# Patient Record
Sex: Female | Born: 1960 | Race: White | Hispanic: No | Marital: Married | Smoking: Never smoker
Health system: Southern US, Community
[De-identification: ages and names within clinical notes are randomized; demographics above are authoritative.]

## PROBLEM LIST (undated history)

## (undated) DIAGNOSIS — J45909 Unspecified asthma, uncomplicated: Secondary | ICD-10-CM

## (undated) DIAGNOSIS — R002 Palpitations: Secondary | ICD-10-CM

## (undated) DIAGNOSIS — R0989 Other specified symptoms and signs involving the circulatory and respiratory systems: Secondary | ICD-10-CM

## (undated) DIAGNOSIS — R0609 Other forms of dyspnea: Secondary | ICD-10-CM

## (undated) DIAGNOSIS — R000 Tachycardia, unspecified: Secondary | ICD-10-CM

## (undated) DIAGNOSIS — Z9889 Other specified postprocedural states: Secondary | ICD-10-CM

## (undated) DIAGNOSIS — E785 Hyperlipidemia, unspecified: Secondary | ICD-10-CM

## (undated) DIAGNOSIS — C801 Malignant (primary) neoplasm, unspecified: Secondary | ICD-10-CM

## (undated) DIAGNOSIS — M940 Chondrocostal junction syndrome [Tietze]: Secondary | ICD-10-CM

## (undated) DIAGNOSIS — R112 Nausea with vomiting, unspecified: Secondary | ICD-10-CM

## (undated) DIAGNOSIS — C4491 Basal cell carcinoma of skin, unspecified: Secondary | ICD-10-CM

## (undated) HISTORY — DX: Tachycardia, unspecified: R00.0

## (undated) HISTORY — DX: Hyperlipidemia, unspecified: E78.5

## (undated) HISTORY — DX: Palpitations: R00.2

## (undated) HISTORY — DX: Basal cell carcinoma of skin, unspecified: C44.91

## (undated) HISTORY — DX: Other specified postprocedural states: Z98.890

## (undated) HISTORY — DX: Malignant (primary) neoplasm, unspecified: C80.1

## (undated) HISTORY — DX: Unspecified asthma, uncomplicated: J45.909

## (undated) HISTORY — PX: COLONOSCOPY: SHX174

## (undated) HISTORY — DX: Other specified symptoms and signs involving the circulatory and respiratory systems: R09.89

## (undated) HISTORY — PX: CERVIX LESION DESTRUCTION: SHX591

## (undated) HISTORY — DX: Other specified symptoms and signs involving the circulatory and respiratory systems: R06.09

## (undated) HISTORY — DX: Other specified postprocedural states: R11.2

## (undated) HISTORY — DX: Chondrocostal junction syndrome (tietze): M94.0

---

## 1985-08-13 HISTORY — PX: TONSILLECTOMY: SUR1361

## 1998-10-27 ENCOUNTER — Encounter: Payer: Self-pay | Admitting: Gynecology

## 1998-10-27 ENCOUNTER — Ambulatory Visit (HOSPITAL_COMMUNITY): Admission: RE | Admit: 1998-10-27 | Discharge: 1998-10-27 | Payer: Self-pay | Admitting: Gynecology

## 1999-06-26 ENCOUNTER — Other Ambulatory Visit: Admission: RE | Admit: 1999-06-26 | Discharge: 1999-06-26 | Payer: Self-pay | Admitting: Gynecology

## 2000-08-12 ENCOUNTER — Other Ambulatory Visit: Admission: RE | Admit: 2000-08-12 | Discharge: 2000-08-12 | Payer: Self-pay | Admitting: Gynecology

## 2000-11-20 ENCOUNTER — Other Ambulatory Visit: Admission: RE | Admit: 2000-11-20 | Discharge: 2000-11-20 | Payer: Self-pay | Admitting: Gynecology

## 2001-05-05 ENCOUNTER — Other Ambulatory Visit: Admission: RE | Admit: 2001-05-05 | Discharge: 2001-05-05 | Payer: Self-pay | Admitting: Gynecology

## 2001-05-09 ENCOUNTER — Other Ambulatory Visit: Admission: RE | Admit: 2001-05-09 | Discharge: 2001-05-09 | Payer: Self-pay | Admitting: Urology

## 2001-05-19 ENCOUNTER — Ambulatory Visit (HOSPITAL_BASED_OUTPATIENT_CLINIC_OR_DEPARTMENT_OTHER): Admission: RE | Admit: 2001-05-19 | Discharge: 2001-05-19 | Payer: Self-pay | Admitting: Plastic Surgery

## 2001-08-13 HISTORY — PX: REFRACTIVE SURGERY: SHX103

## 2001-11-12 ENCOUNTER — Other Ambulatory Visit: Admission: RE | Admit: 2001-11-12 | Discharge: 2001-11-12 | Payer: Self-pay | Admitting: Gynecology

## 2002-08-13 HISTORY — PX: BASAL CELL CARCINOMA EXCISION: SHX1214

## 2003-01-26 ENCOUNTER — Other Ambulatory Visit: Admission: RE | Admit: 2003-01-26 | Discharge: 2003-01-26 | Payer: Self-pay | Admitting: Gynecology

## 2003-08-03 ENCOUNTER — Other Ambulatory Visit: Admission: RE | Admit: 2003-08-03 | Discharge: 2003-08-03 | Payer: Self-pay | Admitting: Gynecology

## 2004-04-25 ENCOUNTER — Other Ambulatory Visit: Admission: RE | Admit: 2004-04-25 | Discharge: 2004-04-25 | Payer: Self-pay | Admitting: Gynecology

## 2004-07-07 ENCOUNTER — Ambulatory Visit (HOSPITAL_COMMUNITY): Admission: RE | Admit: 2004-07-07 | Discharge: 2004-07-07 | Payer: Self-pay | Admitting: Gynecology

## 2005-05-15 ENCOUNTER — Other Ambulatory Visit: Admission: RE | Admit: 2005-05-15 | Discharge: 2005-05-15 | Payer: Self-pay | Admitting: Gynecology

## 2006-06-04 ENCOUNTER — Encounter: Admission: RE | Admit: 2006-06-04 | Discharge: 2006-06-04 | Payer: Self-pay | Admitting: Gynecology

## 2006-06-04 ENCOUNTER — Other Ambulatory Visit: Admission: RE | Admit: 2006-06-04 | Discharge: 2006-06-04 | Payer: Self-pay | Admitting: Gynecology

## 2007-07-30 ENCOUNTER — Encounter: Admission: RE | Admit: 2007-07-30 | Discharge: 2007-07-30 | Payer: Self-pay | Admitting: Gynecology

## 2007-07-30 ENCOUNTER — Other Ambulatory Visit: Admission: RE | Admit: 2007-07-30 | Discharge: 2007-07-30 | Payer: Self-pay | Admitting: Gynecology

## 2008-08-11 ENCOUNTER — Other Ambulatory Visit: Admission: RE | Admit: 2008-08-11 | Discharge: 2008-08-11 | Payer: Self-pay | Admitting: Gynecology

## 2008-09-23 ENCOUNTER — Ambulatory Visit: Payer: Self-pay | Admitting: Cardiology

## 2008-09-29 ENCOUNTER — Ambulatory Visit: Payer: Self-pay | Admitting: Cardiology

## 2008-09-30 ENCOUNTER — Ambulatory Visit (HOSPITAL_COMMUNITY): Admission: RE | Admit: 2008-09-30 | Discharge: 2008-09-30 | Payer: Self-pay | Admitting: Cardiology

## 2008-09-30 ENCOUNTER — Encounter: Payer: Self-pay | Admitting: Cardiology

## 2008-09-30 ENCOUNTER — Ambulatory Visit: Payer: Self-pay | Admitting: Cardiovascular Disease

## 2008-10-05 ENCOUNTER — Ambulatory Visit: Payer: Self-pay | Admitting: Cardiology

## 2008-11-19 DIAGNOSIS — R0609 Other forms of dyspnea: Secondary | ICD-10-CM

## 2008-11-19 DIAGNOSIS — R0989 Other specified symptoms and signs involving the circulatory and respiratory systems: Secondary | ICD-10-CM | POA: Insufficient documentation

## 2008-11-19 DIAGNOSIS — R000 Tachycardia, unspecified: Secondary | ICD-10-CM | POA: Insufficient documentation

## 2008-11-19 DIAGNOSIS — R002 Palpitations: Secondary | ICD-10-CM | POA: Insufficient documentation

## 2008-11-22 ENCOUNTER — Encounter: Payer: Self-pay | Admitting: Cardiology

## 2008-11-22 ENCOUNTER — Ambulatory Visit: Payer: Self-pay | Admitting: Cardiology

## 2009-03-31 ENCOUNTER — Encounter: Admission: RE | Admit: 2009-03-31 | Discharge: 2009-03-31 | Payer: Self-pay | Admitting: Gynecology

## 2009-07-01 ENCOUNTER — Ambulatory Visit (HOSPITAL_BASED_OUTPATIENT_CLINIC_OR_DEPARTMENT_OTHER): Admission: RE | Admit: 2009-07-01 | Discharge: 2009-07-01 | Payer: Self-pay | Admitting: Orthopedic Surgery

## 2010-01-31 ENCOUNTER — Encounter: Admission: RE | Admit: 2010-01-31 | Discharge: 2010-01-31 | Payer: Self-pay | Admitting: Gynecology

## 2010-06-08 ENCOUNTER — Encounter: Payer: Self-pay | Admitting: Cardiology

## 2010-06-15 ENCOUNTER — Encounter: Admission: RE | Admit: 2010-06-15 | Discharge: 2010-06-15 | Payer: Self-pay | Admitting: Gynecology

## 2010-09-23 ENCOUNTER — Emergency Department (HOSPITAL_COMMUNITY)
Admission: EM | Admit: 2010-09-23 | Discharge: 2010-09-23 | Disposition: A | Discharge status: Home / Self Care | Payer: Commercial Managed Care - PPO | Attending: Emergency Medicine | Admitting: Emergency Medicine

## 2010-09-23 ENCOUNTER — Emergency Department (HOSPITAL_COMMUNITY): Payer: Commercial Managed Care - PPO

## 2010-09-23 DIAGNOSIS — Z79899 Other long term (current) drug therapy: Secondary | ICD-10-CM | POA: Insufficient documentation

## 2010-09-23 DIAGNOSIS — R000 Tachycardia, unspecified: Secondary | ICD-10-CM | POA: Insufficient documentation

## 2010-09-23 DIAGNOSIS — R0602 Shortness of breath: Secondary | ICD-10-CM | POA: Insufficient documentation

## 2010-09-23 DIAGNOSIS — R079 Chest pain, unspecified: Secondary | ICD-10-CM | POA: Insufficient documentation

## 2010-09-23 LAB — POCT CARDIAC MARKERS
Myoglobin, poc: 95.8 ng/mL (ref 12–200)
Myoglobin, poc: 61.8 ng/mL (ref 12–200)

## 2010-09-23 LAB — DIFFERENTIAL
Basophils Absolute: 0 10*3/uL (ref 0.0–0.1)
Lymphs Abs: 1.8 10*3/uL (ref 0.7–4.0)
Neutro Abs: 5.3 10*3/uL (ref 1.7–7.7)
Neutrophils Relative %: 69 % (ref 43–77)

## 2010-09-23 LAB — BASIC METABOLIC PANEL
CO2: 21 mEq/L (ref 19–32)
Creatinine, Ser: 0.92 mg/dL (ref 0.4–1.2)
GFR calc Af Amer: >60 mL/min (ref >60)
Potassium: 4 mEq/L (ref 3.5–5.1)
Sodium: 138 mEq/L (ref 135–145)

## 2010-09-23 LAB — CBC
HCT: 37.4 % (ref 36.0–46.0)
MCH: 30.6 pg (ref 26.0–34.0)
MCV: 88.6 fL (ref 78.0–100.0)
Platelets: 290 10*3/uL (ref 150–400)
RBC: 4.22 MIL/uL (ref 3.87–5.11)
RDW: 12.6 % (ref 11.5–15.5)
WBC: 7.7 10*3/uL (ref 4.0–10.5)

## 2010-09-23 LAB — URINALYSIS, ROUTINE W REFLEX MICROSCOPIC
Leukocytes, UA: NEGATIVE
Nitrite: NEGATIVE
Urine Glucose, Fasting: NEGATIVE mg/dL
Urobilinogen, UA: 0.2 mg/dL (ref 0.0–1.0)
pH: 6 (ref 5.0–8.0)

## 2010-09-23 LAB — URINE MICROSCOPIC-ADD ON

## 2010-09-23 IMAGING — CR DG CHEST 2V
2 series · 2 of 2 positions shown · non-contrast
Comparison: None

CLINICAL DATA: Cough and chest pain.

CHEST - 2 VIEW

[w chest pa]
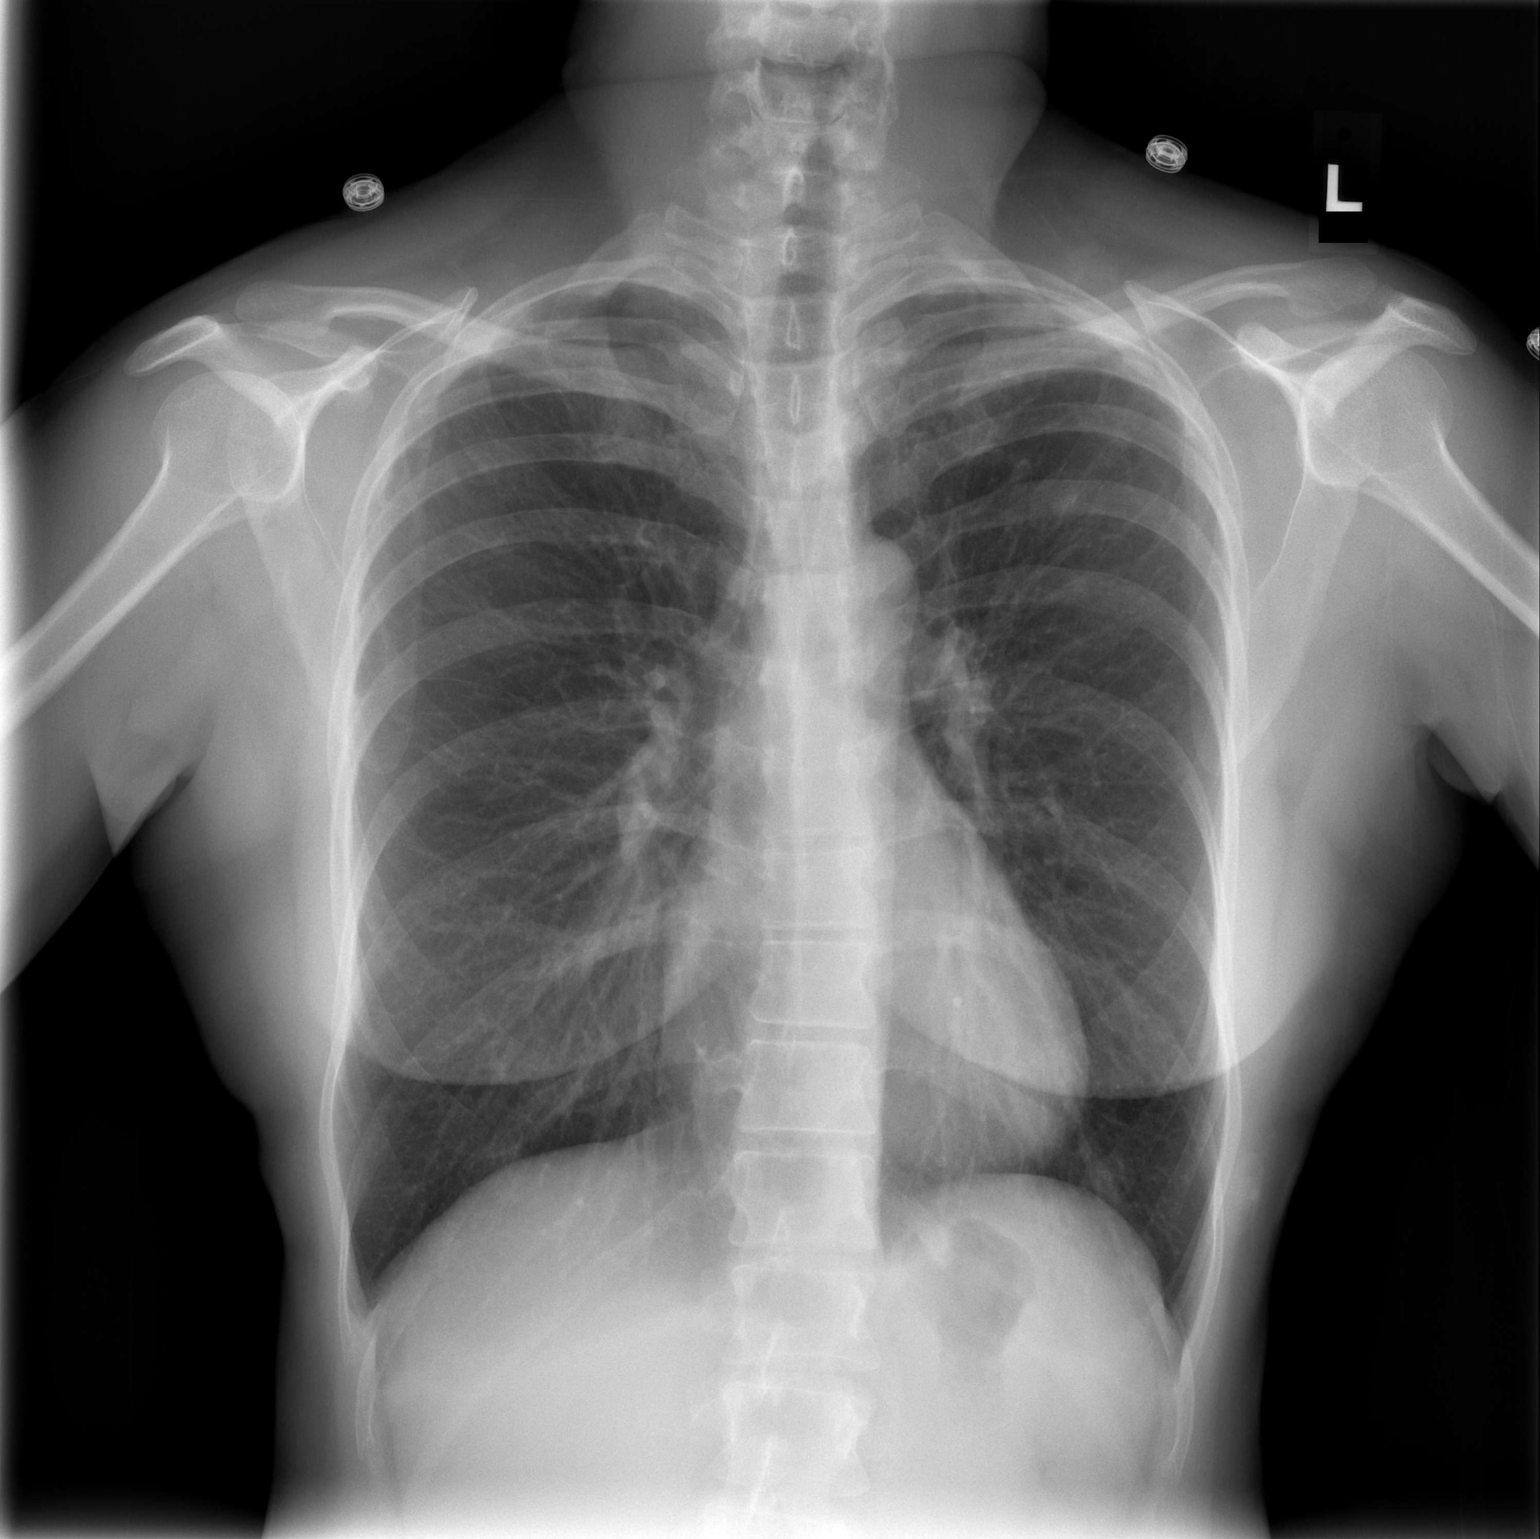

[w chest lat]
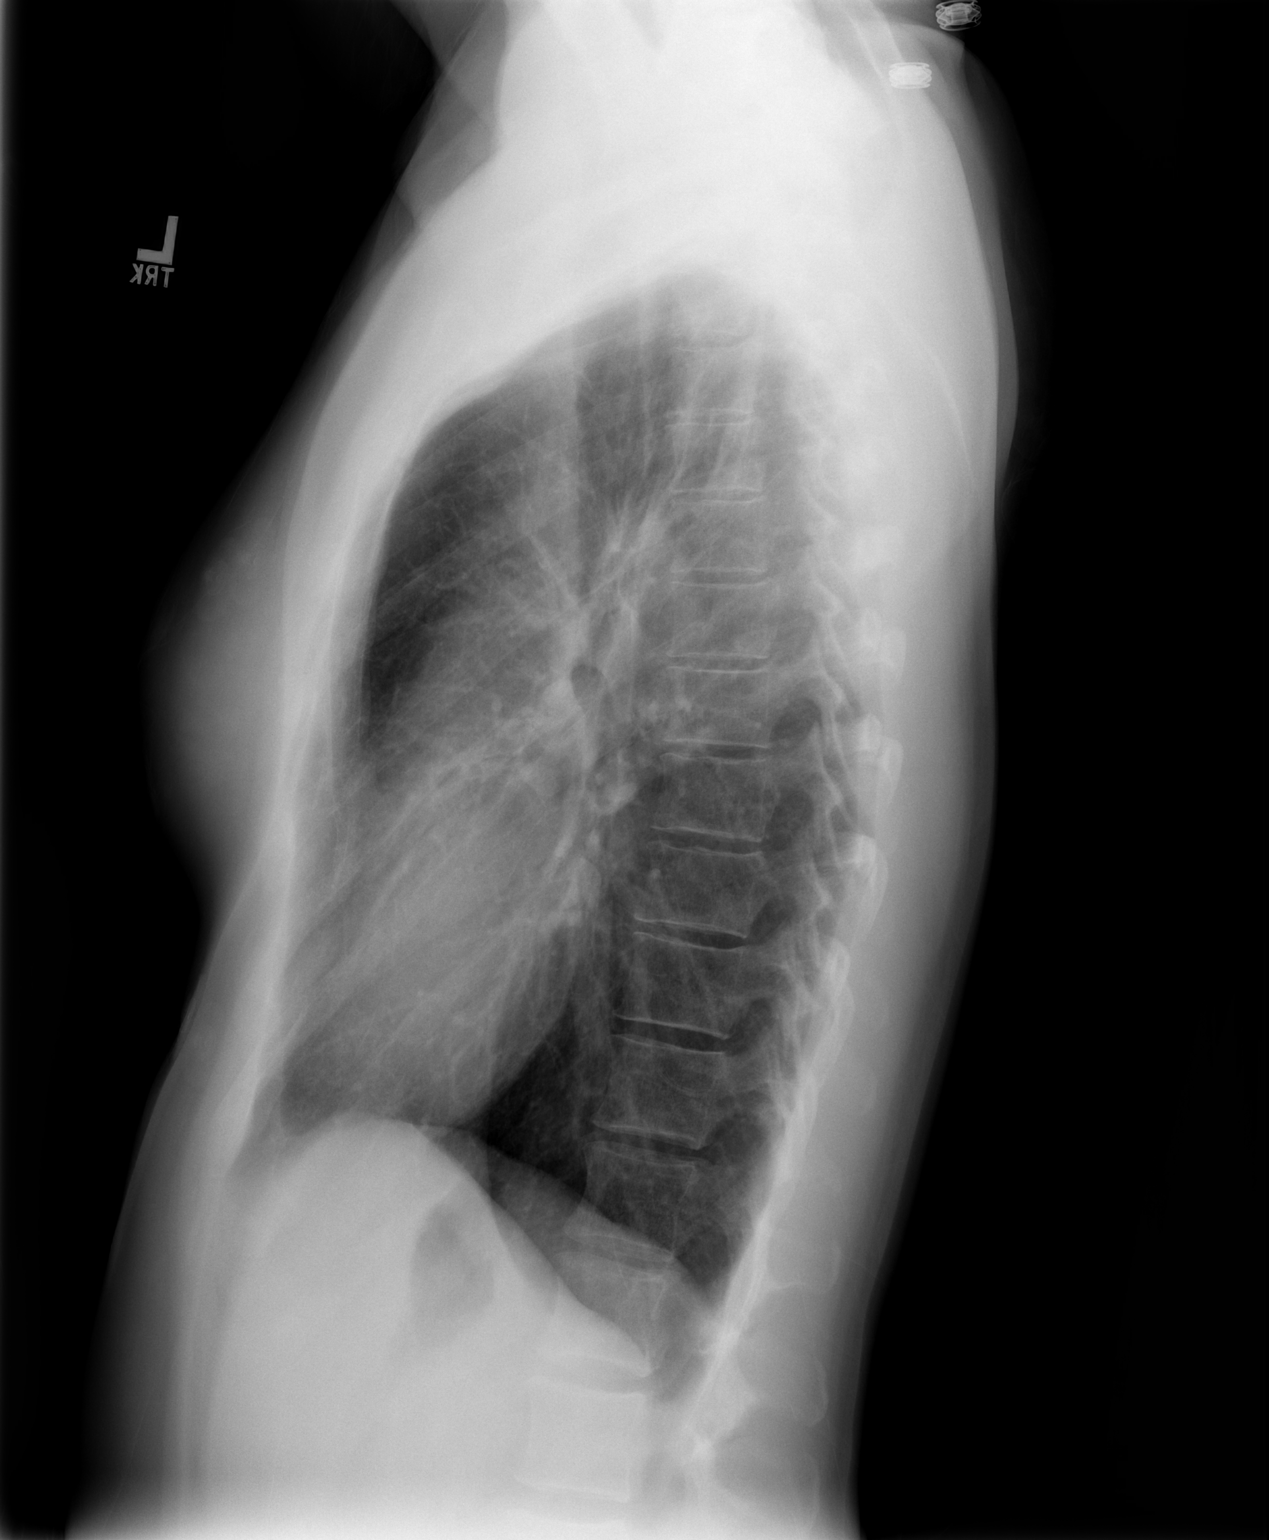

[2 of 2 positions shown; findings below may reference images not displayed]

FINDINGS: The cardiac silhouette, mediastinal and hilar contours
are within normal limits.  A mild pectus deformity is noted.  The
lungs demonstrate mild hyperinflation.  No acute pulmonary
findings.  Artifact from overlying EKG leads are noted.  The bony
thorax is intact.
IMPRESSION: No acute cardiopulmonary findings.

## 2010-09-25 ENCOUNTER — Encounter: Payer: Self-pay | Admitting: Cardiology

## 2010-09-25 ENCOUNTER — Ambulatory Visit (INDEPENDENT_AMBULATORY_CARE_PROVIDER_SITE_OTHER): Payer: Commercial Managed Care - PPO | Admitting: Cardiology

## 2010-09-25 DIAGNOSIS — R002 Palpitations: Secondary | ICD-10-CM

## 2010-09-25 DIAGNOSIS — E785 Hyperlipidemia, unspecified: Secondary | ICD-10-CM | POA: Insufficient documentation

## 2010-10-04 NOTE — Assessment & Plan Note (Signed)
Summary: tachycardia/mt   Primary Provider:  [name], MD  CC:  tachycardia.  Pt went to ED on Saturday.  Pt EKG showed tachycardia.  History of Present Illness: 50 yo with inappropriate sinus tachycardia returns for office followup. She did well for about a year on atenolol, then tapered off it.  After stopping atenolol in 12/11, she her heart rate rose again to the 100s. She went to the ER in 2/12 with acute bronchitis and tachypalpitations.  HR was sinus tachycardia at 127 and decreased to the 90s with hydration.  HR today in the 90s.  No significant exertional dyspnea.  She exercises on an elliptical.  No chest pain.   ECG: NSR, biatrial enlargement  Labs (10/11): LDL 137, HDL 54 Labs (2/12): K 4, creatinine 0.92  Current Medications (verified): 1)  Red Yeast Rice 2)  Multivitamin .... Once Daily 3)  Sudafed 30 Mg Tabs (Pseudoephedrine Hcl) .... As Needed 4)  Vitamin C 1000 Mg Tabs (Ascorbic Acid) .... Once Daily 5)  Vitamin D3 2000 Unit Caps (Cholecalciferol) .... Take One Capsule Once Daily 6)  Calcium 600 600 Mg Tabs (Calcium Carbonate) .... Take One Tablet Once Daily 7)  Vitamin B-6 250 Mg Tabs (Pyridoxine Hcl) .... Take One Tablet Once Daily 8)  Nuvaring 0.12-0.015 Mg/24hr Ring (Etonogestrel-Ethinyl Estradiol) .... Uad  Allergies (verified): No Known Drug Allergies  Past History:  Past Medical History: 1. Inappropriate sinus tachycardia: Holter monitor showed an average heart rate of 107.  Her heart rate was in the 90s-100s while she was asleep and in the 110s while she was awake.  PACs with no significant arrhythmia.  2. Echo (2/10): EF 60%, normal study.  3. Hyperlipidemia  Family History: Reviewed history from 11/19/2008 and no changes required. No premature coronary artery disease or congenital heart disease.   Social History: Reviewed history from 11/22/2008 and no changes required. 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  Alcohol Use - no Drug Use - no  Review of Systems       All systems reviewed and negative except as per HPI.   Vital Signs:  Patient profile:   50 year old female Height:      64 inches Weight:      126 pounds BMI:     21.71 Pulse rate:   102 / minute Pulse rhythm:   regular BP sitting:   122 / 76  (left arm) Cuff size:   regular  Vitals Entered By: Judithe Modest CMA (September 25, 2010 5:03 PM)  Physical Exam  General:  Well developed, well nourished, in no acute distress. Neck:  Neck supple, no JVD. No masses, thyromegaly or abnormal cervical nodes. Lungs:  Clear bilaterally to auscultation and percussion. Heart:  Non-displaced PMI, chest non-tender; regular rate and rhythm, S1, S2 without murmurs, rubs or gallops. Carotid upstroke normal, no bruit. Pedals normal pulses. No edema, no varicosities. Abdomen:  Bowel sounds positive; abdomen soft and non-tender without masses, organomegaly, or hernias noted. No hepatosplenomegaly. Extremities:  No clubbing or cyanosis. Neurologic:  Alert and oriented x 3. Psych:  Normal affect.   Impression & Recommendations:  Problem # 1:  TACHYCARDIA (ICD-785.0) Inappropriate sinus tachycardia.  I suggested that she restart atenolol 12.5 mg daily as she has done well with this in the past.    Problem # 2:  HYPERLIPIDEMIA-MIXED (ICD-272.4) Elevated LDL.  Patient is on red yeast rice extract.   Patient Instructions: 1)  Your  physician has recommended you make the following change in your medication:  2)  Take Atenolol 12.5mg  daily--this will be one-half of a 25mg  tablet. 3)  Take Fish Oil 1000mg  twice a day. 4)  Take your pulse and I will call you in 2 weeks to get the readings. Luana Shu  5)  Your physician wants you to follow-up in: 1 year with Dr Shirlee Latch.Priscille Loveless 2013)  You will receive a reminder letter in the mail two months in advance. If you don't receive a letter, please call our office to schedule the follow-up  appointment. Prescriptions: ATENOLOL 25 MG TABS (ATENOLOL) one half tablet daily  #45 x 3   Entered by:   Katina Dung, RN, BSN   Authorized by:   Marca Ancona, MD   Signed by:   Katina Dung, RN, BSN on 09/25/2010   Method used:   Electronically to        [facility]* (retail)       [address]       [address] Shipping/mailing       [place], [place]  [address]       Ph: [phone]       Fax: [phone]   RxID:   [id number]

## 2010-10-10 ENCOUNTER — Telehealth: Payer: Self-pay | Admitting: Cardiology

## 2010-10-19 NOTE — Progress Notes (Addendum)
Summary: pulse readings  Phone Note Outgoing Call   Call placed by: Katina Dung, RN, BSN,  October 10, 2010 9:24 AM Call placed to: Patient  Follow-up for Phone Call        Atenolol 12.5mg  daily started 09/25/10--call and get pulse readings --pt is at a conference until Friday--Anne Lankford, RN, BSN  October 10, 2010 1:55 PM --pt left a message that her heart rate has been averaging 76-86-I will forward to Dr Shirlee Latch for review     Appended Document: pulse readings that is ok  Appended Document: pulse readings I talked with patient

## 2010-11-15 LAB — POCT I-STAT, CHEM 8
Creatinine, Ser: 0.9 mg/dL (ref 0.4–1.2)
Glucose, Bld: 87 mg/dL (ref 70–99)
Hemoglobin: 13.6 g/dL (ref 12.0–15.0)
Potassium: 4.1 mEq/L (ref 3.5–5.1)

## 2010-12-26 NOTE — Assessment & Plan Note (Signed)
[facility]                            CARDIOLOGY OFFICE NOTE   MASHAWN, BRAZIL                       MRN:          [id number]  DATE:09/23/2008                            DOB:          [birth year]    PRIMARY CARE PHYSICIAN:  [name], MD.   HISTORY OF PRESENT ILLNESS:  This is a 50 year old with no significant  past medical history who presents with persistent sinus tachycardia.  The patient does state that she developed a respiratory illness  involving sinus congestion in January 2010 and that her symptoms  lingered.  She went to see her primary care physician and received  azithromycin and Sudafed which she used and eventually she also came  back for a prednisone Dosepak.  Around September 01, 2008, after she had  been taking these medications, she noted that her heart was racing and  that she was having occasional skipped beats.  She went to see back to  her primary care physician's office and was noted to be in sinus  tachycardia with occasional PACs and PVCs.  This was thought to be  probably due to the cold medicines that she was taking including the  Sudafed.  She cut out her caffeine.  However, heart rate has remained  high since that time.  She went back to see her primary care physician  last week.  She was still in sinus tachycardia with a rate in in the  120s-130s, and the tachycardia has not resolved despite being off  caffeine for about 2 weeks now.  She is no longer taking any cold  medications.  In fact, the only medicine she is taking are  multivitamins.  She does not use illicit drugs.  She otherwise feels  well.  She is recovered from her sinusitis.  She says she has some mild  shortness of breath after climbing a flight of steps, but she does not  think this is necessarily and may have been the same prior to her cold  and the development of tachycardia.  She had no orthopnea or PND.  No  chest pain.  She is not dehydrated.   She states she has been eating and  drinking well.  Her hematocrit is normal and her TSH and free T4 are  also normal.   PAST MEDICAL HISTORY:  There is no significant past medical history.   SOCIAL HISTORY:  The patient does not smoke, does not use illicit drugs.  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   FAMILY HISTORY:  There is no premature coronary artery disease or  congenital heart disease.   MEDICATIONS:  Multivitamin.   REVIEW OF SYSTEMS:  Negative except as noted in the history present  illness.   EKG was reviewed today shows sinus tachycardia, rate of 125.  There does  appear to be biatrial enlargement.  There is an RSR prime pattern  without formal criteria for an incomplete right bundle-branch block or  right bundle-branch block.  There is a rightward axis.  Labs; hematocrit  38.5, TSH normal, free T4 normal,  and creatinine 0.86.   PHYSICAL EXAMINATION:  VITAL SIGNS:  Blood pressure is 110/85, heart  rate is 125 and regular.  GENERAL:  This is a well-developed female in no apparent distress.  NEUROLOGIC:  Alert and oriented x3.  Normal affect.  LUNGS:  Clear to auscultation bilaterally.  Normal respiratory effort.  CARDIOVASCULAR:  Heart is tachy and regular.  No S3.  No S4.  No murmur.  ABDOMEN:  Soft and nontender.  No hepatosplenomegaly.  EXTREMITIES:  There is no clubbing or cyanosis.  There is no peripheral  edema.  There are 2+ posterior tibial pulses bilaterally.  There is no  carotid bruit.  HEENT:  Normal exam.  SKIN:  Normal exam.  NECK:  No JVD.  No thyromegaly or thyroid nodule.  MUSCULOSKELETAL:  Normal exam.   ASSESSMENT AND PLAN:  This is a 50 year old with no significant past  medical history who presents to Cardiology Clinic for evaluation of  persistent tachycardia.  The patient's tachycardia did initially develop  in the setting of a upper respiratory illness where she was taking  Sudafed, prednisone, and azithromycin.   However, even after going off  her medications and stopping caffeine, her heart rate continues to  remain high.  She says that sometimes when she is at rest, it will dip  down between 95 and 100, but most of time it has been running in the  120s.  She has been off caffeine for 2 weeks.  Her blood count is  normal.  Her thyroid function is normal.  She has not dehydrated.  There  is no immediately obvious reason that I can find for her tachycardia.  One thing, we will check a D-dimer to make sure there is no evidence for  pulmonary embolus given the rightward axis on the EKG and the RSR'  pattern though she has noted nothing in the history that would  predispose her towards a pulmonary embolus.  I think this is going to be  unlikely.  I do think that it would be reasonable to do an  echocardiogram.  I would like to ensure that she has a structurally  normal heart. One worry would be that she developed myocarditis with  depressed LV function associated with the upper respiratory illness.  We  will check an echocardiogram to make sure her left ventricular function  is normal.  We will also do a Holter monitor just to see where her  average heart rate is running.  If all these tests come back normal, she  will be left with the diagnosis of inappropriate sinus tachycardia due  to autonomic dysfunction.  One other concern would be sinus node reentry  tachycardia.  I did do a carotid massage today, which did not change her  heart rate at all.     Marca Ancona, MD  Electronically Signed    DM/MedQ  DD: 09/23/2008  DT: 09/23/2008  Job #: [id number]   cc:   [name], M.D.

## 2010-12-26 NOTE — Assessment & Plan Note (Signed)
[facility]                            CARDIOLOGY OFFICE NOTE   Barbara Carlson, Barbara Carlson                       MRN:          [id number]  DATE:10/05/2008                            DOB:          02-03-1961    PRIMARY CARE PHYSICIAN:  Barbara Peat, MD   HISTORY OF PRESENT ILLNESS:  This is a 50 year old with no significant  past medical history who presents with persistent sinus tachycardia.  We  first saw her back in early February and are seeing her back after an  echo and her Holter monitor.  The patient did develop a respiratory  illness involving sinus congestion in January 2010 with lingering  symptoms.  She received azithromycin, Sudafed, and eventually prednisone  dose pack.  After being on these medications, she noted that her heart  was racing and she was having occasional skipped beats.  She was seen by  her primary care physician's office and noted to be in sinus tachycardia  with occasional PACs and PVCs.  It was thought at that time that this  was probably due to her cold medicines she was taking including her  Sudafed, so she quit taking them and she cut back on caffeine.  However,  her heart rate has remained high ever since.  When I last saw her in the  office, her heart rate was in the 120s.  Today, her heart rate is in the  100s-110s and tachycardia is not resolving.  Hematocrit was normal.  Thyroid function was normal.  D-dimer was normal.  She had an  echocardiogram done that was completely normal.  We did do a carotid  sinus massage in the effort to detect any evidence for sinus node  reentry tachycardia; however, there was no effect on her heart rate with  carotid massage.  We did do a Holter monitor showing an average heart  rate of 107.  Her heart rate was in the 90s-100s while she was asleep  and in the 110s while she was awake.   PAST MEDICAL HISTORY:  Inappropriate sinus tachycardia.   SOCIAL HISTORY:  The patient does not  smoke.  She does not use any  illicit drugs.  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   FAMILY HISTORY:  No premature coronary artery disease or congenital  heart disease.   MEDICATIONS:  1. Multivitamin.  2. Red yeast rice extract.   Echocardiogram, this was done in February 2010, EF was 60%, there is  normal wall motion, and this was a normal study.   Holter monitor in February 2010 average heart rate was 107, maximum was  169, sinus tachycardia, average heart rate while awake was in the 110s-  120s, average while asleep was in the 90s-100s.  There are rare PACs and  PVCs.  There are no more significant  arrhythmias.   LABORATORY DATA:  Hematocrit 38.5, TSH normal, free T4 normal,  creatinine 0.86.  D-dimer normal.   PHYSICAL EXAMINATION:  VITAL SIGNS:  Heart rate 112 and regular, weight  is 130 pounds, blood pressure is 112/54.  GENERAL:  This is a well-developed female, in no apparent distress.  NEUROLOGIC:  Alert and oriented x3.  Normal affect.  LUNGS:  Clear to auscultation bilaterally with normal respiratory  effort.  CARDIOVASCULAR:  Heart, regular S1 and S2.  No S3, no S4.  No murmur.  ABDOMEN:  Soft, nontender.  No hepatosplenomegaly.  NECK:  No JVD, thyromegaly, or thyroid nodule.   ASSESSMENT AND PLAN:  This is a 50 year old with no significant past  medical history who presents to the Cardiology Clinic for evaluation of  persistent tachycardia.  The patient does have sinus tachycardia.  So  far, no definite cause has been found.  She was on cold medicines  including Sudafed in January; however, she has been off the cold  medicines and off all caffeine products since then and she continues to  be tachycardic.  Her hemoglobin level is normal.  Her TSH and free T4  are normal.  Her D-dimer is normal.  She did have an echocardiogram done  that was essentially completely normal and she did a Holter monitor  showing sinus tachycardia with no  other significant arrhythmias.  We did  do a cardiac massage last time she was in the office and there was no  resolution of the tachycardia; therefore, no evidence for a sinoatrial  nodal reentry tachycardia.  The patient denies any symptoms of  lightheadedness or dizziness when standing; therefore, a postural  orthostatic tachycardia syndrome is unlikely.  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  I did tell her that it may help  to increase her exercise level.  She is going to try to do that over the  next couple of weeks and then I also told her that I did not want her  heart rate chronically running in the 110s, that does put her at some  risk of tachycardia-mediated cardiomyopathy.  I will give her a  prescription for atenolol 12.5 mg once a day.  If her heart rate does  not come down after she has increased her activity level at home then I  think she should start taking the atenolol 12.5 mg a day and we will  titrate that as needed.  I will see her back in 2 months and we will see  if she has made any  progress.     Barbara Ancona, MD  Electronically Signed    DM/MedQ  DD: 10/05/2008  DT: 10/06/2008  Job #: [id number]   cc:   Barbara Carlson, M.D.

## 2010-12-29 NOTE — Op Note (Signed)
[place]  Patient:    ALAZNE, QUANT Visit Number: [id number] MRN: [id number]          Service Type: DSU Location: [facility] Attending Physician:  Chapman Moss Dictated by:   Teena Irani. Odis Luster, M.D. Proc. Date: 05/19/01 Admit Date:  05/19/2001                             Operative Report  PREOPERATIVE DIAGNOSIS:  A very large basal cell carcinoma of the right flank greater than 4.0 cm.  POSTOPERATIVE DIAGNOSIS: 1. A very large basal cell carcinoma of the right flank greater than 4.0 cm. 2. Complex open wound greater than 8.0 cm resulting from a cancer excision.  OPERATION PERFORMED: 1. A complex wound closure right flank resulting from a cancer excision that    is greater than 8.0 cm in length. 2. Excision of a very large basal cell carcinoma greater than 4.0 cm as a    distinct procedure.  SURGEON:  Teena Irani. Odis Luster, M.D.  ANESTHESIA:  MAC with 1% Xylocaine with epinephrine plus bicarb as well as some 0.25% Marcaine plain.  INDICATIONS FOR PROCEDURE:  The patient is a 50 year old woman with a very large basal cell carcinoma of the right flank that has been biopsy proven by her dermatologist.  She presents for a wide excision of this lesion.  The nature of the procedure and the risks were well understood by her including the possibility of further surgery depending on final pathology report.  She understood the risks of wound healing problems and skin loss and wished to proceed.  DESCRIPTION OF PROCEDURE:  The patient was taken to the operating room and placed in the left lateral decubitus position.  After sedation she was prepped with Betadine and draped with sterile drapes.  The elliptical excision was marked in the direction of the relaxed skin tension lines and satisfactory local anesthesia was achieved using 1% Xylocaine with epinephrine plus bicarb. The elliptical excision was performed.  The wound edges were undermined  in order to achieve closure of this very large wound.  Thorough irrigation with saline.  Meticulous hemostasis having been confirmed, the layered closure was then performed using 3-0 Vicryl interrupted inverted deep sutures and 3-0 Monocryl running subcuticular suture.  Just prior the closure 0.25% Marcaine plain was infiltrated on the wound edges to provide relief of discomfort for a short period of time this afternoon.  DISPOSITION:  Keflex 500 mg p.o. q.i.d., Vicodin one every four hours p.r.n. for pain with a dressing placed for three days and then removed and may shower.  No lifting or vigorous activities.  Recheck in the office next week. Dictated by:   Teena Irani. Odis Luster, M.D. Attending Physician:  Chapman Moss DD:  05/19/01 TD:  05/19/01 Job: [id number]

## 2011-04-08 LAB — HM MAMMOGRAPHY: HM Mammogram: NEGATIVE

## 2011-06-29 ENCOUNTER — Other Ambulatory Visit: Payer: Self-pay | Admitting: Gynecology

## 2011-06-29 DIAGNOSIS — Z1231 Encounter for screening mammogram for malignant neoplasm of breast: Secondary | ICD-10-CM

## 2011-07-26 ENCOUNTER — Ambulatory Visit
Admission: RE | Admit: 2011-07-26 | Discharge: 2011-07-26 | Disposition: A | Discharge status: Home / Self Care | Payer: Commercial Managed Care - PPO | Source: Ambulatory Visit | Attending: Gynecology | Admitting: Gynecology

## 2011-07-26 DIAGNOSIS — Z1231 Encounter for screening mammogram for malignant neoplasm of breast: Secondary | ICD-10-CM

## 2011-09-18 ENCOUNTER — Other Ambulatory Visit: Payer: Self-pay | Admitting: *Deleted

## 2011-09-18 ENCOUNTER — Telehealth: Payer: Self-pay | Admitting: Cardiology

## 2011-09-18 MED ORDER — ATENOLOL 25 MG PO TABS: 12.5000 mg | ORAL_TABLET | Freq: Every day | ORAL | Status: DC

## 2011-09-18 NOTE — Telephone Encounter (Signed)
New msg: pt needs refill of atenolol 25 mg; 1/2 tablet one time per day. Pt would like 30 day supply called into [facility] to last pt until she sees Dr. Shirlee Latch in March.

## 2011-10-16 ENCOUNTER — Encounter: Payer: Self-pay | Admitting: *Deleted

## 2011-10-17 ENCOUNTER — Encounter: Payer: Self-pay | Admitting: Cardiology

## 2011-10-17 ENCOUNTER — Ambulatory Visit (INDEPENDENT_AMBULATORY_CARE_PROVIDER_SITE_OTHER): Payer: Commercial Managed Care - PPO | Admitting: Cardiology

## 2011-10-17 VITALS — BP 122/79 | HR 103 | Ht 64.0 in | Wt 129.8 lb

## 2011-10-17 DIAGNOSIS — R000 Tachycardia, unspecified: Secondary | ICD-10-CM

## 2011-10-17 DIAGNOSIS — E785 Hyperlipidemia, unspecified: Secondary | ICD-10-CM

## 2011-10-17 DIAGNOSIS — R002 Palpitations: Secondary | ICD-10-CM

## 2011-10-17 DIAGNOSIS — E782 Mixed hyperlipidemia: Secondary | ICD-10-CM

## 2011-10-17 MED ORDER — ATENOLOL 25 MG PO TABS: 12.5000 mg | ORAL_TABLET | Freq: Every day | ORAL | Status: DC

## 2011-10-17 NOTE — Assessment & Plan Note (Signed)
Probable mild inappropriate sinus tachycardia.  She has been doing well recently with minimal tachypalpitations.  EF normal on prior echo.  Continue current atenolol dose.

## 2011-10-17 NOTE — Assessment & Plan Note (Signed)
Check lipids today 

## 2011-10-17 NOTE — Patient Instructions (Signed)
Your physician recommends that you return for lab work in: fasting on Wed, 10/24/11 at 8:30am (bmet, liver, lipid)  Your physician recommends that you schedule a follow-up appointment in: as needed

## 2011-10-17 NOTE — Progress Notes (Signed)
PCP: Dr. Caryl Never  51 yo with inappropriate sinus tachycardia returns for office followup. Since last appointment, she has been doing well.  No significant tachypalpitations, lightheadedness, or syncope.  She has been on her current atenolol dose for a long time now with no problems.  No significant exertional dyspnea. She exercises on an elliptical. No chest pain.  ECG: NSR, rSR' pattern  Labs (10/11): LDL 137, HDL 54  Labs (2/12): K 4, creatinine 0.92    Allergies (verified):  No Known Drug Allergies   Past Medical History:  1. Inappropriate sinus tachycardia: Holter monitor showed an average heart rate of 107. Her heart rate was in the 90s-100s while she was asleep and in the 110s while she was awake. PACs with no significant arrhythmia.  2. Echo (2/10): EF 60%, normal study.  3. Hyperlipidemia   Family History:  No premature coronary artery disease or congenital heart disease.   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  Alcohol Use - no  Drug Use - no   Current Outpatient Prescriptions  Medication Sig Dispense Refill  . Ascorbic Acid (VITAMIN C) 1000 MG tablet Take 500 mg by mouth daily.       [name] atenolol (TENORMIN) 25 MG tablet Take 0.5 tablets (12.5 mg total) by mouth daily.  45 tablet  3  . calcium carbonate (OS-CAL) 600 MG TABS Take 600 mg by mouth daily.      . cetirizine (ZYRTEC) 10 MG tablet Take 10 mg by mouth daily.      . Cholecalciferol (VITAMIN D3) 2000 UNITS TABS Take by mouth.      . Cyanocobalamin (VITAMIN B-12 PO) Take 2,500 mcg by mouth.      . etonogestrel-ethinyl estradiol (NUVARING) 0.12-0.015 MG/24HR vaginal ring Place 1 each vaginally every 28 (twenty-eight) days. Insert vaginally and leave in place for 3 consecutive weeks, then remove for 1 week.      [name] KRILL OIL PO Take by mouth daily.      . Multiple Vitamin (MULTI-VITAMIN PO) Take by mouth daily.      . pseudoephedrine (SUDAFED) 30 MG tablet Take 30  mg by mouth as needed.      . Red Yeast Rice Extract (RED YEAST RICE PO) Take by mouth.      [name] VITAMIN E PO Take by mouth daily. 400 IU        BP 122/79  Pulse 103  Ht 5\' 4"  (1.626 m)  Wt 129 lb 12.8 oz (58.877 kg)  BMI 22.28 kg/m2 General: NAD Neck: No JVD, no thyromegaly or thyroid nodule.  Lungs: Clear to auscultation bilaterally with normal respiratory effort. CV: Nondisplaced PMI.  Heart regular S1/S2, no S3/S4, no murmur.  No peripheral edema.  No carotid bruit.  Normal pedal pulses.  Abdomen: Soft, nontender, no hepatosplenomegaly, no distention.  Neurologic: Alert and oriented x 3.  Psych: Normal affect. Extremities: No clubbing or cyanosis.

## 2011-10-24 ENCOUNTER — Other Ambulatory Visit (INDEPENDENT_AMBULATORY_CARE_PROVIDER_SITE_OTHER): Payer: Commercial Managed Care - PPO

## 2011-10-24 DIAGNOSIS — E782 Mixed hyperlipidemia: Secondary | ICD-10-CM

## 2011-10-24 LAB — HEPATIC FUNCTION PANEL
Alkaline Phosphatase: 42 U/L (ref 39–117)
Bilirubin, Direct: 0 mg/dL (ref 0.0–0.3)
Total Bilirubin: 0.3 mg/dL (ref 0.3–1.2)
Total Protein: 7.3 g/dL (ref 6.0–8.3)

## 2011-10-24 LAB — LIPID PANEL: VLDL: 62 mg/dL — ABNORMAL HIGH (ref 0.0–40.0)

## 2011-10-25 ENCOUNTER — Telehealth: Payer: Self-pay | Admitting: *Deleted

## 2011-10-25 DIAGNOSIS — E785 Hyperlipidemia, unspecified: Secondary | ICD-10-CM

## 2011-10-25 MED ORDER — ATORVASTATIN CALCIUM 10 MG PO TABS: 10.0000 mg | ORAL_TABLET | Freq: Every day | ORAL | Status: DC

## 2011-10-25 NOTE — Telephone Encounter (Signed)
Notes Recorded by Jacqlyn Krauss, RN on 10/25/2011 at 6:34 PM Talked with pt. Pt agreed to start atorvastatin 10mg  daily and fish oil 2000mg  daily. She will return for fasting lipid/liver/BMET(not done 10/24/11) 12/24/11. ------  Notes Recorded by Laurey Morale, MD on 10/25/2011 at 7:54 AM LDL and triglycerides both rather high. Would suggest atorvastatin 10 mg daily + fish oil 2000 mg daily. Lipids/LFTs in 2 months.

## 2011-12-24 ENCOUNTER — Other Ambulatory Visit: Payer: Commercial Managed Care - PPO

## 2012-01-01 ENCOUNTER — Other Ambulatory Visit (INDEPENDENT_AMBULATORY_CARE_PROVIDER_SITE_OTHER): Payer: Commercial Managed Care - PPO

## 2012-01-01 DIAGNOSIS — E785 Hyperlipidemia, unspecified: Secondary | ICD-10-CM

## 2012-01-01 LAB — BASIC METABOLIC PANEL
CO2: 25 mEq/L (ref 19–32)
Chloride: 107 mEq/L (ref 96–112)
Sodium: 139 mEq/L (ref 135–145)

## 2012-01-01 LAB — LIPID PANEL
Cholesterol: 209 mg/dL — ABNORMAL HIGH (ref 0–200)
HDL: 48.8 mg/dL (ref >39.00)
Total CHOL/HDL Ratio: 4
Triglycerides: 196 mg/dL — ABNORMAL HIGH (ref 0.0–149.0)
VLDL: 39.2 mg/dL (ref 0.0–40.0)

## 2012-01-01 LAB — LDL CHOLESTEROL, DIRECT: Direct LDL: 129.3 mg/dL

## 2012-01-01 LAB — HEPATIC FUNCTION PANEL
AST: 27 U/L (ref 0–37)
Albumin: 3.8 g/dL (ref 3.5–5.2)
Total Bilirubin: 0.4 mg/dL (ref 0.3–1.2)

## 2012-02-06 LAB — HM PAP SMEAR: HM Pap smear: NEGATIVE

## 2012-03-05 ENCOUNTER — Encounter: Payer: Self-pay | Attending: "Endocrinology | Admitting: *Deleted

## 2012-03-05 DIAGNOSIS — Z713 Dietary counseling and surveillance: Secondary | ICD-10-CM

## 2012-03-11 NOTE — Progress Notes (Signed)
Patient attended the [facility]: Hypertension/High Cholesterol nutrition class on 03/05/12.  Topics covered include:   1. Complications of Hyperlipidemia and/or Hypertension. 2. Ways to reduce risk of heart disease.  3. Identifying fat and sodium content on food labels. 4. Ways to decrease sodium intake. 5. Optimal amount of daily saturated fat intake. 6. Optimal amount of daily sodium intake.  7. Foods to limit/avoid on a heart healthy diet.  Patient to follow-up with [facility] prn.  

## 2012-05-08 ENCOUNTER — Encounter: Payer: Self-pay | Admitting: Family Medicine

## 2012-05-08 ENCOUNTER — Ambulatory Visit (INDEPENDENT_AMBULATORY_CARE_PROVIDER_SITE_OTHER): Payer: Commercial Managed Care - PPO | Admitting: Family Medicine

## 2012-05-08 VITALS — BP 110/70 | HR 80 | Temp 98.2°F | Resp 12 | Ht 64.0 in | Wt 129.0 lb

## 2012-05-08 DIAGNOSIS — R002 Palpitations: Secondary | ICD-10-CM

## 2012-05-08 DIAGNOSIS — Z87448 Personal history of other diseases of urinary system: Secondary | ICD-10-CM

## 2012-05-08 DIAGNOSIS — E785 Hyperlipidemia, unspecified: Secondary | ICD-10-CM

## 2012-05-08 NOTE — Progress Notes (Signed)
  Subjective:    Patient ID: Barbara Carlson, female    DOB: 19-Mar-1961, 51 y.o.   MRN: [id number]  HPI  Patient seen to establish care. She has history of palpitations and tachycardia unspecified with extensive workup previously. Remains on low-dose atenolol 25 mg daily.  Has seen cardiologist regularly for the past several years. She relates hematuria workup during the past year which was negative. No recent gross hematuria. History of childhood asthma. Also history of hyperlipidemia within the past year. She made some lifestyle changes and saw significant reduction lipids. Was prescribed Lipitor but never went on this. She takes red yeast rice extract. No cardiac history. No recent chest pains.  She has history of basal cell carcinoma right trunk. Followed by dermatologist regularly. She sees gynecologist yearly. Family history reviewed as below  Social history she is married. Works in [employer]. Nonsmoker. No alcohol use.  Past Medical History  Diagnosis Date  . Other dyspnea and respiratory abnormality   . Other and unspecified hyperlipidemia   . Palpitations   . Tachycardia, unspecified    History reviewed. No pertinent past surgical history.  reports that she has never smoked. She has never used smokeless tobacco. She reports that she does not drink alcohol or use illicit drugs. family history includes Cancer (age of onset:70) in her father; Heart disease in her mother; Hyperlipidemia in her father; and Hypertension in her father. No Known Allergies    Review of Systems  Constitutional: Negative for appetite change, fatigue and unexpected weight change.  Eyes: Negative for visual disturbance.  Respiratory: Negative for cough, chest tightness, shortness of breath and wheezing.   Cardiovascular: Negative for chest pain, palpitations and leg swelling.  Neurological: Negative for dizziness, seizures, syncope, weakness, light-headedness and headaches.       Objective:     Physical Exam  Constitutional: She appears well-developed and well-nourished.  Neck: Neck supple. No thyromegaly present.  Cardiovascular: Normal rate and regular rhythm.   Pulmonary/Chest: Effort normal and breath sounds normal. No respiratory distress. She has no wheezes. She has no rales.  Musculoskeletal: She exhibits no edema.  Lymphadenopathy:    She has no cervical adenopathy.          Assessment & Plan:  #1 history of palpitations and tachycardia currently stable on low-dose atenolol #2 history of hematuria with recent urologic evaluation negative  #3 dyslipidemia. Recheck lipid panel and she'll return next week fasting for that

## 2012-05-12 ENCOUNTER — Encounter: Payer: Self-pay | Admitting: Family Medicine

## 2012-05-14 ENCOUNTER — Other Ambulatory Visit (INDEPENDENT_AMBULATORY_CARE_PROVIDER_SITE_OTHER): Payer: Commercial Managed Care - PPO

## 2012-05-14 ENCOUNTER — Encounter: Payer: Self-pay | Admitting: Family Medicine

## 2012-05-14 DIAGNOSIS — E785 Hyperlipidemia, unspecified: Secondary | ICD-10-CM

## 2012-05-14 LAB — LIPID PANEL: VLDL: 40 mg/dL (ref 0.0–40.0)

## 2012-07-22 ENCOUNTER — Other Ambulatory Visit: Payer: Self-pay | Admitting: Gynecology

## 2012-07-22 DIAGNOSIS — Z1231 Encounter for screening mammogram for malignant neoplasm of breast: Secondary | ICD-10-CM

## 2012-08-29 ENCOUNTER — Ambulatory Visit
Admission: RE | Admit: 2012-08-29 | Discharge: 2012-08-29 | Disposition: A | Discharge status: Home / Self Care | Payer: 59 | Source: Ambulatory Visit | Attending: Gynecology | Admitting: Gynecology

## 2012-08-29 DIAGNOSIS — Z1231 Encounter for screening mammogram for malignant neoplasm of breast: Secondary | ICD-10-CM

## 2012-09-27 ENCOUNTER — Other Ambulatory Visit: Payer: Self-pay

## 2012-11-17 ENCOUNTER — Encounter: Payer: Self-pay | Admitting: Family Medicine

## 2012-11-18 MED ORDER — ATENOLOL 25 MG PO TABS: ORAL_TABLET | ORAL | Status: DC

## 2013-06-18 ENCOUNTER — Other Ambulatory Visit: Payer: Self-pay

## 2013-07-06 ENCOUNTER — Ambulatory Visit (INDEPENDENT_AMBULATORY_CARE_PROVIDER_SITE_OTHER): Payer: 59 | Admitting: Family Medicine

## 2013-07-06 ENCOUNTER — Encounter: Payer: Self-pay | Admitting: Family Medicine

## 2013-07-06 VITALS — BP 124/76 | HR 108 | Temp 97.9°F | Wt 132.0 lb

## 2013-07-06 DIAGNOSIS — M940 Chondrocostal junction syndrome [Tietze]: Secondary | ICD-10-CM

## 2013-07-06 NOTE — Patient Instructions (Signed)
Costochondritis Costochondritis (Tietze syndrome), or costochondral separation, is a swelling and irritation (inflammation) of the tissue (cartilage) that connects your ribs with your breastbone (sternum). It may occur on its own (spontaneously), through damage caused by an accident (trauma), or simply from coughing or minor exercise. It may take up to 6 weeks to get better and longer if you are unable to be conservative in your activities. HOME CARE INSTRUCTIONS   Avoid exhausting physical activity. Try not to strain your ribs during normal activity. This would include any activities using chest, belly (abdominal), and side muscles, especially if heavy weights are used.  Use ice for 15-20 minutes per hour while awake for the first 2 days. Place the ice in a plastic bag, and place a towel between the bag of ice and your skin.  Only take over-the-counter or prescription medicines for pain, discomfort, or fever as directed by your caregiver. SEEK IMMEDIATE MEDICAL CARE IF:   Your pain increases or you are very uncomfortable.  You have a fever.  You develop difficulty with your breathing.  You cough up blood.  You develop worse chest pains, shortness of breath, sweating, or vomiting.  You develop new, unexplained problems (symptoms). MAKE SURE YOU:   Understand these instructions.  Will watch your condition.  Will get help right away if you are not doing well or get worse. Document Released: 05/09/2005 Document Revised: 10/22/2011 Document Reviewed: 03/03/2013 Exit[employer] Patient Information 2014 [employer].  

## 2013-07-06 NOTE — Progress Notes (Signed)
  Subjective:    Patient ID: Barbara Carlson, female    DOB: 01-05-1961, 52 y.o.   MRN: [id number]  HPI Acute visit for left chest wall pain. Onset was last Friday night when she was rolling over in bed. She felt a sudden sharp pain left costochondral junction She's had some pain with movement and to touch since then has improved with ibuprofen, heat, or Aleve. No recent cough. No fevers or chills. No exertional chest pains. Pain is only reproduced with movement or touch. Denies any dyspnea with activity or any exercise intolerance.  Past Medical History  Diagnosis Date  . Other dyspnea and respiratory abnormality   . Other and unspecified hyperlipidemia   . Palpitations   . Tachycardia, unspecified    No past surgical history on file.  reports that she has never smoked. She has never used smokeless tobacco. She reports that she does not drink alcohol or use illicit drugs. family history includes Cancer (age of onset: 51) in her father; Heart disease in her mother; Hyperlipidemia in her father; Hypertension in her father. No Known Allergies    Review of Systems  Constitutional: Negative for fever and chills.  Respiratory: Negative for cough and shortness of breath.   Cardiovascular: Negative for palpitations and leg swelling.       Objective:   Physical Exam  Constitutional: She appears well-developed and well-nourished.  Cardiovascular: Normal rate, regular rhythm and normal heart sounds.  Exam reveals no gallop and no friction rub.   No murmur heard. Pulmonary/Chest: Effort normal and breath sounds normal. No respiratory distress. She has no wheezes. She has no rales. She exhibits tenderness.  Patient has some left chest wall tenderness at the costochondral junction lower sternal region.          Assessment & Plan:  Costochondritis. Continue anti-inflammatories as needed. She will continue with Aleve or ibuprofen. Followup promptly for any fever or new symptoms

## 2013-07-06 NOTE — Progress Notes (Signed)
Pre visit review using our clinic review tool, if applicable. No additional management support is needed unless otherwise documented below in the visit note. 

## 2013-08-13 HISTORY — PX: GANGLION CYST EXCISION: SHX1691

## 2013-10-15 ENCOUNTER — Other Ambulatory Visit: Payer: Self-pay

## 2013-10-15 DIAGNOSIS — Z1231 Encounter for screening mammogram for malignant neoplasm of breast: Secondary | ICD-10-CM

## 2013-10-30 ENCOUNTER — Ambulatory Visit
Admission: RE | Admit: 2013-10-30 | Discharge: 2013-10-30 | Disposition: A | Discharge status: Home / Self Care | Payer: 59 | Source: Ambulatory Visit

## 2013-10-30 DIAGNOSIS — Z1231 Encounter for screening mammogram for malignant neoplasm of breast: Secondary | ICD-10-CM

## 2013-11-26 ENCOUNTER — Other Ambulatory Visit: Payer: Self-pay | Admitting: Family Medicine

## 2013-11-27 ENCOUNTER — Other Ambulatory Visit: Payer: Self-pay | Admitting: Family Medicine

## 2013-11-27 MED ORDER — ATENOLOL 25 MG PO TABS: ORAL_TABLET | ORAL | Status: DC

## 2014-12-01 ENCOUNTER — Other Ambulatory Visit: Payer: Self-pay | Admitting: Family Medicine

## 2014-12-01 ENCOUNTER — Encounter: Payer: Self-pay | Admitting: Family Medicine

## 2014-12-01 ENCOUNTER — Other Ambulatory Visit: Payer: Self-pay

## 2014-12-01 DIAGNOSIS — Z1231 Encounter for screening mammogram for malignant neoplasm of breast: Secondary | ICD-10-CM

## 2014-12-02 ENCOUNTER — Ambulatory Visit
Admission: RE | Admit: 2014-12-02 | Discharge: 2014-12-02 | Disposition: A | Discharge status: Home / Self Care | Payer: 59 | Source: Ambulatory Visit

## 2014-12-02 ENCOUNTER — Other Ambulatory Visit (INDEPENDENT_AMBULATORY_CARE_PROVIDER_SITE_OTHER): Payer: 59

## 2014-12-02 DIAGNOSIS — Z1231 Encounter for screening mammogram for malignant neoplasm of breast: Secondary | ICD-10-CM

## 2014-12-02 DIAGNOSIS — Z0000 Encounter for general adult medical examination without abnormal findings: Secondary | ICD-10-CM | POA: Diagnosis not present

## 2014-12-02 DIAGNOSIS — R7989 Other specified abnormal findings of blood chemistry: Secondary | ICD-10-CM

## 2014-12-02 LAB — BASIC METABOLIC PANEL
BUN: 17 mg/dL (ref 6–23)
CALCIUM: 9.8 mg/dL (ref 8.4–10.5)
CHLORIDE: 104 meq/L (ref 96–112)
CO2: 28 mEq/L (ref 19–32)
Creatinine, Ser: 0.83 mg/dL (ref 0.40–1.20)
GFR: 76.41 mL/min (ref >60.00)
Glucose, Bld: 85 mg/dL (ref 70–99)
Potassium: 4.6 mEq/L (ref 3.5–5.1)
SODIUM: 137 meq/L (ref 135–145)

## 2014-12-02 LAB — LIPID PANEL
CHOL/HDL RATIO: 6
Cholesterol: 249 mg/dL — ABNORMAL HIGH (ref 0–200)
HDL: 43.6 mg/dL (ref >39.00)
NONHDL: 205.4
Triglycerides: 282 mg/dL — ABNORMAL HIGH (ref 0.0–149.0)
VLDL: 56.4 mg/dL — ABNORMAL HIGH (ref 0.0–40.0)

## 2014-12-02 LAB — CBC WITH DIFFERENTIAL/PLATELET
BASOS ABS: 0 10*3/uL (ref 0.0–0.1)
Basophils Relative: 0.7 % (ref 0.0–3.0)
EOS ABS: 0.5 10*3/uL (ref 0.0–0.7)
Eosinophils Relative: 7.4 % — ABNORMAL HIGH (ref 0.0–5.0)
HCT: 38.4 % (ref 36.0–46.0)
Hemoglobin: 12.9 g/dL (ref 12.0–15.0)
LYMPHS ABS: 2.6 10*3/uL (ref 0.7–4.0)
Lymphocytes Relative: 37.7 % (ref 12.0–46.0)
MCHC: 33.7 g/dL (ref 30.0–36.0)
MCV: 90.1 fl (ref 78.0–100.0)
MONOS PCT: 8.4 % (ref 3.0–12.0)
Monocytes Absolute: 0.6 10*3/uL (ref 0.1–1.0)
Neutro Abs: 3.2 10*3/uL (ref 1.4–7.7)
Neutrophils Relative %: 45.8 % (ref 43.0–77.0)
PLATELETS: 324 10*3/uL (ref 150.0–400.0)
RBC: 4.26 Mil/uL (ref 3.87–5.11)
RDW: 13.7 % (ref 11.5–15.5)
WBC: 6.9 10*3/uL (ref 4.0–10.5)

## 2014-12-02 LAB — HEPATIC FUNCTION PANEL
ALK PHOS: 71 U/L (ref 39–117)
ALT: 16 U/L (ref 0–35)
AST: 23 U/L (ref 0–37)
Albumin: 4.2 g/dL (ref 3.5–5.2)
BILIRUBIN DIRECT: 0.1 mg/dL (ref 0.0–0.3)
Total Bilirubin: 0.7 mg/dL (ref 0.2–1.2)
Total Protein: 7.6 g/dL (ref 6.0–8.3)

## 2014-12-02 LAB — LDL CHOLESTEROL, DIRECT: Direct LDL: 155 mg/dL

## 2014-12-02 LAB — TSH: TSH: 2.54 u[IU]/mL (ref 0.35–4.50)

## 2014-12-06 ENCOUNTER — Ambulatory Visit (INDEPENDENT_AMBULATORY_CARE_PROVIDER_SITE_OTHER): Payer: 59 | Admitting: Family Medicine

## 2014-12-06 ENCOUNTER — Encounter: Payer: Self-pay | Admitting: Family Medicine

## 2014-12-06 VITALS — BP 120/80 | HR 97 | Temp 97.8°F | Ht 64.0 in | Wt 137.0 lb

## 2014-12-06 DIAGNOSIS — Z0000 Encounter for general adult medical examination without abnormal findings: Secondary | ICD-10-CM | POA: Diagnosis not present

## 2014-12-06 MED ORDER — FLUTICASONE PROPIONATE 50 MCG/ACT NA SUSP: 2.0000 | Freq: Every day | NASAL | Status: DC

## 2014-12-06 MED ORDER — ATENOLOL 25 MG PO TABS: 12.5000 mg | ORAL_TABLET | Freq: Every day | ORAL | Status: DC

## 2014-12-06 NOTE — Progress Notes (Signed)
Pre visit review using our clinic review tool, if applicable. No additional management support is needed unless otherwise documented below in the visit note. 

## 2014-12-06 NOTE — Progress Notes (Signed)
   Subjective:    Patient ID: Barbara Carlson, female    DOB: 11/07/1960, 54 y.o.   MRN: [id number]  HPI Patient seen for complete physical. She sees gynecologist regularly. She has history of palpitations which are controlled on low-dose atenolol. She has never had screening colonoscopy. Mammogram up-to-date. She has history of dyslipidemia and has been reluctant to use statins. Poor compliance with diet and very little exercise recently.  Past Medical History  Diagnosis Date  . Other dyspnea and respiratory abnormality   . Other and unspecified hyperlipidemia   . Palpitations   . Tachycardia, unspecified    No past surgical history on file.  reports that she has never smoked. She has never used smokeless tobacco. She reports that she does not drink alcohol or use illicit drugs. family history includes Cancer (age of onset: 36) in her father; Heart disease in her mother; Hyperlipidemia in her father; Hypertension in her father. No Known Allergies    Review of Systems  Constitutional: Negative for fever, activity change, appetite change, fatigue and unexpected weight change.  HENT: Negative for ear pain, hearing loss, sore throat and trouble swallowing.   Eyes: Negative for visual disturbance.  Respiratory: Negative for cough and shortness of breath.   Cardiovascular: Negative for chest pain and palpitations.  Gastrointestinal: Negative for abdominal pain, diarrhea, constipation and blood in stool.  Genitourinary: Negative for dysuria and hematuria.  Musculoskeletal: Negative for myalgias, back pain and arthralgias.  Skin: Negative for rash.  Neurological: Negative for dizziness, syncope and headaches.  Hematological: Negative for adenopathy.  Psychiatric/Behavioral: Negative for confusion and dysphoric mood.       Objective:   Physical Exam  Constitutional: She is oriented to person, place, and time. She appears well-developed and well-nourished.  HENT:  Head: Normocephalic  and atraumatic.  Eyes: EOM are normal. Pupils are equal, round, and reactive to light.  Neck: Normal range of motion. Neck supple. No thyromegaly present.  Cardiovascular: Normal rate, regular rhythm and normal heart sounds.   No murmur heard. Pulmonary/Chest: Breath sounds normal. No respiratory distress. She has no wheezes. She has no rales.  Abdominal: Soft. Bowel sounds are normal. She exhibits no distension and no mass. There is no tenderness. There is no rebound and no guarding.  Genitourinary:  Per GYN  Musculoskeletal: Normal range of motion. She exhibits no edema.  Lymphadenopathy:    She has no cervical adenopathy.  Neurological: She is alert and oriented to person, place, and time. She displays normal reflexes. No cranial nerve deficit.  Skin: No rash noted.  Psychiatric: She has a normal mood and affect. Her behavior is normal. Judgment and thought content normal.          Assessment & Plan:  Complete physical. Set up screening colonoscopy. Labs reviewed. 10 year risk of CAD event 2%. She will make some dietary changes with reducing saturated and trans-fats and consider follow-up fasting lipid panel in 6 months

## 2014-12-06 NOTE — Patient Instructions (Signed)
We will set up colonoscopy  

## 2015-02-09 ENCOUNTER — Telehealth: Payer: Self-pay | Admitting: Physician Assistant

## 2015-02-09 ENCOUNTER — Ambulatory Visit (INDEPENDENT_AMBULATORY_CARE_PROVIDER_SITE_OTHER): Payer: 59 | Admitting: Internal Medicine

## 2015-02-09 ENCOUNTER — Encounter: Payer: Self-pay | Admitting: Internal Medicine

## 2015-02-09 VITALS — BP 116/74 | Temp 98.1°F | Wt 134.3 lb

## 2015-02-09 DIAGNOSIS — R002 Palpitations: Secondary | ICD-10-CM | POA: Diagnosis not present

## 2015-02-09 DIAGNOSIS — I4949 Other premature depolarization: Secondary | ICD-10-CM | POA: Diagnosis not present

## 2015-02-09 DIAGNOSIS — Z77120 Contact with and (suspected) exposure to mold (toxic): Secondary | ICD-10-CM | POA: Diagnosis not present

## 2015-02-09 MED ORDER — ATENOLOL 25 MG PO TABS: 25.0000 mg | ORAL_TABLET | Freq: Every day | ORAL | Status: DC

## 2015-02-09 NOTE — Progress Notes (Signed)
Pre visit review using our clinic review tool, if applicable. No additional management support is needed unless otherwise documented below in the visit note.  Chief Complaint  Patient presents with  . Palpitations    HPI: Patient Barbara Carlson  comes in today for SDA for problem evaluation. PCP NA  This week.  Saturday ( 3 days ago )  Exposed to ;lots of mold in moms old house .   Worried a bit  about this   No mask exposed with 30 minutes .[name]  Felt like a cold the next day .  Minor cough no sob  Then  felt skipped beats  At work  On an d off   ocassional  cough . Like a chest cold . Yesterday  No pvcs  Then restarted last night.  More tha 2 per minute.  Off and on .   No fever sob sweating  Called  Cardiology  because more than 3 years need referral talked with    Rosaria Ferries  PA in hosp team  Advised  To inc  Atenolol to 25 .  Per day sent in and to order holter or event monitor and fu card appt.    Past hx of tachycardia and palpitations   See below  ekg 2013 nl but ivcd right icomplete rbbb? Past Medical History:  2013 eval bt cardiology  Put on atenolol  1. Inappropriate sinus tachycardia: Holter monitor showed an average heart rate of 107. Her heart rate was in the 90s-100s while she was asleep and in the 110s while she was awake. PACs with no significant arrhythmia.  2. Echo (2/10): EF 60%, normal study.  3. Hyperlipidemia  ROS: See pertinent positives and negatives per HPI. No fever syncope racing OSA   Past Medical History  Diagnosis Date  . Other dyspnea and respiratory abnormality   . Other and unspecified hyperlipidemia   . Palpitations   . Tachycardia, unspecified     Family History  Problem Relation Age of Onset  . Heart disease Mother     Atrial fibrillation  . Cancer Father 53    lung  . Hyperlipidemia Father   . Hypertension Father     History   Social History  . Marital Status: Married    Spouse Name: N/A  . Number of Children: N/A  . Years of  Education: N/A   Occupational History  . Nurse [employer]   Social History Main Topics  . Smoking status: Never Smoker   . Smokeless tobacco: Never Used  . Alcohol Use: No  . Drug Use: No  . Sexual Activity: Not on file   Other Topics Concern  . None   Social History Narrative    Outpatient Prescriptions Prior to Visit  Medication Sig Dispense Refill  . atenolol (TENORMIN) 25 MG tablet Take 1 tablet (25 mg total) by mouth daily. 90 tablet 3  . cetirizine (ZYRTEC) 10 MG tablet Take 10 mg by mouth daily.    . fluticasone (FLONASE) 50 MCG/ACT nasal spray Place 2 sprays into both nostrils daily. 16 g 11  . pseudoephedrine (SUDAFED) 30 MG tablet Take 30 mg by mouth daily.    [name] atenolol (TENORMIN) 25 MG tablet Take 0.5 tablets (12.5 mg total) by mouth daily. 45 tablet 3   No facility-administered medications prior to visit.     EXAM:  BP 116/74 mmHg  Temp(Src) 98.1 F (36.7 C) (Oral)  Wt 134 lb 4.8 oz (60.918 kg)  Body mass index is 23.04 kg/(m^2).  GENERAL: vitals reviewed and listed above, alert, oriented, appears well hydrated and in no acute distress HEENT: atraumatic, conjunctiva  clear, no obvious abnormalities on inspection of external nose and ears OP : no lesion edema or exudate  NECK: no obvious masses on inspection palpation  LUNGS: clear to auscultation bilaterally, no wheezes, rales or rhonchi, good air movement CV: HRRR, no clubbing cyanosis or  peripheral edema nl cap refill  Hr 90. Abdomen:  Sof,t normal bowel sounds without hepatosplenomegaly, no guarding rebound or masses no CVA tenderness MS: moves all extremities without noticeable focal  abnormality PSYCH: pleasant and cooperative,  Lab Results  Component Value Date   WBC 6.9 12/02/2014   HGB 12.9 12/02/2014   HCT 38.4 12/02/2014   PLT 324.0 12/02/2014   GLUCOSE 85 12/02/2014   CHOL 249* 12/02/2014   TRIG 282.0* 12/02/2014   HDL 43.60 12/02/2014   LDLDIRECT 155.0 12/02/2014   ALT 16  12/02/2014   AST 23 12/02/2014   NA 137 12/02/2014   K 4.6 12/02/2014   CL 104 12/02/2014   CREATININE 0.83 12/02/2014   BUN 17 12/02/2014   CO2 28 12/02/2014   TSH 2.54 12/02/2014   EKG rate 91 sinus R R'   Prominent p waves  mo sig change  ASSESSMENT AND PLAN:  Discussed the following assessment and plan:  Palpitations - sound premature no alarm findings agree inc the b blocker  fu ref cardiology  - Plan: EKG 12-Lead, Ambulatory referral to Cardiology  Premature beats - by clinical hx  - Plan: Ambulatory referral to Cardiology  Exposure to mold - no alarm findings  nl chest exam  FU if  progressive sx or as needed.  Caution with sudafed , caffiene etoh etc .  -Patient advised to return or notify health care team  if symptoms worsen ,persist or new concerns arise.  Patient Instructions  Your exam is reassuring.   Lungs clear . Increase the atenolol as advised to 25 mg .  Dose  . Agree fu with  cardiology as referral  These sounds like premature beats .    No concerning .      Standley Brooking. Tymere Depuy M.D.

## 2015-02-09 NOTE — Telephone Encounter (Signed)
Agree with monitor, increase atenolol to 25 mg daily, I can see in office after monitor is completed.

## 2015-02-09 NOTE — Telephone Encounter (Signed)
Patient of Dr. Aundra Dubin, but has stable and therefore has not been seen since 2013.   Ms. Provencal called because she has been having palpitations. She has a history of tachycardia and has been stable on a low dose of the beta blocker for the last 3 years. Recently, she has been under some increased emotional stress and has noticed increased palpitations. She is not having chest pain, shortness of breath or presyncope. She is concerned and wants to know what to do.  She is currently taking atenolol 25 mg, one half tab daily.  Will increase atenolol to 25 mg daily. We'll order a 30 day event monitor and have her follow-up with Dr. Aundra Dubin or another provider in 5 weeks or so.  She is to confirm that she is in sinus rhythm with her primary care physician or by other means. Contact us if she is not in sinus rhythm or she has any further problems or concerns.

## 2015-02-09 NOTE — Patient Instructions (Signed)
Your exam is reassuring.   Lungs clear . Increase the atenolol as advised to 25 mg .  Dose  . Agree fu with  cardiology as referral  These sounds like premature beats .    No concerning .

## 2015-02-10 NOTE — Telephone Encounter (Signed)
Per Dr Vassie Moment will review monitor once it has been completed (scheduled to be done 02/17/15) , Ok to schedule an appt for pt with PA/NP on a day he is in the office after monitor complete. I have sent message to [facility] to call pt to arrange follow up appt.

## 2015-02-13 NOTE — Telephone Encounter (Signed)
Make sure this patient has follow up with Dr Aundra Dubin arranged.

## 2015-02-15 NOTE — Telephone Encounter (Signed)
Pt is informed. Pt states that she is going to appointment 02/16/15

## 2015-02-16 ENCOUNTER — Ambulatory Visit (INDEPENDENT_AMBULATORY_CARE_PROVIDER_SITE_OTHER): Payer: 59

## 2015-02-16 DIAGNOSIS — R002 Palpitations: Secondary | ICD-10-CM | POA: Diagnosis not present

## 2015-03-08 ENCOUNTER — Encounter: Payer: Self-pay | Admitting: Physician Assistant

## 2015-04-12 NOTE — Progress Notes (Signed)
Cardiology Office Note   Date:  04/13/2015   ID:  Barbara Carlson, DOB Sep 23, 1960, MRN [id number]  PCP:  Eulas Post, MD  Cardiologist:  Dr. Loralie Champagne   Electrophysiologist:  n/a  Chief Complaint  Patient presents with  . Palpitations     History of Present Illness: Barbara Carlson is a 54 y.o. female with a hx of inappropriate sinus tachycardia, hyperlipidemia. She has been maintained on beta blocker therapy. Last seen by Dr. Aundra Dubin 10/2011.  Patient called in recently with palpitations. Beta blocker was adjusted and an event monitor was arranged. This was reviewed by Dr. Aundra Dubin. Demonstrated rare PVCs but no worrisome arrhythmias.  She is here alone.  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  She recently went to her mother's house to obtain some items.  It had been closed up for along time.  It had a lot of dust and mold. She had some respiratory issues while there and started to have frequent symptomatic PVCs. She had symptoms for a couple of days. As note, her monitor was unremarkable. She has not had any further palpitations. She denies chest pain, dyspnea, syncope, orthopnea, PND, edema.     Studies/Reports Reviewed Today:  Event Monitor 7/16 Rare PVCs, no worrisome arrhythmias.  Echo 09/2008 EF 60%   Past Medical History  Diagnosis Date  . Other dyspnea and respiratory abnormality   . Other and unspecified hyperlipidemia   . Palpitations   . Tachycardia, unspecified   1. Inappropriate sinus tachycardia: Holter monitor showed an average heart rate of 107. Her heart rate was in the 90s-100s while she was asleep and in the 110s while she was awake. PACs with no significant arrhythmia.  2. Echo (2/10): EF 60%, normal study.  3. Hyperlipidemia    History reviewed. No pertinent past surgical history.   Current Outpatient Prescriptions  Medication Sig Dispense Refill  . atenolol (TENORMIN) 25 MG tablet Take  0.5 tablets (12.5 mg total) by mouth daily. 45 tablet 3  . cetirizine (ZYRTEC) 10 MG tablet Take 10 mg by mouth once as needed for allergies.     . fluticasone (FLONASE) 50 MCG/ACT nasal spray Place 2 sprays into both nostrils daily as needed for allergies or rhinitis.    . pseudoephedrine (SUDAFED) 30 MG tablet Take 30 mg by mouth once as needed for congestion.      No current facility-administered medications for this visit.    Allergies:   Review of patient's allergies indicates no known allergies.    Social History:  The patient  reports that she has never smoked. She has never used smokeless tobacco. She reports that she does not drink alcohol or use illicit drugs.   Family History:  The patient's family history includes Cancer (age of onset: 63) in her father; Heart disease in her mother; Hyperlipidemia in her father; Hypertension in her father.    ROS:   Please see the history of present illness.   Review of Systems  All other systems reviewed and are negative.     PHYSICAL EXAM: VS:  BP 110/62 mmHg  Pulse 96  Ht 5\' 4"  (1.626 m)  Wt 135 lb (61.236 kg)  BMI 23.16 kg/m2    Wt Readings from Last 3 Encounters:  04/13/15 135 lb (61.236 kg)  02/09/15 134 lb 4.8 oz (60.918 kg)  12/06/14 137 lb (62.143 kg)     GEN: Well nourished, well developed, in no  acute distress HEENT: normal Neck: no JVD, no carotid bruits, no masses Cardiac:  Normal S1/S2, RRR; no murmur ,  no rubs or gallops, no edema   Respiratory:  clear to auscultation bilaterally, no wheezing, rhonchi or rales. GI: soft, nontender, nondistended, + BS MS: no deformity or atrophy Skin: warm and dry  Neuro:  CNs II-XII intact, Strength and sensation are intact Psych: Normal affect   EKG:  EKG is ordered today.  It demonstrates:   NSR, HR 96 rightward axis, biatrial enlargement, no change compared to prior tracings   Recent Labs: 12/02/2014: ALT 16; BUN 17; Creatinine, Ser 0.83; Hemoglobin 12.9; Platelets  324.0; Potassium 4.6; Sodium 137; TSH 2.54    Lipid Panel    Component Value Date/Time   CHOL 249* 12/02/2014 0940   TRIG 282.0* 12/02/2014 0940   HDL 43.60 12/02/2014 0940   CHOLHDL 6 12/02/2014 0940   VLDL 56.4* 12/02/2014 0940   LDLDIRECT 155.0 12/02/2014 0940      ASSESSMENT AND PLAN:  Intermittent palpitations:  No significant recurrence. Other monitor demonstrated normal sinus rhythm with rare PVCs. She notes symptom onset in the setting of bronchospasm. She was originally told to increase the dose of her beta blocker. She cut this back to a half a dose on her own. She has been stable since. Continue current therapy. Follow-up as needed. Avoid stimulants as much as possible.  Hyperlipidemia:  Managed by primary care.    Medication Changes: Current medicines are reviewed at length with the patient today.  Concerns regarding medicines are as outlined above.  The following changes have been made:   Discontinued Medications   ATENOLOL (TENORMIN) 25 MG TABLET    Take 1 tablet (25 mg total) by mouth daily.   FLUTICASONE (FLONASE) 50 MCG/ACT NASAL SPRAY    Place 2 sprays into both nostrils daily.   Modified Medications   Modified Medication Previous Medication   ATENOLOL (TENORMIN) 25 MG TABLET atenolol (TENORMIN) 25 MG tablet      Take 0.5 tablets (12.5 mg total) by mouth daily.    Take 12.5 mg by mouth daily.   New Prescriptions   No medications on file    Labs/ tests ordered today include:   Orders Placed This Encounter  Procedures  . EKG 12-Lead      Disposition:    FU with Dr. Aundra Dubin when necessary   Signed, Versie Starks, MHS 04/13/2015 2:34 PM    [address], [place], [place]  [address] Phone: [phone]; Fax: [phone]

## 2015-04-13 ENCOUNTER — Ambulatory Visit (INDEPENDENT_AMBULATORY_CARE_PROVIDER_SITE_OTHER): Payer: 59 | Admitting: Physician Assistant

## 2015-04-13 ENCOUNTER — Encounter: Payer: Self-pay | Admitting: Physician Assistant

## 2015-04-13 VITALS — BP 110/62 | HR 96 | Ht 64.0 in | Wt 135.0 lb

## 2015-04-13 DIAGNOSIS — E785 Hyperlipidemia, unspecified: Secondary | ICD-10-CM

## 2015-04-13 DIAGNOSIS — R002 Palpitations: Secondary | ICD-10-CM

## 2015-04-13 MED ORDER — ATENOLOL 25 MG PO TABS: 12.5000 mg | ORAL_TABLET | Freq: Every day | ORAL | Status: DC

## 2015-04-13 NOTE — Patient Instructions (Signed)
Medication Instructions:  Your physician recommends that you continue on your current medications as directed. Please refer to the Current Medication list given to you today.  Labwork: NONE  Testing/Procedures: NONE  Follow-Up: Your physician recommends that you schedule a follow-up appointment with Dr. Aundra Dubin as needed.   Any Other Special Instructions Will Be Listed Below (If Applicable).

## 2015-08-24 MED FILL — ATENOLOL 25 MG TABLET: 25 | 90 days supply | Qty: 45 | Fill #2

## 2015-11-24 MED FILL — ATENOLOL 25 MG TABLET: 25 | 90 days supply | Qty: 45 | Fill #3

## 2016-02-25 ENCOUNTER — Emergency Department (HOSPITAL_BASED_OUTPATIENT_CLINIC_OR_DEPARTMENT_OTHER)
Admission: EM | Admit: 2016-02-25 | Discharge: 2016-02-25 | Disposition: A | Discharge status: Home / Self Care | Payer: 59 | Attending: Emergency Medicine | Admitting: Emergency Medicine

## 2016-02-25 ENCOUNTER — Emergency Department (HOSPITAL_BASED_OUTPATIENT_CLINIC_OR_DEPARTMENT_OTHER): Payer: 59

## 2016-02-25 ENCOUNTER — Encounter (HOSPITAL_BASED_OUTPATIENT_CLINIC_OR_DEPARTMENT_OTHER): Payer: Self-pay | Admitting: Emergency Medicine

## 2016-02-25 DIAGNOSIS — R531 Weakness: Secondary | ICD-10-CM | POA: Diagnosis not present

## 2016-02-25 DIAGNOSIS — E785 Hyperlipidemia, unspecified: Secondary | ICD-10-CM | POA: Insufficient documentation

## 2016-02-25 DIAGNOSIS — R0602 Shortness of breath: Secondary | ICD-10-CM | POA: Diagnosis not present

## 2016-02-25 DIAGNOSIS — R079 Chest pain, unspecified: Secondary | ICD-10-CM

## 2016-02-25 DIAGNOSIS — R072 Precordial pain: Secondary | ICD-10-CM | POA: Diagnosis not present

## 2016-02-25 LAB — BASIC METABOLIC PANEL
ANION GAP: 6 (ref 5–15)
BUN: 21 mg/dL — ABNORMAL HIGH (ref 6–20)
CALCIUM: 9.4 mg/dL (ref 8.9–10.3)
CHLORIDE: 104 mmol/L (ref 101–111)
CO2: 27 mmol/L (ref 22–32)
Creatinine, Ser: 0.88 mg/dL (ref 0.44–1.00)
GFR calc non Af Amer: >60 mL/min (ref >60)
GLUCOSE: 97 mg/dL (ref 65–99)
POTASSIUM: 3.9 mmol/L (ref 3.5–5.1)
Sodium: 137 mmol/L (ref 135–145)

## 2016-02-25 LAB — URINALYSIS, ROUTINE W REFLEX MICROSCOPIC
BILIRUBIN URINE: NEGATIVE
Glucose, UA: NEGATIVE mg/dL
Ketones, ur: NEGATIVE mg/dL
LEUKOCYTES UA: NEGATIVE
NITRITE: NEGATIVE
PH: 6 (ref 5.0–8.0)
Protein, ur: NEGATIVE mg/dL
SPECIFIC GRAVITY, URINE: 1.011 (ref 1.005–1.030)

## 2016-02-25 LAB — URINE MICROSCOPIC-ADD ON

## 2016-02-25 LAB — CBC WITH DIFFERENTIAL/PLATELET
BASOS ABS: 0 10*3/uL (ref 0.0–0.1)
BASOS PCT: 0 %
Eosinophils Absolute: 0.3 10*3/uL (ref 0.0–0.7)
Eosinophils Relative: 5 %
HEMATOCRIT: 37.1 % (ref 36.0–46.0)
HEMOGLOBIN: 12.8 g/dL (ref 12.0–15.0)
LYMPHS PCT: 30 %
Lymphs Abs: 1.6 10*3/uL (ref 0.7–4.0)
MCH: 30.8 pg (ref 26.0–34.0)
MCHC: 34.5 g/dL (ref 30.0–36.0)
MCV: 89.2 fL (ref 78.0–100.0)
MONO ABS: 0.4 10*3/uL (ref 0.1–1.0)
Monocytes Relative: 8 %
NEUTROS ABS: 3.1 10*3/uL (ref 1.7–7.7)
NEUTROS PCT: 57 %
Platelets: 285 10*3/uL (ref 150–400)
RBC: 4.16 MIL/uL (ref 3.87–5.11)
RDW: 12.1 % (ref 11.5–15.5)
WBC: 5.4 10*3/uL (ref 4.0–10.5)

## 2016-02-25 LAB — D-DIMER, QUANTITATIVE: D-Dimer, Quant: <0.27 ug/mL-FEU (ref 0.00–0.50)

## 2016-02-25 LAB — TROPONIN I

## 2016-02-25 IMAGING — CR DG CHEST 2V
2 series · 2 of 2 positions shown · non-contrast
Comparison: [DATE]

CLINICAL DATA: Chest pain and shortness of breath

EXAM:
CHEST  2 VIEW

[w chest pa]
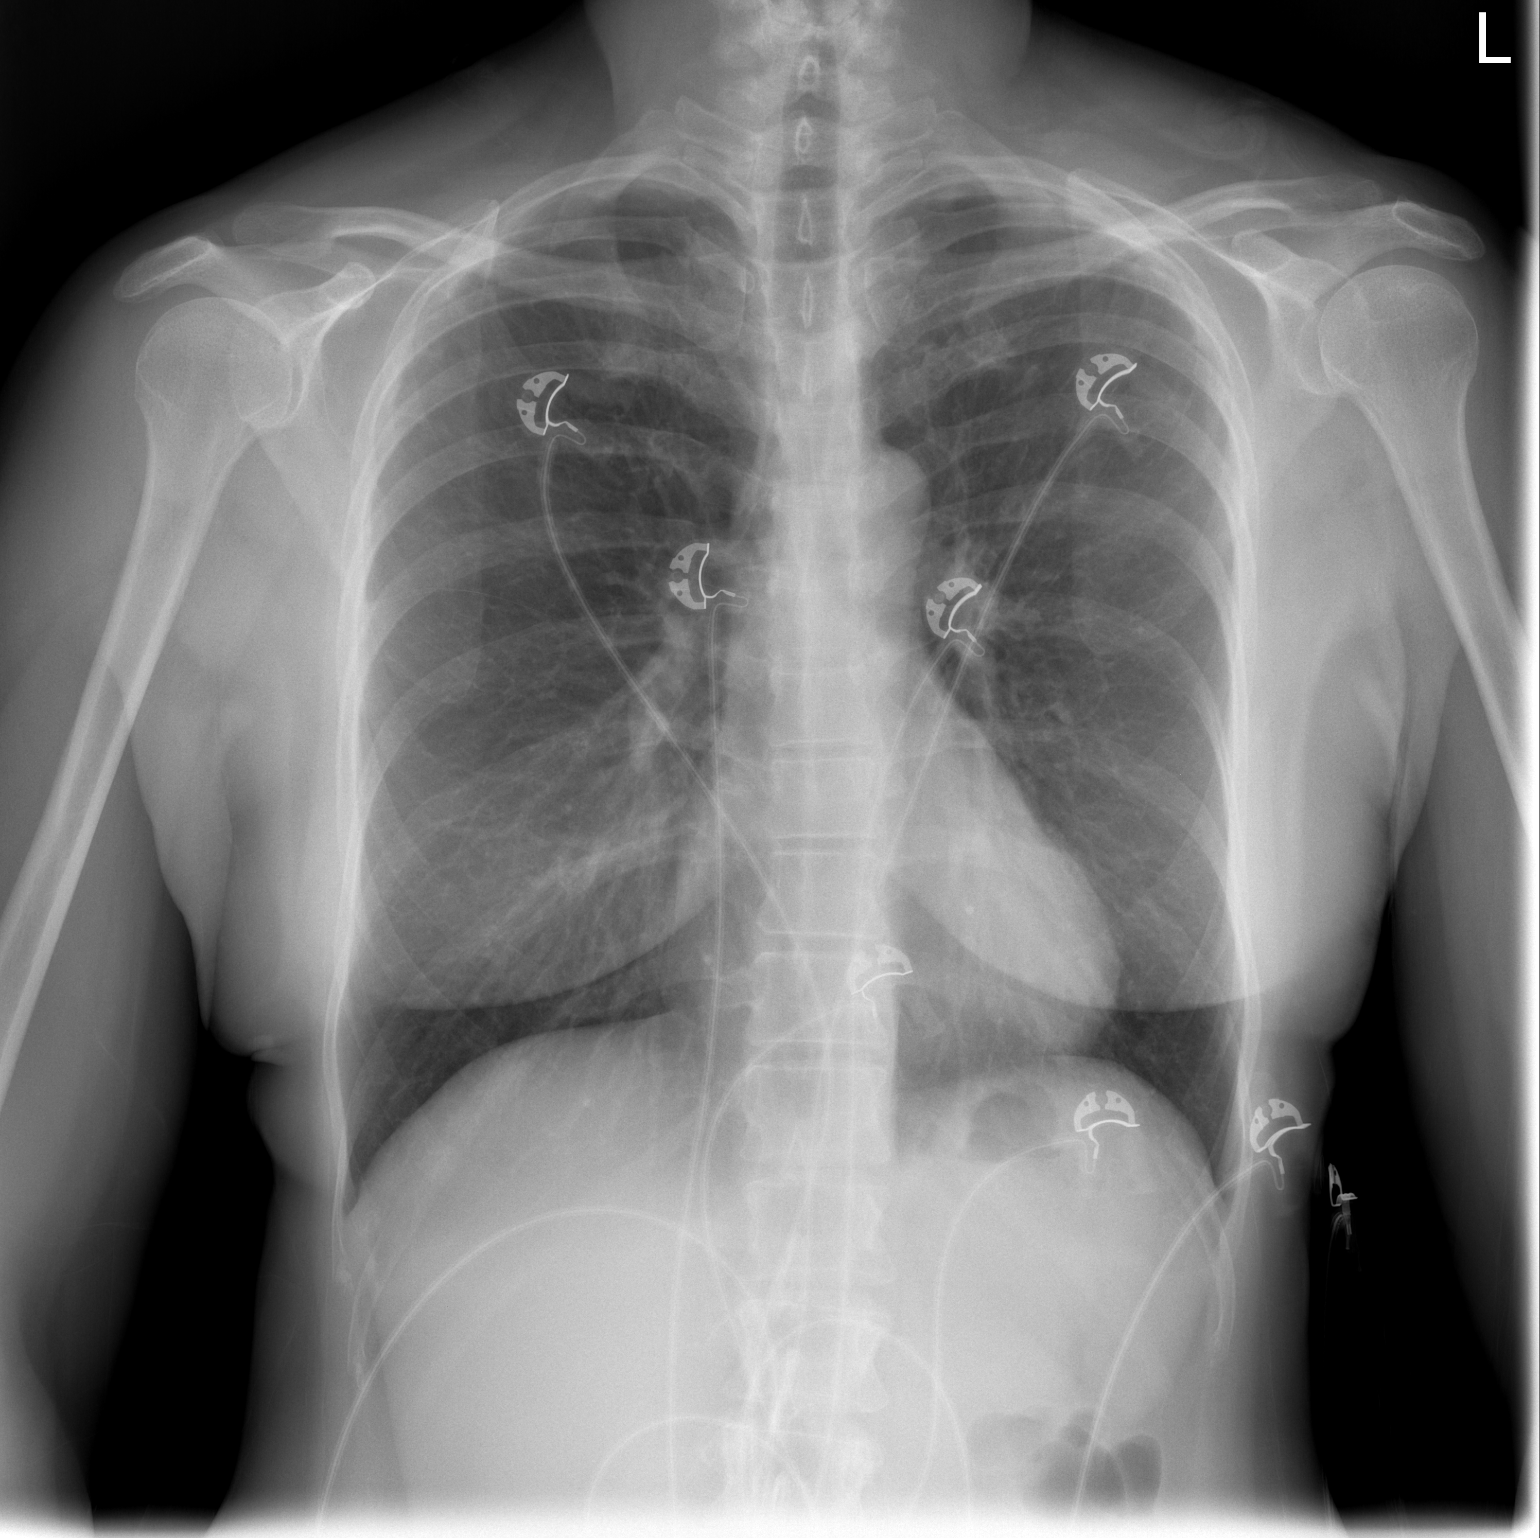

[w chest lat]
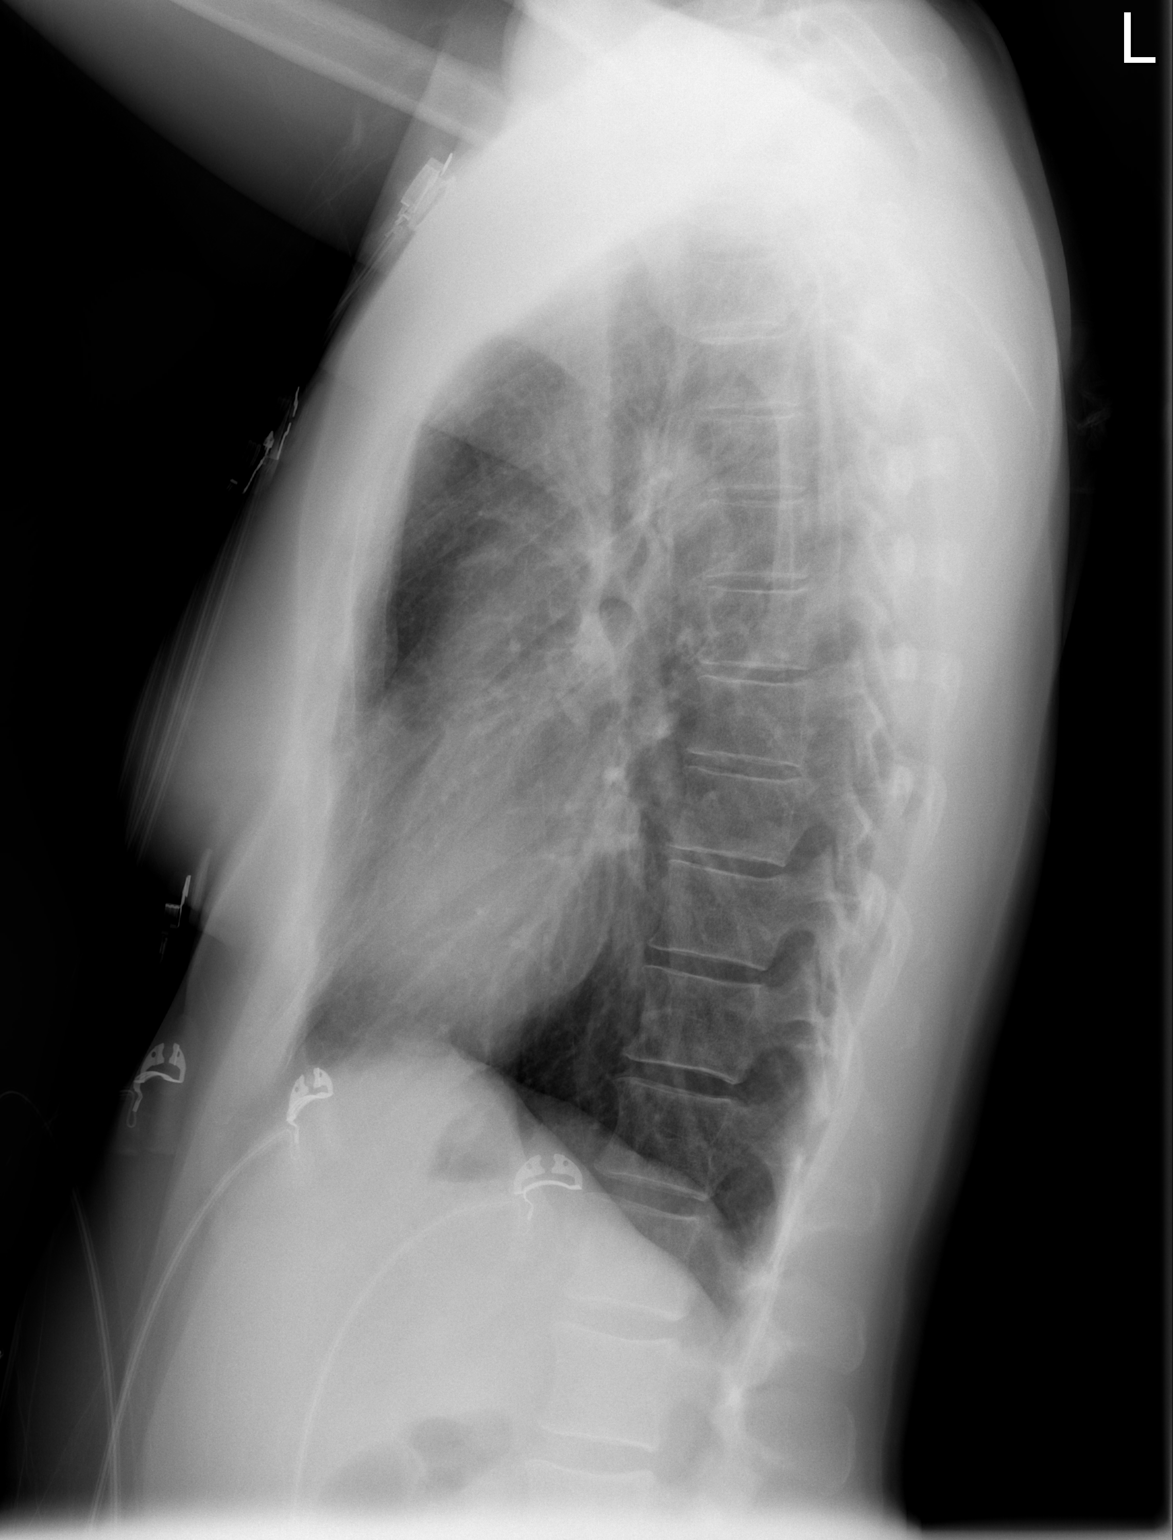

[2 of 2 positions shown; findings below may reference images not displayed]

FINDINGS: The heart size and mediastinal contours are within normal limits.
Both lungs are clear. The visualized skeletal structures are
unremarkable.
IMPRESSION: No active cardiopulmonary disease.

## 2016-02-25 MED ORDER — SODIUM CHLORIDE 0.9 % IV BOLUS (SEPSIS)
1000.0000 mL | Freq: Once | INTRAVENOUS | Status: AC
  Administered 2016-02-25: 1000 mL via INTRAVENOUS

## 2016-02-25 MED ORDER — ASPIRIN 81 MG PO CHEW
324.0000 mg | CHEWABLE_TABLET | Freq: Once | ORAL | Status: AC
  Administered 2016-02-25: 324 mg via ORAL
  Filled 2016-02-25: qty 4

## 2016-02-25 NOTE — Discharge Instructions (Signed)
Nonspecific Chest Pain  °Chest pain can be caused by many different conditions. There is always a chance that your pain could be related to something serious, such as a heart attack or a blood clot in your lungs. Chest pain can also be caused by conditions that are not life-threatening. If you have chest pain, it is very important to follow up with your health care provider. °CAUSES  °Chest pain can be caused by: °· Heartburn. °· Pneumonia or bronchitis. °· Anxiety or stress. °· Inflammation around your heart (pericarditis) or lung (pleuritis or pleurisy). °· A blood clot in your lung. °· A collapsed lung (pneumothorax). It can develop suddenly on its own (spontaneous pneumothorax) or from trauma to the chest. °· Shingles infection (varicella-zoster virus). °· Heart attack. °· Damage to the bones, muscles, and cartilage that make up your chest wall. This can include: °¨ Bruised bones due to injury. °¨ Strained muscles or cartilage due to frequent or repeated coughing or overwork. °¨ Fracture to one or more ribs. °¨ Sore cartilage due to inflammation (costochondritis). °RISK FACTORS  °Risk factors for chest pain may include: °· Activities that increase your risk for trauma or injury to your chest. °· Respiratory infections or conditions that cause frequent coughing. °· Medical conditions or overeating that can cause heartburn. °· Heart disease or family history of heart disease. °· Conditions or health behaviors that increase your risk of developing a blood clot. °· Having had chicken pox (varicella zoster). °SIGNS AND SYMPTOMS °Chest pain can feel like: °· Burning or tingling on the surface of your chest or deep in your chest. °· Crushing, pressure, aching, or squeezing pain. °· Dull or sharp pain that is worse when you move, cough, or take a deep breath. °· Pain that is also felt in your back, neck, shoulder, or arm, or pain that spreads to any of these areas. °Your chest pain may come and go, or it may stay  constant. °DIAGNOSIS °Lab tests or other studies may be needed to find the cause of your pain. Your health care provider may have you take a test called an ambulatory ECG (electrocardiogram). An ECG records your heartbeat patterns at the time the test is performed. You may also have other tests, such as: °· Transthoracic echocardiogram (TTE). During echocardiography, sound waves are used to create a picture of all of the heart structures and to look at how blood flows through your heart. °· Transesophageal echocardiogram (TEE). This is a more advanced imaging test that obtains images from inside your body. It allows your health care provider to see your heart in finer detail. °· Cardiac monitoring. This allows your health care provider to monitor your heart rate and rhythm in real time. °· Holter monitor. This is a portable device that records your heartbeat and can help to diagnose abnormal heartbeats. It allows your health care provider to track your heart activity for several days, if needed. °· Stress tests. These can be done through exercise or by taking medicine that makes your heart beat more quickly. °· Blood tests. °· Imaging tests. °TREATMENT  °Your treatment depends on what is causing your chest pain. Treatment may include: °· Medicines. These may include: °¨ Acid blockers for heartburn. °¨ Anti-inflammatory medicine. °¨ Pain medicine for inflammatory conditions. °¨ Antibiotic medicine, if an infection is present. °¨ Medicines to dissolve blood clots. °¨ Medicines to treat coronary artery disease. °· Supportive care for conditions that do not require medicines. This may include: °¨ Resting. °¨ Applying heat   or cold packs to injured areas. °¨ Limiting activities until pain decreases. °HOME CARE INSTRUCTIONS °· If you were prescribed an antibiotic medicine, finish it all even if you start to feel better. °· Avoid any activities that bring on chest pain. °· Do not use any tobacco products, including  cigarettes, chewing tobacco, or electronic cigarettes. If you need help quitting, ask your health care provider. °· Do not drink alcohol. °· Take medicines only as directed by your health care provider. °· Keep all follow-up visits as directed by your health care provider. This is important. This includes any further testing if your chest pain does not go away. °· If heartburn is the cause for your chest pain, you may be told to keep your head raised (elevated) while sleeping. This reduces the chance that acid will go from your stomach into your esophagus. °· Make lifestyle changes as directed by your health care provider. These may include: °¨ Getting regular exercise. Ask your health care provider to suggest some activities that are safe for you. °¨ Eating a heart-healthy diet. A registered dietitian can help you to learn healthy eating options. °¨ Maintaining a healthy weight. °¨ Managing diabetes, if necessary. °¨ Reducing stress. °SEEK MEDICAL CARE IF: °· Your chest pain does not go away after treatment. °· You have a rash with blisters on your chest. °· You have a fever. °SEEK IMMEDIATE MEDICAL CARE IF:  °· Your chest pain is worse. °· You have an increasing cough, or you cough up blood. °· You have severe abdominal pain. °· You have severe weakness. °· You faint. °· You have chills. °· You have sudden, unexplained chest discomfort. °· You have sudden, unexplained discomfort in your arms, back, neck, or jaw. °· You have shortness of breath at any time. °· You suddenly start to sweat, or your skin gets clammy. °· You feel nauseous or you vomit. °· You suddenly feel light-headed or dizzy. °· Your heart begins to beat quickly, or it feels like it is skipping beats. °These symptoms may represent a serious problem that is an emergency. Do not wait to see if the symptoms will go away. Get medical help right away. Call your local emergency services (911 in the [place] Do not drive yourself to the hospital. °  °This  information is not intended to replace advice given to you by your health care provider. Make sure you discuss any questions you have with your health care provider. °  °Document Released: 05/09/2005 Document Revised: 08/20/2014 Document Reviewed: 03/05/2014 °Elsevier Interactive Patient Education ©2016 [employer] ° °

## 2016-02-25 NOTE — ED Notes (Addendum)
Pt reports L sided chest pain which started gradually around 10 this morning. Pt states today she has "not felt well" and has felt weak. Pt states she has also had some palpitations today. Pt states pain radiates into her back. Pt reports difficulty taking a deep breath but denies SOB. Denies N/V.

## 2016-02-25 NOTE — ED Notes (Signed)
Pt ambulated unassisted to bathroom.

## 2016-02-25 NOTE — ED Provider Notes (Signed)
CSN: [id number]     Arrival date & time 02/25/16  1714 History  By signing my name below, I, Emmanuella Mensah, attest that this documentation has been prepared under the direction and in the presence of Gloriann Loan, PA-C. Electronically Signed: Judithann Sauger, ED Scribe. 02/25/2016. 5:48 PM.    Chief Complaint  Patient presents with  . Chest Pain   The history is provided by the patient. No language interpreter was used.   HPI Comments: Barbara Carlson is a 55 y.o. female with a hx of costochondritis, palpitations, and tachycardia who presents to the Emergency Department complaining of gradual onset of ongoing non-radiating substernal chest pain onset 10 am today. She reports associated generalized fatigue and weakness. She adds that she has mild discomfort when taking a deep breath but no SOB. She states that she was helping her niece move out of her dorm this week. She reports taking Advil with no relief.  She denies a hx of PE/DVT, hormone use, or any recent surgeries. Pt states that she is currently on the Atenolol for inappropriate tachycardia. She denies a personal or family hx cardiac disease. She denies any SOB, dizziness, lightheadedness, n/v, dysuria, or hematuria. Pt has upcoming appointment with her PCP on 03/14/16.   Past Medical History  Diagnosis Date  . Other dyspnea and respiratory abnormality   . Other and unspecified hyperlipidemia   . Palpitations   . Tachycardia, unspecified    History reviewed. No pertinent past surgical history. Family History  Problem Relation Age of Onset  . Heart disease Mother     Atrial fibrillation  . Cancer Father 77    lung  . Hyperlipidemia Father   . Hypertension Father    Social History  Substance Use Topics  . Smoking status: Never Smoker   . Smokeless tobacco: Never Used  . Alcohol Use: No   OB History    No data available     Review of Systems  Constitutional: Positive for fatigue.  Respiratory: Negative for shortness  of breath.   Cardiovascular: Positive for chest pain.  Gastrointestinal: Negative for nausea and vomiting.  Genitourinary: Negative for dysuria and hematuria.  Neurological: Positive for weakness. Negative for dizziness and light-headedness.  All other systems reviewed and are negative.     Allergies  Review of patient's allergies indicates no known allergies.  Home Medications   Prior to Admission medications   Medication Sig Start Date End Date Taking? Authorizing Provider  atenolol (TENORMIN) 25 MG tablet Take 0.5 tablets (12.5 mg total) by mouth daily. 04/13/15   Liliane Shi, PA-C  cetirizine (ZYRTEC) 10 MG tablet Take 10 mg by mouth once as needed for allergies.     Historical Provider, MD  fluticasone (FLONASE) 50 MCG/ACT nasal spray Place 2 sprays into both nostrils daily as needed for allergies or rhinitis.    Historical Provider, MD  pseudoephedrine (SUDAFED) 30 MG tablet Take 30 mg by mouth once as needed for congestion.     Historical Provider, MD   BP 122/71 mmHg  Pulse 97  Temp(Src) 98.5 F (36.9 C) (Oral)  Resp 16  Ht 5\' 4"  (1.626 m)  Wt 58.968 kg  BMI 22.30 kg/m2  SpO2 100%  LMP 09/05/2010 Physical Exam  Constitutional: She is oriented to person, place, and time. She appears well-developed and well-nourished.  Non-toxic appearance. She does not have a sickly appearance. She does not appear ill.  HENT:  Head: Normocephalic and atraumatic.  Mouth/Throat: Oropharynx is clear and moist.  Eyes: Conjunctivae are normal.  Neck: Normal range of motion. Neck supple.  Cardiovascular: Normal rate and regular rhythm.   Pulmonary/Chest: Effort normal and breath sounds normal. No accessory muscle usage or stridor. No respiratory distress. She has no wheezes. She has no rhonchi. She has no rales. She exhibits no tenderness.  Abdominal: Soft. Bowel sounds are normal. She exhibits no distension. There is no tenderness. There is no rebound and no guarding.  Musculoskeletal:  Normal range of motion.  Lymphadenopathy:    She has no cervical adenopathy.  Neurological: She is alert and oriented to person, place, and time.  Speech clear without dysarthria.  Skin: Skin is warm and dry.  Psychiatric: She has a normal mood and affect. Her behavior is normal.    ED Course  Procedures (including critical care time) DIAGNOSTIC STUDIES: Oxygen Saturation is 100% on RA, normal by my interpretation.    COORDINATION OF CARE: 5:48 PM- Pt advised of plan for treatment and pt agrees. Pt informed of her EKG results. She will receive lab work and chest x-ray for further evaluation.    Labs Review Labs Reviewed  BASIC METABOLIC PANEL - Abnormal; Notable for the following:    BUN 21 (*)    All other components within normal limits  URINALYSIS, ROUTINE W REFLEX MICROSCOPIC (NOT AT [facility]) - Abnormal; Notable for the following:    Hgb urine dipstick MODERATE (*)    All other components within normal limits  URINE MICROSCOPIC-ADD ON - Abnormal; Notable for the following:    Squamous Epithelial / LPF 0-5 (*)    Bacteria, UA MANY (*)    All other components within normal limits  URINE CULTURE  TROPONIN I  CBC WITH DIFFERENTIAL/PLATELET  D-DIMER, QUANTITATIVE (NOT AT [facility])  TROPONIN I    Imaging Review Dg Chest 2 View  02/25/2016  CLINICAL DATA:  Chest pain and shortness of breath EXAM: CHEST  2 VIEW COMPARISON:  09/23/2010 FINDINGS: The heart size and mediastinal contours are within normal limits. Both lungs are clear. The visualized skeletal structures are unremarkable. IMPRESSION: No active cardiopulmonary disease. Electronically Signed   By: Kerby Moors M.D.   On: 02/25/2016 18:05     Gloriann Loan, PA-C has personally reviewed and evaluated these images and lab results as part of her medical decision-making.   EKG Interpretation   Date/Time:  Saturday February 25 2016 17:27:26 EDT Ventricular Rate:  94 PR Interval:    QRS Duration: 96 QT Interval:  347 QTC  Calculation: 434 R Axis:   92 Text Interpretation:  Sinus rhythm Biatrial enlargement Consider right  ventricular hypertrophy Confirmed by ZACKOWSKI  MD, SCOTT ([id number]) on  02/25/2016 5:53:25 PM      MDM   Final diagnoses:  Chest pain, unspecified chest pain type   Patient presents with non-radiating substernal chest pain at 10 AM today with associated generalized fatigue.  Denies SOB, but states she feels uncomfortable when taking a deep breath.  No PE risk factors.  No personal history of cardiac disease.  No dizziness, lightheadedness, or other symptoms.  On exam, patient appears well, non-toxic or ill. Heart RRR, lungs CTAB, abdomen soft and benign.  No chest wall tenderness, chest pain is not reproducible on exam.  HEART score 1.  Low risk PE using Well's criteria.  EKG shows SR, no acute changes.  Will obtain CXR and labs.  Give ASA and IVF.  Delta troponin negative. EKG shows SR, doubt ACS. D-dimer negative.  Labs without acute abnormalities. UA  appears contaminated with squamous and many bacteria.  She is asymptomatic.  Urine culture sent.  CXR normal.  Recommend treatment with ibuprofen for possible musculoskeletal etiology.  Follow up PCP.  Discussed return precautions.  Patient agrees and acknowledges the above plan for discharge.   Case has been discussed with Dr. Rogene Houston who agrees with the above plan for discharge.   I personally performed the services described in this documentation, which was scribed in my presence. The recorded information has been reviewed and is accurate.   Gloriann Loan, PA-C 02/25/16 2202  Fredia Sorrow, MD 02/27/16 [phone]

## 2016-02-25 NOTE — ED Notes (Signed)
Patient states that she has had some fatigue today and mid chest to left chest pressure and pain

## 2016-02-27 ENCOUNTER — Encounter: Payer: Self-pay | Admitting: Family Medicine

## 2016-02-27 LAB — URINE CULTURE: Culture: 7000 — AB

## 2016-02-28 ENCOUNTER — Other Ambulatory Visit: Payer: Self-pay | Admitting: General Practice

## 2016-02-28 DIAGNOSIS — R002 Palpitations: Secondary | ICD-10-CM

## 2016-02-28 MED ORDER — ATENOLOL 25 MG PO TABS: 12.5000 mg | ORAL_TABLET | Freq: Every day | ORAL | Status: DC

## 2016-02-28 MED FILL — ATENOLOL 25 MG TABLET: 25 | 90 days supply | Qty: 45 | Fill #0

## 2016-03-13 ENCOUNTER — Other Ambulatory Visit (INDEPENDENT_AMBULATORY_CARE_PROVIDER_SITE_OTHER): Payer: 59

## 2016-03-13 DIAGNOSIS — R7989 Other specified abnormal findings of blood chemistry: Secondary | ICD-10-CM

## 2016-03-13 DIAGNOSIS — Z0000 Encounter for general adult medical examination without abnormal findings: Secondary | ICD-10-CM

## 2016-03-13 LAB — CBC WITH DIFFERENTIAL/PLATELET
BASOS ABS: 0 10*3/uL (ref 0.0–0.1)
Basophils Relative: 0.8 % (ref 0.0–3.0)
EOS ABS: 0.4 10*3/uL (ref 0.0–0.7)
Eosinophils Relative: 7.5 % — ABNORMAL HIGH (ref 0.0–5.0)
HEMATOCRIT: 35.7 % — AB (ref 36.0–46.0)
HEMOGLOBIN: 12.1 g/dL (ref 12.0–15.0)
LYMPHS PCT: 39.9 % (ref 12.0–46.0)
Lymphs Abs: 2.1 10*3/uL (ref 0.7–4.0)
MCHC: 33.8 g/dL (ref 30.0–36.0)
MCV: 88.7 fl (ref 78.0–100.0)
Monocytes Absolute: 0.5 10*3/uL (ref 0.1–1.0)
Monocytes Relative: 8.8 % (ref 3.0–12.0)
Neutro Abs: 2.3 10*3/uL (ref 1.4–7.7)
Neutrophils Relative %: 43 % (ref 43.0–77.0)
PLATELETS: 283 10*3/uL (ref 150.0–400.0)
RBC: 4.03 Mil/uL (ref 3.87–5.11)
RDW: 12.8 % (ref 11.5–15.5)
WBC: 5.4 10*3/uL (ref 4.0–10.5)

## 2016-03-13 LAB — TSH: TSH: 3.2 u[IU]/mL (ref 0.35–4.50)

## 2016-03-13 LAB — LIPID PANEL
Cholesterol: 258 mg/dL — ABNORMAL HIGH (ref 0–200)
HDL: 49.3 mg/dL (ref >39.00)
NonHDL: 208.59
Total CHOL/HDL Ratio: 5
Triglycerides: 232 mg/dL — ABNORMAL HIGH (ref 0.0–149.0)
VLDL: 46.4 mg/dL — ABNORMAL HIGH (ref 0.0–40.0)

## 2016-03-13 LAB — BASIC METABOLIC PANEL
BUN: 18 mg/dL (ref 6–23)
CALCIUM: 9.7 mg/dL (ref 8.4–10.5)
CO2: 28 mEq/L (ref 19–32)
CREATININE: 0.89 mg/dL (ref 0.40–1.20)
Chloride: 104 mEq/L (ref 96–112)
GFR: 70.16 mL/min (ref >60.00)
Glucose, Bld: 87 mg/dL (ref 70–99)
Potassium: 4.2 mEq/L (ref 3.5–5.1)
Sodium: 139 mEq/L (ref 135–145)

## 2016-03-13 LAB — HEPATIC FUNCTION PANEL
ALK PHOS: 66 U/L (ref 39–117)
ALT: 15 U/L (ref 0–35)
AST: 19 U/L (ref 0–37)
Albumin: 4.1 g/dL (ref 3.5–5.2)
Bilirubin, Direct: 0.1 mg/dL (ref 0.0–0.3)
TOTAL PROTEIN: 7.1 g/dL (ref 6.0–8.3)
Total Bilirubin: 0.4 mg/dL (ref 0.2–1.2)

## 2016-03-13 LAB — LDL CHOLESTEROL, DIRECT: Direct LDL: 166 mg/dL

## 2016-03-14 ENCOUNTER — Other Ambulatory Visit: Payer: Self-pay | Admitting: Obstetrics and Gynecology

## 2016-03-14 ENCOUNTER — Ambulatory Visit (INDEPENDENT_AMBULATORY_CARE_PROVIDER_SITE_OTHER): Payer: 59 | Admitting: Family Medicine

## 2016-03-14 ENCOUNTER — Encounter: Payer: Self-pay | Admitting: Family Medicine

## 2016-03-14 VITALS — BP 90/60 | HR 105 | Temp 98.1°F | Ht 64.0 in | Wt 137.0 lb

## 2016-03-14 DIAGNOSIS — Z0000 Encounter for general adult medical examination without abnormal findings: Secondary | ICD-10-CM

## 2016-03-14 DIAGNOSIS — E785 Hyperlipidemia, unspecified: Secondary | ICD-10-CM | POA: Diagnosis not present

## 2016-03-14 DIAGNOSIS — Z1231 Encounter for screening mammogram for malignant neoplasm of breast: Secondary | ICD-10-CM

## 2016-03-14 NOTE — Progress Notes (Signed)
Pre visit review using our clinic review tool, if applicable. No additional management support is needed unless otherwise documented below in the visit note. 

## 2016-03-14 NOTE — Patient Instructions (Signed)
I will set up GI referral. Let's plan repeat lipids in about 6 months.

## 2016-03-14 NOTE — Progress Notes (Signed)
Subjective:     Patient ID: Barbara Carlson, female   DOB: 1961/01/06, 55 y.o.   MRN: [id number]  HPI Patient here for physical exam.   She sees gynecologist yearly and still getting yearly mammograms. She gets regular Pap smears through them. She never went for colonoscopy last year. She realizes that she needs to go this year.  Long history of intermittent palpitations. Recently went to ED on 7/15 with palpitations. Workup there unrevealing. EKG with no acute abnormalities. She remains on low-dose atenolol. No palpitations since then. Never had any chest pains. Previous cardiac evaluation negative.  Past Medical History:  Diagnosis Date  . Other and unspecified hyperlipidemia   . Other dyspnea and respiratory abnormality   . Palpitations   . Tachycardia, unspecified    Past Surgical History:  Procedure Laterality Date  . TONSILLECTOMY Bilateral     reports that she has never smoked. She has never used smokeless tobacco. She reports that she does not drink alcohol or use drugs. family history includes Cancer (age of onset: 21) in her father; Heart disease in her mother; Hyperlipidemia in her father; Hypertension in her daughter and father. No Known Allergies    Review of Systems  Constitutional: Negative for activity change, appetite change, fatigue, fever and unexpected weight change.  HENT: Negative for ear pain, hearing loss, sore throat and trouble swallowing.   Eyes: Negative for visual disturbance.  Respiratory: Negative for cough and shortness of breath.   Cardiovascular: Positive for palpitations. Negative for chest pain.  Gastrointestinal: Negative for abdominal pain, blood in stool, constipation and diarrhea.  Genitourinary: Negative for dysuria and hematuria.  Musculoskeletal: Negative for arthralgias, back pain and myalgias.  Skin: Negative for rash.  Neurological: Negative for dizziness, syncope and headaches.  Hematological: Negative for adenopathy.   Psychiatric/Behavioral: Negative for confusion and dysphoric mood.       Objective:   Physical Exam  Constitutional: She is oriented to person, place, and time. She appears well-developed and well-nourished.  HENT:  Head: Normocephalic and atraumatic.  Eyes: EOM are normal. Pupils are equal, round, and reactive to light.  Neck: Normal range of motion. Neck supple. No thyromegaly present.  Cardiovascular: Normal rate, regular rhythm and normal heart sounds.   No murmur heard. Pulmonary/Chest: Breath sounds normal. No respiratory distress. She has no wheezes. She has no rales.  Abdominal: Soft. Bowel sounds are normal. She exhibits no distension and no mass. There is no tenderness. There is no rebound and no guarding.  Genitourinary:  Genitourinary Comments: Per GYN  Musculoskeletal: Normal range of motion. She exhibits no edema.  Lymphadenopathy:    She has no cervical adenopathy.  Neurological: She is alert and oriented to person, place, and time. She displays normal reflexes. No cranial nerve deficit.  Skin: No rash noted.  Psychiatric: She has a normal mood and affect. Her behavior is normal. Judgment and thought content normal.       Assessment:     Physical exam. Patient continues to see GYN.   needs screening colonoscopy.  Hyperlipidemia with overall low risk for CAD.    Plan:     -Labs reviewed. She has long history of hyperlipidemia. We discussed dietary management. Consider repeat fasting lipids in 6 months (per her request)  -Schedule screening colonoscopy  -She will continue GYN follow-up   Eulas Post MD [facility]

## 2016-03-21 ENCOUNTER — Ambulatory Visit
Admission: RE | Admit: 2016-03-21 | Discharge: 2016-03-21 | Disposition: A | Discharge status: Home / Self Care | Payer: 59 | Source: Ambulatory Visit | Attending: Obstetrics and Gynecology | Admitting: Obstetrics and Gynecology

## 2016-03-21 ENCOUNTER — Ambulatory Visit: Payer: 59

## 2016-03-21 DIAGNOSIS — Z1231 Encounter for screening mammogram for malignant neoplasm of breast: Secondary | ICD-10-CM | POA: Diagnosis not present

## 2016-03-22 DIAGNOSIS — Z6823 Body mass index (BMI) 23.0-23.9, adult: Secondary | ICD-10-CM | POA: Diagnosis not present

## 2016-03-22 DIAGNOSIS — Z01419 Encounter for gynecological examination (general) (routine) without abnormal findings: Secondary | ICD-10-CM | POA: Diagnosis not present

## 2016-03-27 DIAGNOSIS — Z13820 Encounter for screening for osteoporosis: Secondary | ICD-10-CM | POA: Diagnosis not present

## 2016-03-28 DIAGNOSIS — D2272 Melanocytic nevi of left lower limb, including hip: Secondary | ICD-10-CM | POA: Diagnosis not present

## 2016-03-28 DIAGNOSIS — Z85828 Personal history of other malignant neoplasm of skin: Secondary | ICD-10-CM | POA: Diagnosis not present

## 2016-03-28 DIAGNOSIS — D225 Melanocytic nevi of trunk: Secondary | ICD-10-CM | POA: Diagnosis not present

## 2016-03-28 DIAGNOSIS — L738 Other specified follicular disorders: Secondary | ICD-10-CM | POA: Diagnosis not present

## 2016-03-28 DIAGNOSIS — Z86018 Personal history of other benign neoplasm: Secondary | ICD-10-CM | POA: Diagnosis not present

## 2016-03-28 DIAGNOSIS — L821 Other seborrheic keratosis: Secondary | ICD-10-CM | POA: Diagnosis not present

## 2016-03-28 DIAGNOSIS — D485 Neoplasm of uncertain behavior of skin: Secondary | ICD-10-CM | POA: Diagnosis not present

## 2016-04-09 DIAGNOSIS — D485 Neoplasm of uncertain behavior of skin: Secondary | ICD-10-CM | POA: Diagnosis not present

## 2016-04-09 DIAGNOSIS — L98499 Non-pressure chronic ulcer of skin of other sites with unspecified severity: Secondary | ICD-10-CM | POA: Diagnosis not present

## 2016-04-13 ENCOUNTER — Encounter: Payer: Self-pay | Admitting: Family Medicine

## 2016-05-29 ENCOUNTER — Other Ambulatory Visit: Payer: Self-pay

## 2016-05-29 ENCOUNTER — Encounter: Payer: Self-pay | Admitting: Family Medicine

## 2016-05-29 ENCOUNTER — Other Ambulatory Visit: Payer: Self-pay | Admitting: Family Medicine

## 2016-05-29 DIAGNOSIS — R002 Palpitations: Secondary | ICD-10-CM

## 2016-05-29 MED ORDER — ATENOLOL 25 MG PO TABS: 12.5000 mg | ORAL_TABLET | Freq: Every day | ORAL | 3 refills | Status: DC

## 2016-05-29 MED ORDER — ATENOLOL 25 MG PO TABS: 12.5000 mg | ORAL_TABLET | Freq: Every day | ORAL | 11 refills | Status: DC

## 2016-05-29 MED FILL — ATENOLOL 25 MG TABLET: 25 | 90 days supply | Qty: 45 | Fill #0

## 2016-08-21 MED FILL — ATENOLOL 25 MG TABLET: 25 | 30 days supply | Qty: 15 | Fill #1

## 2016-09-20 MED FILL — ATENOLOL 25 MG TABLET: 25 | 30 days supply | Qty: 15 | Fill #2

## 2016-10-22 MED FILL — ATENOLOL 25 MG TABLET: 25 | 90 days supply | Qty: 45 | Fill #3

## 2016-12-26 ENCOUNTER — Encounter: Payer: Self-pay | Admitting: Family Medicine

## 2016-12-26 ENCOUNTER — Other Ambulatory Visit: Payer: Self-pay | Admitting: Family Medicine

## 2016-12-26 ENCOUNTER — Other Ambulatory Visit: Payer: Self-pay

## 2016-12-26 ENCOUNTER — Ambulatory Visit (INDEPENDENT_AMBULATORY_CARE_PROVIDER_SITE_OTHER): Payer: 59 | Admitting: Family Medicine

## 2016-12-26 VITALS — BP 110/64 | HR 102 | Temp 98.6°F | Wt 139.4 lb

## 2016-12-26 DIAGNOSIS — M79621 Pain in right upper arm: Secondary | ICD-10-CM

## 2016-12-26 NOTE — Progress Notes (Signed)
Subjective:     Patient ID: Barbara Carlson, female   DOB: 1961/02/19, 56 y.o.   MRN: [id number]  HPI Patient seen with about 6-7 week history of some vague pains in her right axillary region. Denies any injury. No neck pain. She has occasional "tingling" sensation and occasional pain that radiates from her right axillary region down toward her forearm and hand. Sometimes worse with movement. She's not noted any lymph node enlargement. She had recent mammogram in August 2017 which was normal. She's not noted any edema of her hand or forearm. No weakness. No numbness. She thought she palpated some increased "fullness "right axillary region.  She is concerned because of duration of symptoms. She denies any neck pain whatsoever.  Past Medical History:  Diagnosis Date  . Other and unspecified hyperlipidemia   . Other dyspnea and respiratory abnormality   . Palpitations   . Tachycardia, unspecified    Past Surgical History:  Procedure Laterality Date  . TONSILLECTOMY Bilateral     reports that she has never smoked. She has never used smokeless tobacco. She reports that she does not drink alcohol or use drugs. family history includes Cancer (age of onset: 15) in her father; Heart disease in her mother; Hyperlipidemia in her father; Hypertension in her daughter and father. No Known Allergies   Review of Systems  Constitutional: Negative for appetite change, chills, fever and unexpected weight change.  Respiratory: Negative for cough and shortness of breath.   Cardiovascular: Negative for chest pain.  Skin: Negative for rash.  Hematological: Negative for adenopathy. Does not bruise/bleed easily.       Objective:   Physical Exam  Constitutional: She appears well-developed and well-nourished.  Cardiovascular: Normal rate and regular rhythm.   Pulmonary/Chest: Effort normal and breath sounds normal. No respiratory distress. She has no wheezes. She has no rales.  Right axillary region is  examined. No overlying erythema. No warmth. No lymphadenopathy. No enlarged glands noted.  Musculoskeletal: She exhibits no edema.       Assessment:     Several week history of progressive right axillary pain. No appreciable lymphadenopathy on exam. Patient concerned that this area feels more "full "than left side.  No hidradenitis.      Plan:     We'll check right axillary ultrasound   Eulas Post MD [facility]

## 2017-01-21 MED FILL — ATENOLOL 25 MG TABLET: 25 | 90 days supply | Qty: 45 | Fill #4

## 2017-04-02 DIAGNOSIS — D225 Melanocytic nevi of trunk: Secondary | ICD-10-CM | POA: Diagnosis not present

## 2017-04-02 DIAGNOSIS — Z86018 Personal history of other benign neoplasm: Secondary | ICD-10-CM | POA: Diagnosis not present

## 2017-04-02 DIAGNOSIS — Z85828 Personal history of other malignant neoplasm of skin: Secondary | ICD-10-CM | POA: Diagnosis not present

## 2017-04-02 DIAGNOSIS — L814 Other melanin hyperpigmentation: Secondary | ICD-10-CM | POA: Diagnosis not present

## 2017-04-02 DIAGNOSIS — L821 Other seborrheic keratosis: Secondary | ICD-10-CM | POA: Diagnosis not present

## 2017-04-02 DIAGNOSIS — D1801 Hemangioma of skin and subcutaneous tissue: Secondary | ICD-10-CM | POA: Diagnosis not present

## 2017-04-19 MED FILL — ATENOLOL 25 MG TABLET: 25 | 90 days supply | Qty: 45 | Fill #5

## 2017-05-02 ENCOUNTER — Encounter: Payer: Self-pay | Admitting: Family Medicine

## 2017-07-19 ENCOUNTER — Encounter: Payer: Self-pay | Admitting: Family Medicine

## 2017-07-19 ENCOUNTER — Other Ambulatory Visit: Payer: Self-pay | Admitting: Nurse Practitioner

## 2017-07-19 DIAGNOSIS — R002 Palpitations: Secondary | ICD-10-CM

## 2017-07-19 DIAGNOSIS — Z1231 Encounter for screening mammogram for malignant neoplasm of breast: Secondary | ICD-10-CM

## 2017-07-22 MED ORDER — ATENOLOL 25 MG PO TABS: 12.5000 mg | ORAL_TABLET | Freq: Every day | ORAL | 3 refills | Status: DC

## 2017-07-22 MED FILL — ATENOLOL 25 MG TABLET: 25 | 90 days supply | Qty: 45 | Fill #0

## 2017-07-30 DIAGNOSIS — M940 Chondrocostal junction syndrome [Tietze]: Secondary | ICD-10-CM | POA: Diagnosis not present

## 2017-08-14 ENCOUNTER — Ambulatory Visit (AMBULATORY_SURGERY_CENTER): Payer: Self-pay

## 2017-08-14 VITALS — Ht 64.0 in | Wt 139.0 lb

## 2017-08-14 DIAGNOSIS — Z800 Family history of malignant neoplasm of digestive organs: Secondary | ICD-10-CM

## 2017-08-14 DIAGNOSIS — Z8371 Family history of colonic polyps: Secondary | ICD-10-CM

## 2017-08-14 MED ORDER — NA SULFATE-K SULFATE-MG SULF 17.5-3.13-1.6 GM/177ML PO SOLN: 1.0000 | Freq: Once | ORAL | 0 refills | Status: AC

## 2017-08-14 MED FILL — SUPREP BOWEL PREP KIT: 17.5-3.13-1 | 1 days supply | Qty: 354 | Fill #0

## 2017-08-14 NOTE — Progress Notes (Signed)
Per pt, no allergies to soy or egg products.Pt not taking any weight loss meds or using  O2 at home.   Pt refused Emmi video. 

## 2017-08-16 ENCOUNTER — Ambulatory Visit (INDEPENDENT_AMBULATORY_CARE_PROVIDER_SITE_OTHER): Payer: 59 | Admitting: Family Medicine

## 2017-08-16 ENCOUNTER — Encounter: Payer: Self-pay | Admitting: Family Medicine

## 2017-08-16 VITALS — BP 98/66 | HR 80 | Temp 98.6°F | Ht 64.0 in | Wt 138.0 lb

## 2017-08-16 DIAGNOSIS — Z0000 Encounter for general adult medical examination without abnormal findings: Secondary | ICD-10-CM

## 2017-08-16 NOTE — Progress Notes (Addendum)
Subjective:     Patient ID: Barbara Carlson, female   DOB: 01-02-1961, 57 y.o.   MRN: [id number]  HPI Patient seen for physical exam. She sees gynecologist yearly and is in process of transitioning to new gynecologist. She has colonoscopy scheduled for next week. She's never had previous screening colonoscopy. She is getting regular Pap smears and mammograms. No history of hepatitis C screening. She's not had new shingles vaccine.  Never smoked. Tetanus up-to-date. Flu vaccine already given. History of recurrent palpitations but none recently. Episode of possible costochondritis back in the fall but currently stable  The 10-year ASCVD risk score Mikey Bussing DC Brooke Bonito., et al., 2013) is: 2.7%   Values used to calculate the score:     Age: 26 years     Sex: Female     Is Non-Hispanic African American: No     Diabetic: No     Tobacco smoker: No     Systolic Blood Pressure: 409 mmHg     Is BP treated: No     HDL Cholesterol: 43 mg/dL     Total Cholesterol: 274 mg/dL   Past Medical History:  Diagnosis Date  . Asthma    as a child  . Cancer ([place])    basal cell  . Costochondritis, acute    couple years ago  . Hyperlipidemia    diet controlled  . Other dyspnea and respiratory abnormality   . Palpitations   . Post-operative nausea and vomiting   . Tachycardia, unspecified    Past Surgical History:  Procedure Laterality Date  . BASAL CELL CARCINOMA EXCISION  2004   right  abd  . GANGLION CYST EXCISION  2015   right hand  . REFRACTIVE SURGERY  2003   Bil  . TONSILLECTOMY Bilateral 1987    reports that  has never smoked. she has never used smokeless tobacco. She reports that she does not drink alcohol or use drugs. family history includes Cancer (age of onset: 4) in her father; Colon cancer in her maternal aunt and paternal aunt; Colon polyps in her mother; Heart disease in her mother; Hyperlipidemia in her father; Hypertension in her daughter and father. No Known Allergies   Review of  Systems  Constitutional: Negative for activity change, appetite change, fatigue, fever and unexpected weight change.  HENT: Negative for ear pain, hearing loss, sore throat and trouble swallowing.   Eyes: Negative for visual disturbance.  Respiratory: Negative for cough and shortness of breath.   Cardiovascular: Negative for chest pain and palpitations.  Gastrointestinal: Negative for abdominal pain, blood in stool, constipation and diarrhea.  Genitourinary: Negative for dysuria and hematuria.  Musculoskeletal: Negative for arthralgias, back pain and myalgias.  Skin: Negative for rash.  Neurological: Negative for dizziness, syncope and headaches.  Hematological: Negative for adenopathy.  Psychiatric/Behavioral: Negative for confusion and dysphoric mood.       Objective:   Physical Exam  Constitutional: She is oriented to person, place, and time. She appears well-developed and well-nourished.  HENT:  Head: Normocephalic and atraumatic.  Eyes: EOM are normal. Pupils are equal, round, and reactive to light.  Neck: Normal range of motion. Neck supple. No thyromegaly present.  Cardiovascular: Normal rate, regular rhythm and normal heart sounds.  No murmur heard. Pulmonary/Chest: Breath sounds normal. No respiratory distress. She has no wheezes. She has no rales.  Abdominal: Soft. Bowel sounds are normal. She exhibits no distension and no mass. There is no tenderness. There is no rebound and no guarding.  Genitourinary:  Genitourinary Comments: Per GYN  Musculoskeletal: Normal range of motion. She exhibits no edema.  Lymphadenopathy:    She has no cervical adenopathy.  Neurological: She is alert and oriented to person, place, and time. She displays normal reflexes. No cranial nerve deficit.  Skin: No rash noted.  Psychiatric: She has a normal mood and affect. Her behavior is normal. Judgment and thought content normal.       Assessment:     Physical exam. The following issues were  addressed    Plan:     -Colonoscopy has already been scheduled -She will continue with regular GYN follow-up -Patient requesting new shingles vaccine and she will be placed on waiting list -Obtain screening lab work including hepatitis C antibody  Eulas Post MD [facility] at [facility]

## 2017-08-20 ENCOUNTER — Ambulatory Visit
Admission: RE | Admit: 2017-08-20 | Discharge: 2017-08-20 | Disposition: A | Discharge status: Home / Self Care | Payer: 59 | Source: Ambulatory Visit | Attending: Nurse Practitioner | Admitting: Nurse Practitioner

## 2017-08-20 DIAGNOSIS — Z1231 Encounter for screening mammogram for malignant neoplasm of breast: Secondary | ICD-10-CM | POA: Diagnosis not present

## 2017-08-23 ENCOUNTER — Ambulatory Visit (AMBULATORY_SURGERY_CENTER): Payer: 59 | Admitting: Gastroenterology

## 2017-08-23 ENCOUNTER — Other Ambulatory Visit: Payer: Self-pay

## 2017-08-23 ENCOUNTER — Encounter: Payer: Self-pay | Admitting: Gastroenterology

## 2017-08-23 VITALS — BP 112/60 | HR 87 | Temp 98.4°F | Resp 17 | Ht 64.0 in | Wt 139.0 lb

## 2017-08-23 DIAGNOSIS — Z1212 Encounter for screening for malignant neoplasm of rectum: Secondary | ICD-10-CM | POA: Diagnosis not present

## 2017-08-23 DIAGNOSIS — D120 Benign neoplasm of cecum: Secondary | ICD-10-CM

## 2017-08-23 DIAGNOSIS — D127 Benign neoplasm of rectosigmoid junction: Secondary | ICD-10-CM

## 2017-08-23 DIAGNOSIS — Z800 Family history of malignant neoplasm of digestive organs: Secondary | ICD-10-CM

## 2017-08-23 DIAGNOSIS — D123 Benign neoplasm of transverse colon: Secondary | ICD-10-CM

## 2017-08-23 DIAGNOSIS — Z8371 Family history of colonic polyps: Secondary | ICD-10-CM

## 2017-08-23 DIAGNOSIS — Z1211 Encounter for screening for malignant neoplasm of colon: Secondary | ICD-10-CM

## 2017-08-23 DIAGNOSIS — Z83719 Family history of colon polyps, unspecified: Secondary | ICD-10-CM

## 2017-08-23 MED ORDER — SODIUM CHLORIDE 0.9 % IV SOLN: 500.0000 mL | Freq: Once | INTRAVENOUS | Status: DC

## 2017-08-23 NOTE — Op Note (Signed)
[facility] Patient Name: Barbara Carlson Procedure Date: 08/23/2017 1:39 PM MRN: [id number] Endoscopist: Remo Lipps P. Suzanne Garbers MD, MD Age: 57 Referring MD:  Date of Birth: 07-Jan-1961 Gender: Female Account #: [id number] Procedure:                Colonoscopy Indications:              Colon cancer screening in patient at increased                            risk: Family history of colorectal cancer in                            multiple 2nd degree relatives, This is the                            patient's first colonoscopy Medicines:                Monitored Anesthesia Care Procedure:                Pre-Anesthesia Assessment:                           - Prior to the procedure, a History and Physical                            was performed, and patient medications and                            allergies were reviewed. The patient's tolerance of                            previous anesthesia was also reviewed. The risks                            and benefits of the procedure and the sedation                            options and risks were discussed with the patient.                            All questions were answered, and informed consent                            was obtained. Prior Anticoagulants: The patient has                            taken no previous anticoagulant or antiplatelet                            agents. ASA Grade Assessment: II - A patient with                            mild systemic disease. After reviewing the risks  and benefits, the patient was deemed in                            satisfactory condition to undergo the procedure.                           After obtaining informed consent, the colonoscope                            was passed under direct vision. Throughout the                            procedure, the patient's blood pressure, pulse, and                            oxygen saturations were monitored  continuously. The                            Colonoscope was introduced through the anus and                            advanced to the the cecum, identified by                            appendiceal orifice and ileocecal valve. The                            colonoscopy was performed without difficulty. The                            patient tolerated the procedure well. The quality                            of the bowel preparation was good. The ileocecal                            valve, appendiceal orifice, and rectum were                            photographed. Scope In: 1:51:26 PM Scope Out: 2:23:08 PM Scope Withdrawal Time: 0 hours 26 minutes 1 second  Total Procedure Duration: 0 hours 31 minutes 42 seconds  Findings:                 The perianal and digital rectal examinations were                            normal.                           Four sessile polyps were found in the cecum. The                            polyps were 3 to 5 mm in size. These polyps were  removed with a cold snare. Resection and retrieval                            were complete.                           A 5 mm polyp was found in the splenic flexure. The                            polyp was sessile. The polyp was removed with a                            cold snare. Resection and retrieval were complete.                           Two sessile polyps were found in the recto-sigmoid                            colon. The polyps were 3 to 4 mm in size. These                            polyps were removed with a cold snare. Resection                            and retrieval were complete.                           The colon was tortuous which prolonged the                            procedure.                           Anal papilla(e) were hypertrophied.                           The exam was otherwise without abnormality. Complications:            No immediate complications.  Estimated blood loss:                            Minimal. Estimated Blood Loss:     Estimated blood loss was minimal. Impression:               - Four 3 to 5 mm polyps in the cecum, removed with                            a cold snare. Resected and retrieved.                           - One 5 mm polyp at the splenic flexure, removed                            with a cold snare. Resected and retrieved.                           -  Two 3 to 4 mm polyps at the recto-sigmoid colon,                            removed with a cold snare. Resected and retrieved.                           - Tortuous colon.                           - Anal papilla(e) were hypertrophied.                           - The examination was otherwise normal. Recommendation:           - Patient has a contact number available for                            emergencies. The signs and symptoms of potential                            delayed complications were discussed with the                            patient. Return to normal activities tomorrow.                            Written discharge instructions were provided to the                            patient.                           - Resume previous diet.                           - Continue present medications.                           - Await pathology results.                           - Repeat colonoscopy is recommended for                            surveillance. The colonoscopy date will be                            determined after pathology results from today's                            exam become available for review.                           - No ibuprofen, naproxen, or other non-steroidal                            anti-inflammatory drugs  for 2 weeks after polyp                            removal. Remo Lipps P. Denny Mccree MD, MD 08/23/2017 2:29:29 PM This report has been signed electronically.

## 2017-08-23 NOTE — Patient Instructions (Signed)
Impression/Recommendations:  Polyp handout given to patient.  Resume previous diet. Continue present medications.  Repeat colonoscopy recommended for surveillance.  Date to be determined after pathology results reviewed.  No Ibuprofen, Naproxen, or other NSAID drugs for 2 weeks.  Tylenol only until 09/06/2017.  YOU HAD AN ENDOSCOPIC PROCEDURE TODAY AT [facility]:   Refer to the procedure report that was given to you for any specific questions about what was found during the examination.  If the procedure report does not answer your questions, please call your gastroenterologist to clarify.  If you requested that your care partner not be given the details of your procedure findings, then the procedure report has been included in a sealed envelope for you to review at your convenience later.  YOU SHOULD EXPECT: Some feelings of bloating in the abdomen. Passage of more gas than usual.  Walking can help get rid of the air that was put into your GI tract during the procedure and reduce the bloating. If you had a lower endoscopy (such as a colonoscopy or flexible sigmoidoscopy) you may notice spotting of blood in your stool or on the toilet paper. If you underwent a bowel prep for your procedure, you may not have a normal bowel movement for a few days.  Please Note:  You might notice some irritation and congestion in your nose or some drainage.  This is from the oxygen used during your procedure.  There is no need for concern and it should clear up in a day or so.  SYMPTOMS TO REPORT IMMEDIATELY:   Following lower endoscopy (colonoscopy or flexible sigmoidoscopy):  Excessive amounts of blood in the stool  Significant tenderness or worsening of abdominal pains  Swelling of the abdomen that is new, acute  Fever of 100F or higher For urgent or emergent issues, a gastroenterologist can be reached at any hour by calling [phone].   DIET:  We do recommend a small meal at  first, but then you may proceed to your regular diet.  Drink plenty of fluids but you should avoid alcoholic beverages for 24 hours.  ACTIVITY:  You should plan to take it easy for the rest of today and you should NOT DRIVE or use heavy machinery until tomorrow (because of the sedation medicines used during the test).    FOLLOW UP: Our staff will call the number listed on your records the next business day following your procedure to check on you and address any questions or concerns that you may have regarding the information given to you following your procedure. If we do not reach you, we will leave a message.  However, if you are feeling well and you are not experiencing any problems, there is no need to return our call.  We will assume that you have returned to your regular daily activities without incident.  If any biopsies were taken you will be contacted by phone or by letter within the next 1-3 weeks.  Please call us at [phone] if you have not heard about the biopsies in 3 weeks.    SIGNATURES/CONFIDENTIALITY: You and/or your care partner have signed paperwork which will be entered into your electronic medical record.  These signatures attest to the fact that that the information above on your After Visit Summary has been reviewed and is understood.  Full responsibility of the confidentiality of this discharge information lies with you and/or your care-partner.

## 2017-08-23 NOTE — Progress Notes (Signed)
Called to room to assist during endoscopic procedure.  Patient ID and intended procedure confirmed with present staff. Received instructions for my participation in the procedure from the performing physician.  

## 2017-08-23 NOTE — Progress Notes (Signed)
Pt. Walked around unit several times, went to restroom, was able to pass air.  Rocked on hands and knees on the stretcher.  Pt. Comfortable at discharge, instructions given on managing any discomfort that may occur at home.

## 2017-08-23 NOTE — Progress Notes (Signed)
Pt's states no medical or surgical changes since previsit or office visit. 

## 2017-08-23 NOTE — Progress Notes (Signed)
To PACU VSS. Report to [email or web address] 

## 2017-08-26 ENCOUNTER — Telehealth: Payer: Self-pay

## 2017-08-26 NOTE — Telephone Encounter (Signed)
  Follow up Call-  Call back number 08/23/2017  Post procedure Call Back phone  # [phone]  Permission to leave phone message Yes  Some recent data might be hidden     Patient questions:  Do you have a fever, pain , or abdominal swelling? No. Pain Score  0 *  Have you tolerated food without any problems? Yes.    Have you been able to return to your normal activities? Yes.    Do you have any questions about your discharge instructions: Diet   No. Medications  No. Follow up visit  No.  Do you have questions or concerns about your Care? No.  Actions: * If pain score is 4 or above: No action needed, pain <4.

## 2017-08-27 ENCOUNTER — Other Ambulatory Visit (INDEPENDENT_AMBULATORY_CARE_PROVIDER_SITE_OTHER): Payer: 59

## 2017-08-27 ENCOUNTER — Encounter: Payer: Self-pay | Admitting: Family Medicine

## 2017-08-27 DIAGNOSIS — Z0000 Encounter for general adult medical examination without abnormal findings: Secondary | ICD-10-CM | POA: Diagnosis not present

## 2017-08-27 LAB — LIPID PANEL
CHOLESTEROL: 274 mg/dL — AB (ref 0–200)
HDL: 43 mg/dL (ref >39.00)
NonHDL: 230.88
TRIGLYCERIDES: 257 mg/dL — AB (ref 0.0–149.0)
Total CHOL/HDL Ratio: 6
VLDL: 51.4 mg/dL — ABNORMAL HIGH (ref 0.0–40.0)

## 2017-08-27 LAB — HEPATIC FUNCTION PANEL
ALK PHOS: 72 U/L (ref 39–117)
ALT: 13 U/L (ref 0–35)
AST: 16 U/L (ref 0–37)
Albumin: 4.2 g/dL (ref 3.5–5.2)
BILIRUBIN DIRECT: 0.1 mg/dL (ref 0.0–0.3)
BILIRUBIN TOTAL: 0.5 mg/dL (ref 0.2–1.2)
TOTAL PROTEIN: 7 g/dL (ref 6.0–8.3)

## 2017-08-27 LAB — CBC WITH DIFFERENTIAL/PLATELET
BASOS PCT: 0.9 % (ref 0.0–3.0)
Basophils Absolute: 0 10*3/uL (ref 0.0–0.1)
EOS ABS: 0.3 10*3/uL (ref 0.0–0.7)
EOS PCT: 4.9 % (ref 0.0–5.0)
HCT: 38.6 % (ref 36.0–46.0)
HEMOGLOBIN: 12.7 g/dL (ref 12.0–15.0)
LYMPHS ABS: 2.3 10*3/uL (ref 0.7–4.0)
Lymphocytes Relative: 44.1 % (ref 12.0–46.0)
MCHC: 33 g/dL (ref 30.0–36.0)
MCV: 91.2 fl (ref 78.0–100.0)
MONO ABS: 0.4 10*3/uL (ref 0.1–1.0)
Monocytes Relative: 7.9 % (ref 3.0–12.0)
NEUTROS ABS: 2.2 10*3/uL (ref 1.4–7.7)
Neutrophils Relative %: 42.2 % — ABNORMAL LOW (ref 43.0–77.0)
PLATELETS: 354 10*3/uL (ref 150.0–400.0)
RBC: 4.23 Mil/uL (ref 3.87–5.11)
RDW: 12.5 % (ref 11.5–15.5)
WBC: 5.2 10*3/uL (ref 4.0–10.5)

## 2017-08-27 LAB — BASIC METABOLIC PANEL
BUN: 18 mg/dL (ref 6–23)
CHLORIDE: 103 meq/L (ref 96–112)
CO2: 30 meq/L (ref 19–32)
CREATININE: 0.82 mg/dL (ref 0.40–1.20)
Calcium: 9.6 mg/dL (ref 8.4–10.5)
GFR: 76.7 mL/min (ref >60.00)
Glucose, Bld: 94 mg/dL (ref 70–99)
POTASSIUM: 4.6 meq/L (ref 3.5–5.1)
Sodium: 140 mEq/L (ref 135–145)

## 2017-08-27 LAB — TSH: TSH: 4.37 u[IU]/mL (ref 0.35–4.50)

## 2017-08-27 LAB — LDL CHOLESTEROL, DIRECT: Direct LDL: 175 mg/dL

## 2017-08-28 ENCOUNTER — Encounter: Payer: Self-pay | Admitting: Family Medicine

## 2017-08-28 LAB — HEPATITIS C ANTIBODY
HEP C AB: NONREACTIVE
SIGNAL TO CUT-OFF: 0.03 (ref <1.00)

## 2017-09-02 ENCOUNTER — Encounter: Payer: Self-pay | Admitting: Gastroenterology

## 2017-09-03 ENCOUNTER — Ambulatory Visit: Payer: 59

## 2017-09-04 ENCOUNTER — Ambulatory Visit (INDEPENDENT_AMBULATORY_CARE_PROVIDER_SITE_OTHER): Payer: 59 | Admitting: Family Medicine

## 2017-09-04 DIAGNOSIS — Z23 Encounter for immunization: Secondary | ICD-10-CM

## 2017-10-18 MED FILL — ATENOLOL 25 MG TABLET: 25 | 90 days supply | Qty: 45 | Fill #1

## 2018-01-21 MED FILL — ATENOLOL 25 MG TABLET: 25 | 90 days supply | Qty: 45 | Fill #2

## 2018-01-22 ENCOUNTER — Ambulatory Visit (INDEPENDENT_AMBULATORY_CARE_PROVIDER_SITE_OTHER): Payer: 59 | Admitting: *Deleted

## 2018-01-22 DIAGNOSIS — Z23 Encounter for immunization: Secondary | ICD-10-CM

## 2018-01-22 NOTE — Progress Notes (Signed)
Per orders of Dr. Burchette, injection of Shingrix given by Josalynn Johndrow J Garrin Kirwan. Patient tolerated injection well. 

## 2018-03-31 DIAGNOSIS — Z6824 Body mass index (BMI) 24.0-24.9, adult: Secondary | ICD-10-CM | POA: Diagnosis not present

## 2018-03-31 DIAGNOSIS — Z01419 Encounter for gynecological examination (general) (routine) without abnormal findings: Secondary | ICD-10-CM | POA: Diagnosis not present

## 2018-04-02 MED FILL — FLUCONAZOLE 150 MG TABS: 150 | 1 days supply | Qty: 1 | Fill #0

## 2018-04-09 ENCOUNTER — Encounter: Payer: Self-pay | Admitting: Family Medicine

## 2018-04-10 MED FILL — INTRAROSA 6.5 MG VAG INSERT: 6.5 | 28 days supply | Qty: 28 | Fill #0

## 2018-04-10 NOTE — Progress Notes (Signed)
Chief Complaint  Patient presents with  . Back Pain    Pt states that she thinks she pulled a muscle in her left lower back after working in yard 2 weeks ago. No leg tingling just pain which radiates down the leg occasionally. Pt was away at a Conference this past week and she feels all the walking caused it to flare. Using Naprosyn and Tylenol and heat.    HPI: Barbara Carlson 57 y.o. come in for sda  PCP NA   Onset after spreading pine needed  and got better but then worse last week desk .    Flared  After travelnaproxyn. Still hurts .  No radiation weakness but aggravated by sitting.  Since this is been going on for a while asked for help with intervention.  No history of previous significant trauma. Not    No reg pt x walking.   ROS: See pertinent positives and negatives per HPI.  No fever or UTI symptoms or other alarm symptoms.  Past Medical History:  Diagnosis Date  . Asthma    as a child  . Cancer ([place])    basal cell  . Costochondritis, acute    couple years ago  . Hyperlipidemia    diet controlled  . Other dyspnea and respiratory abnormality   . Palpitations   . Post-operative nausea and vomiting   . Tachycardia, unspecified     Family History  Problem Relation Age of Onset  . Heart disease Mother        Atrial fibrillation  . Colon polyps Mother   . Cancer Father 23       lung  . Hyperlipidemia Father   . Hypertension Father   . Hypertension Daughter   . Colon cancer Maternal Aunt   . Colon cancer Paternal Aunt     Social History   Socioeconomic History  . Marital status: Married    Spouse name: Not on file  . Number of children: Not on file  . Years of education: Not on file  . Highest education level: Not on file  Occupational History  . Occupation: Tax inspector: [employer]  Social Needs  . Financial resource strain: Not on file  . Food insecurity:    Worry: Not on file    Inability: Not on file  . Transportation needs:   Medical: Not on file    Non-medical: Not on file  Tobacco Use  . Smoking status: Never Smoker  . Smokeless tobacco: Never Used  Substance and Sexual Activity  . Alcohol use: No  . Drug use: No  . Sexual activity: Not on file  Lifestyle  . Physical activity:    Days per week: Not on file    Minutes per session: Not on file  . Stress: Not on file  Relationships  . Social connections:    Talks on phone: Not on file    Gets together: Not on file    Attends religious service: Not on file    Active member of club or organization: Not on file    Attends meetings of clubs or organizations: Not on file    Relationship status: Not on file  Other Topics Concern  . Not on file  Social History Narrative  . Not on file    Outpatient Medications Prior to Visit  Medication Sig Dispense Refill  . atenolol (TENORMIN) 25 MG tablet Take 0.5 tablets (12.5 mg total) by mouth daily. 45 tablet 3  .  Calcium Carb-Cholecalciferol (CALCIUM 600 + D PO) Take by mouth daily.    . cetirizine (ZYRTEC) 10 MG tablet Take 10 mg by mouth daily.     . Cholecalciferol (VITAMIN D3) 2000 units TABS Take by mouth daily.    [name] Dextromethorphan Polistirex (DELSYM PO) Take 120 mg by mouth as needed.    . Multiple Vitamins-Calcium (ONE-A-DAY WOMENS PO) Take by mouth daily.    . pseudoephedrine (SUDAFED) 30 MG tablet Take 30 mg by mouth daily.     . Pseudoephedrine-Guaifenesin (MUCINEX D PO) Take 400 mg by mouth as needed.     . vitamin C (ASCORBIC ACID) 500 MG tablet Take 500 mg by mouth daily.     Facility-Administered Medications Prior to Visit  Medication Dose Route Frequency Provider Last Rate Last Dose  . 0.9 %  sodium chloride infusion  500 mL Intravenous Once Armbruster, Carlota Raspberry, MD         EXAM:  BP 114/64 (BP Location: Right Arm, Patient Position: Sitting, Cuff Size: Normal)   Pulse (!) 106   Temp 98.2 F (36.8 C) (Oral)   Wt 142 lb 8 oz (64.6 kg)   LMP 09/05/2010   BMI 24.46 kg/m   Body mass  index is 24.46 kg/m.  GENERAL: vitals reviewed and listed above, alert, oriented, appears well hydrated and in no acute distress HEENT: atraumatic, conjunctiva  clear, no obvious abnormalities on inspection of external nose and ears NECK: no obvious masses on inspection palpation  MS: moves all extremities without noticeable focal  abnormality back without point tenderness discomfort in the lower paraspinal muscles no obvious scoliosis can stand on either leg toe heel walk is normal. PSYCH: pleasant and cooperative, no obvious depression or anxiety  BP Readings from Last 3 Encounters:  04/11/18 114/64  08/23/17 112/60  08/16/17 98/66    ASSESSMENT AND PLAN:  Discussed the following assessment and plan:  Midline low back pain without sciatica, unspecified chronicity - Plan: Ambulatory referral to Physical Therapy Acute back strain persistent symptoms can try mild muscle relaxant temporarily at night plan physical therapy referral as discussed.  Risk-benefit of medicines discussed.  No alarm symptoms for high risk situation. -Patient advised to return or notify health care team  if  new concerns arise.  Patient Instructions  Agree this is mechanical pain .  Will send in   Muscle relaxant for help.  PT  Referral as discussed.      Back Exercises The following exercises strengthen the muscles that help to support the back. They also help to keep the lower back flexible. Doing these exercises can help to prevent back pain or lessen existing pain. If you have back pain or discomfort, try doing these exercises 2-3 times each day or as told by your health care provider. When the pain goes away, do them once each day, but increase the number of times that you repeat the steps for each exercise (do more repetitions). If you do not have back pain or discomfort, do these exercises once each day or as told by your health care provider. Exercises Single Knee to Chest  Repeat these steps 3-5  times for each leg: 1. Lie on your back on a firm bed or the floor with your legs extended. 2. Bring one knee to your chest. Your other leg should stay extended and in contact with the floor. 3. Hold your knee in place by grabbing your knee or thigh. 4. Pull on your knee until you feel a  gentle stretch in your lower back. 5. Hold the stretch for 10-30 seconds. 6. Slowly release and straighten your leg.  Pelvic Tilt  Repeat these steps 5-10 times: 1. Lie on your back on a firm bed or the floor with your legs extended. 2. Bend your knees so they are pointing toward the ceiling and your feet are flat on the floor. 3. Tighten your lower abdominal muscles to press your lower back against the floor. This motion will tilt your pelvis so your tailbone points up toward the ceiling instead of pointing to your feet or the floor. 4. With gentle tension and even breathing, hold this position for 5-10 seconds.  Cat-Cow  Repeat these steps until your lower back becomes more flexible: 1. Get into a hands-and-knees position on a firm surface. Keep your hands under your shoulders, and keep your knees under your hips. You may place padding under your knees for comfort. 2. Let your head hang down, and point your tailbone toward the floor so your lower back becomes rounded like the back of a cat. 3. Hold this position for 5 seconds. 4. Slowly lift your head and point your tailbone up toward the ceiling so your back forms a sagging arch like the back of a cow. 5. Hold this position for 5 seconds.  Press-Ups  Repeat these steps 5-10 times: 1. Lie on your abdomen (face-down) on the floor. 2. Place your palms near your head, about shoulder-width apart. 3. While you keep your back as relaxed as possible and keep your hips on the floor, slowly straighten your arms to raise the top half of your body and lift your shoulders. Do not use your back muscles to raise your upper torso. You may adjust the placement of  your hands to make yourself more comfortable. 4. Hold this position for 5 seconds while you keep your back relaxed. 5. Slowly return to lying flat on the floor.  Bridges  Repeat these steps 10 times: 1. Lie on your back on a firm surface. 2. Bend your knees so they are pointing toward the ceiling and your feet are flat on the floor. 3. Tighten your buttocks muscles and lift your buttocks off of the floor until your waist is at almost the same height as your knees. You should feel the muscles working in your buttocks and the back of your thighs. If you do not feel these muscles, slide your feet 1-2 inches farther away from your buttocks. 4. Hold this position for 3-5 seconds. 5. Slowly lower your hips to the starting position, and allow your buttocks muscles to relax completely.  If this exercise is too easy, try doing it with your arms crossed over your chest. Abdominal Crunches  Repeat these steps 5-10 times: 1. Lie on your back on a firm bed or the floor with your legs extended. 2. Bend your knees so they are pointing toward the ceiling and your feet are flat on the floor. 3. Cross your arms over your chest. 4. Tip your chin slightly toward your chest without bending your neck. 5. Tighten your abdominal muscles and slowly raise your trunk (torso) high enough to lift your shoulder blades a tiny bit off of the floor. Avoid raising your torso higher than that, because it can put too much stress on your low back and it does not help to strengthen your abdominal muscles. 6. Slowly return to your starting position.  Back Lifts Repeat these steps 5-10 times: 1. Lie on your abdomen (  face-down) with your arms at your sides, and rest your forehead on the floor. 2. Tighten the muscles in your legs and your buttocks. 3. Slowly lift your chest off of the floor while you keep your hips pressed to the floor. Keep the back of your head in line with the curve in your back. Your eyes should be looking at  the floor. 4. Hold this position for 3-5 seconds. 5. Slowly return to your starting position.  Contact a health care provider if:  Your back pain or discomfort gets much worse when you do an exercise.  Your back pain or discomfort does not lessen within 2 hours after you exercise. If you have any of these problems, stop doing these exercises right away. Do not do them again unless your health care provider says that you can. Get help right away if:  You develop sudden, severe back pain. If this happens, stop doing the exercises right away. Do not do them again unless your health care provider says that you can. This information is not intended to replace advice given to you by your health care provider. Make sure you discuss any questions you have with your health care provider. Document Released: 09/06/2004 Document Revised: 12/07/2015 Document Reviewed: 09/23/2014 Elsevier Interactive Patient Education  2017 [place] K. Mechelle Pates M.D.

## 2018-04-11 ENCOUNTER — Encounter: Payer: Self-pay | Admitting: Internal Medicine

## 2018-04-11 ENCOUNTER — Ambulatory Visit (INDEPENDENT_AMBULATORY_CARE_PROVIDER_SITE_OTHER): Payer: 59 | Admitting: Internal Medicine

## 2018-04-11 VITALS — BP 114/64 | HR 106 | Temp 98.2°F | Wt 142.5 lb

## 2018-04-11 DIAGNOSIS — M545 Low back pain, unspecified: Secondary | ICD-10-CM

## 2018-04-11 DIAGNOSIS — G8929 Other chronic pain: Secondary | ICD-10-CM | POA: Insufficient documentation

## 2018-04-11 DIAGNOSIS — M5442 Lumbago with sciatica, left side: Secondary | ICD-10-CM | POA: Insufficient documentation

## 2018-04-11 MED ORDER — CYCLOBENZAPRINE HCL 5 MG PO TABS: 5.0000 mg | ORAL_TABLET | Freq: Three times a day (TID) | ORAL | 1 refills | Status: DC | PRN

## 2018-04-11 MED FILL — CYCLOBENZAPRINE 5 MG TABLET: 5 | 7 days supply | Qty: 20 | Fill #0

## 2018-04-11 NOTE — Patient Instructions (Addendum)
Agree this is mechanical pain .  Will send in   Muscle relaxant for help.  PT  Referral as discussed.      Back Exercises The following exercises strengthen the muscles that help to support the back. They also help to keep the lower back flexible. Doing these exercises can help to prevent back pain or lessen existing pain. If you have back pain or discomfort, try doing these exercises 2-3 times each day or as told by your health care provider. When the pain goes away, do them once each day, but increase the number of times that you repeat the steps for each exercise (do more repetitions). If you do not have back pain or discomfort, do these exercises once each day or as told by your health care provider. Exercises Single Knee to Chest  Repeat these steps 3-5 times for each leg: 1. Lie on your back on a firm bed or the floor with your legs extended. 2. Bring one knee to your chest. Your other leg should stay extended and in contact with the floor. 3. Hold your knee in place by grabbing your knee or thigh. 4. Pull on your knee until you feel a gentle stretch in your lower back. 5. Hold the stretch for 10-30 seconds. 6. Slowly release and straighten your leg.  Pelvic Tilt  Repeat these steps 5-10 times: 1. Lie on your back on a firm bed or the floor with your legs extended. 2. Bend your knees so they are pointing toward the ceiling and your feet are flat on the floor. 3. Tighten your lower abdominal muscles to press your lower back against the floor. This motion will tilt your pelvis so your tailbone points up toward the ceiling instead of pointing to your feet or the floor. 4. With gentle tension and even breathing, hold this position for 5-10 seconds.  Cat-Cow  Repeat these steps until your lower back becomes more flexible: 1. Get into a hands-and-knees position on a firm surface. Keep your hands under your shoulders, and keep your knees under your hips. You may place padding under  your knees for comfort. 2. Let your head hang down, and point your tailbone toward the floor so your lower back becomes rounded like the back of a cat. 3. Hold this position for 5 seconds. 4. Slowly lift your head and point your tailbone up toward the ceiling so your back forms a sagging arch like the back of a cow. 5. Hold this position for 5 seconds.  Press-Ups  Repeat these steps 5-10 times: 1. Lie on your abdomen (face-down) on the floor. 2. Place your palms near your head, about shoulder-width apart. 3. While you keep your back as relaxed as possible and keep your hips on the floor, slowly straighten your arms to raise the top half of your body and lift your shoulders. Do not use your back muscles to raise your upper torso. You may adjust the placement of your hands to make yourself more comfortable. 4. Hold this position for 5 seconds while you keep your back relaxed. 5. Slowly return to lying flat on the floor.  Bridges  Repeat these steps 10 times: 1. Lie on your back on a firm surface. 2. Bend your knees so they are pointing toward the ceiling and your feet are flat on the floor. 3. Tighten your buttocks muscles and lift your buttocks off of the floor until your waist is at almost the same height as your knees. You should  feel the muscles working in your buttocks and the back of your thighs. If you do not feel these muscles, slide your feet 1-2 inches farther away from your buttocks. 4. Hold this position for 3-5 seconds. 5. Slowly lower your hips to the starting position, and allow your buttocks muscles to relax completely.  If this exercise is too easy, try doing it with your arms crossed over your chest. Abdominal Crunches  Repeat these steps 5-10 times: 1. Lie on your back on a firm bed or the floor with your legs extended. 2. Bend your knees so they are pointing toward the ceiling and your feet are flat on the floor. 3. Cross your arms over your chest. 4. Tip your chin  slightly toward your chest without bending your neck. 5. Tighten your abdominal muscles and slowly raise your trunk (torso) high enough to lift your shoulder blades a tiny bit off of the floor. Avoid raising your torso higher than that, because it can put too much stress on your low back and it does not help to strengthen your abdominal muscles. 6. Slowly return to your starting position.  Back Lifts Repeat these steps 5-10 times: 1. Lie on your abdomen (face-down) with your arms at your sides, and rest your forehead on the floor. 2. Tighten the muscles in your legs and your buttocks. 3. Slowly lift your chest off of the floor while you keep your hips pressed to the floor. Keep the back of your head in line with the curve in your back. Your eyes should be looking at the floor. 4. Hold this position for 3-5 seconds. 5. Slowly return to your starting position.  Contact a health care provider if:  Your back pain or discomfort gets much worse when you do an exercise.  Your back pain or discomfort does not lessen within 2 hours after you exercise. If you have any of these problems, stop doing these exercises right away. Do not do them again unless your health care provider says that you can. Get help right away if:  You develop sudden, severe back pain. If this happens, stop doing the exercises right away. Do not do them again unless your health care provider says that you can. This information is not intended to replace advice given to you by your health care provider. Make sure you discuss any questions you have with your health care provider. Document Released: 09/06/2004 Document Revised: 12/07/2015 Document Reviewed: 09/23/2014 Elsevier Interactive Patient Education  2017 [employer].

## 2018-04-18 ENCOUNTER — Ambulatory Visit: Payer: 59 | Attending: Internal Medicine | Admitting: Physical Therapy

## 2018-04-18 ENCOUNTER — Encounter: Payer: Self-pay | Admitting: Physical Therapy

## 2018-04-18 ENCOUNTER — Other Ambulatory Visit: Payer: Self-pay

## 2018-04-18 DIAGNOSIS — M6281 Muscle weakness (generalized): Secondary | ICD-10-CM | POA: Diagnosis not present

## 2018-04-18 DIAGNOSIS — R293 Abnormal posture: Secondary | ICD-10-CM | POA: Insufficient documentation

## 2018-04-18 DIAGNOSIS — M62830 Muscle spasm of back: Secondary | ICD-10-CM | POA: Diagnosis not present

## 2018-04-18 MED FILL — CYCLOBENZAPRINE 5 MG TABLET: 5 | 7 days supply | Qty: 20 | Fill #1

## 2018-04-18 MED FILL — ATENOLOL 25 MG TABLET: 25 | 90 days supply | Qty: 45 | Fill #3

## 2018-04-18 NOTE — Patient Instructions (Signed)
Access Code: [email or web address]  URL: [email or web address]  Date: 04/18/2018  Prepared by: Lovett Calender   Exercises  Supine Single Knee to Chest Stretch - 5 reps - 1 sets - 10 sec hold - 2x daily - 7x weekly  Left Standing Lateral Shift Correction at [place] - 10 reps - 1 sets - 2x daily - 7x weekly  Patient Education  Office Posture

## 2018-04-18 NOTE — Therapy (Signed)
[facility] [address], [place], [place], [address] Phone: [phone]   Fax:  [phone]  Physical Therapy Evaluation  Patient Details  Name: Barbara Carlson MRN: [id number] Date of Birth: 03/18/1961 Referring Provider: Burnis Medin, MD   Encounter Date: 04/18/2018  PT End of Session - 04/18/18 0755    Visit Number  1    Date for PT Re-Evaluation  05/30/18    PT Start Time  2633    PT Stop Time  0836    PT Time Calculation (min)  41 min    Activity Tolerance  Patient limited by pain;Patient tolerated treatment well    Behavior During Therapy  [facility] for tasks assessed/performed       Past Medical History:  Diagnosis Date  . Asthma    as a child  . Cancer ([place])    basal cell  . Costochondritis, acute    couple years ago  . Hyperlipidemia    diet controlled  . Other dyspnea and respiratory abnormality   . Palpitations   . Post-operative nausea and vomiting   . Tachycardia, unspecified     Past Surgical History:  Procedure Laterality Date  . BASAL CELL CARCINOMA EXCISION  2004   right  abd  . GANGLION CYST EXCISION  2015   right hand  . REFRACTIVE SURGERY  2003   Bil  . TONSILLECTOMY Bilateral 1987    There were no vitals filed for this visit.   Subjective Assessment - 04/18/18 0757    Subjective  I did yardwork a few weeks ago and it started getting a little better. Then had to travel for [place] and all the walking and sitting flared it back up.  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    Limitations  Sitting;Standing;Walking    Patient Stated Goals  get rid of pain    Currently in Pain?  Yes    Pain Score  5    up to 7/10   Pain Location  Back    Pain Orientation  Left;Lower    Pain Descriptors / Indicators  Spasm;Tightness    Pain Type  Acute pain    Pain Onset  1 to 4 weeks ago    Pain Frequency  Intermittent    Aggravating Factors   staying one place then moving     Pain Relieving Factors  ice and heat, naperson, flexeril - not doing much    Effect of Pain on Daily Activities  pain during all activities, bending to put something in the dishwasher    Multiple Pain Sites  No         [facility] PT Assessment - 04/18/18 0001      Assessment   Medical Diagnosis  M54.5 (ICD-10-CM) - Midline low back pain without sciatica, unspecified chronicity    Referring Provider  Burnis Medin, MD    Onset Date/Surgical Date  03/29/18    Prior Therapy  No      Precautions   Precautions  None      Restrictions   Weight Bearing Restrictions  No      Balance Screen   Has the patient fallen in the past 6 months  No      [name] residence    Living Arrangements  Spouse/significant other      Prior Function   Level of Independence  Independent      Cognition  Overall Cognitive Status  Within Functional Limits for tasks assessed      Observation/Other Assessments   Focus on Therapeutic Outcomes (FOTO)   53% limited      Posture/Postural Control   Posture/Postural Control  Postural limitations    Postural Limitations  Rounded Shoulders;Decreased lumbar lordosis;Weight shift right      ROM / Strength   AROM / PROM / Strength  AROM;Strength      AROM   Overall AROM Comments  lumbar flexion and left SB limited and painful      Strength   Strength Assessment Site  Hip    Right/Left Hip  Right;Left    Right Hip Flexion  5/5    Right Hip Extension  5/5    Right Hip External Rotation   5/5    Right Hip Internal Rotation  5/5    Right Hip ABduction  5/5    Left Hip Flexion  4+/5    Left Hip Extension  4+/5    Left Hip External Rotation  4+/5    Left Hip Internal Rotation  4+/5    Left Hip ABduction  4+/5      Flexibility   Soft Tissue Assessment /Muscle Length  yes    Hamstrings  tight on Rt LE >Lt      Palpation   SI assessment   no obliquities noted    Palpation comment  tight and tender left QL, Lt  lumbar parapsinals, left glutes      Special Tests    Special Tests  Lumbar    Lumbar Tests  Straight Leg Raise      Straight Leg Raise   Findings  Positive    Side   Left      Transfers   Comments  sit to stand very stiff and guarded with, no trunk flexion      Ambulation/Gait   Gait Pattern  Decreased stride length   guarded               Objective measurements completed on examination: See above findings.      [facility] PT Treatment/Exercise - 04/18/18 0001      Self-Care   Self-Care  Other Self-Care Comments    Other Self-Care Comments   educated on towel roll for posture; initial stretching, lateral shift             PT Education - 04/18/18 0838    Education Details   Access Code: [email or web address]     Person(s) Educated  Patient    Methods  Explanation;Demonstration;Handout    Comprehension  Verbalized understanding;Returned demonstration                  Plan - 04/18/18 6160    Clinical Impression Statement  Pt presents to clinic with low back pain due to low back pain.  Pt has limited lumbar flexion and pain with flexion and left SB. Pt is having pain on left that occasionally runs into left hip and down anterior thigh to the knee.  Pt has decreased lordosis with L2/3 hypomobilty and decreased extension mobility.  Pt has very tight lumbar paraspinals and QL on the left.  Pt has positive ASLR, no pain with PSLR.  Pt has weakness throughout left LE.  Pt has lateral shift to the right.  Posture and gait abnormalities are present as mentioned above.  She is guarded with sit to stand and using UE support to perform transfers.  Pt will benefit from skilled  PT to address impairments and return to maximum function.    Clinical Presentation  Stable    Clinical Presentation due to:  pt is stable, has not worsened since onset    Clinical Decision Making  Low    Rehab Potential  Excellent    PT Frequency  2x / week    PT Duration  8 weeks    PT  Treatment/Interventions  ADLs/Self Care Home Management;Biofeedback;Electrical Stimulation;Iontophoresis 4mg /ml Dexamethasone;Moist Heat;Traction;Therapeutic activities;Therapeutic exercise;Neuromuscular re-education;Patient/family education;Manual techniques;Dry needling;Passive range of motion;Taping    PT Next Visit Plan  manual and modalities for pain, DN discuss if approprioate, lumbar extension and lateral shift, lumbar ROM and initial core strengthening    PT Home Exercise Plan   Access Code: [email or web address]     Recommended Other Services  eval 04/18/18    Consulted and Agree with Plan of Care  Patient       Patient will benefit from skilled therapeutic intervention in order to improve the following deficits and impairments:  Abnormal gait, Pain, Increased fascial restricitons, Increased muscle spasms, Postural dysfunction, Decreased strength, Decreased range of motion, Difficulty walking  Visit Diagnosis: Muscle spasm of back  Muscle weakness (generalized)  Abnormal posture     Problem List Patient Active Problem List   Diagnosis Date Noted  . Midline low back pain without sciatica 04/11/2018  . HYPERLIPIDEMIA-MIXED 09/25/2010  . TACHYCARDIA 11/19/2008  . PALPITATIONS 11/19/2008    Zannie Cove, PT 04/18/2018, 12:54 PM  [facility] [address], [place], [place], [address] Phone: [phone]   Fax:  [phone]  Name: Barbara Carlson MRN: [id number] Date of Birth: 08-May-1961

## 2018-04-18 NOTE — Addendum Note (Signed)
Addended by: Lovett Calender D on: 04/18/2018 01:08 PM   Modules accepted: Orders

## 2018-04-18 NOTE — Therapy (Signed)
[facility] [address] [place], [place], [address] Phone: [phone]   Fax:  [phone]  Physical Therapy Evaluation  Patient Details  Name: Barbara Carlson MRN: [id number] Date of Birth: 1961/03/30 Referring Provider: Burnis Medin, MD   Encounter Date: 04/18/2018  PT End of Session - 04/18/18 0755    Visit Number  1    Date for PT Re-Evaluation  06/13/18    PT Start Time  6010    PT Stop Time  0836    PT Time Calculation (min)  41 min    Activity Tolerance  Patient limited by pain;Patient tolerated treatment well    Behavior During Therapy  [facility] for tasks assessed/performed       Past Medical History:  Diagnosis Date  . Asthma    as a child  . Cancer ([place])    basal cell  . Costochondritis, acute    couple years ago  . Hyperlipidemia    diet controlled  . Other dyspnea and respiratory abnormality   . Palpitations   . Post-operative nausea and vomiting   . Tachycardia, unspecified     Past Surgical History:  Procedure Laterality Date  . BASAL CELL CARCINOMA EXCISION  2004   right  abd  . GANGLION CYST EXCISION  2015   right hand  . REFRACTIVE SURGERY  2003   Bil  . TONSILLECTOMY Bilateral 1987    There were no vitals filed for this visit.   Subjective Assessment - 04/18/18 0757    Subjective  I did yardwork a few weeks ago and it started getting a little better. Then had to travel for [place] and all the walking and sitting flared it back up.  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    Limitations  Sitting;Standing;Walking    Patient Stated Goals  get rid of pain    Currently in Pain?  Yes    Pain Score  5    up to 7/10   Pain Location  Back    Pain Orientation  Left;Lower    Pain Descriptors / Indicators  Spasm;Tightness    Pain Type  Acute pain    Pain Onset  1 to 4 weeks ago    Pain Frequency  Intermittent    Aggravating Factors   staying one place then moving     Pain Relieving Factors  ice and heat, naperson, flexeril - not doing much    Effect of Pain on Daily Activities  pain during all activities, bending to put something in the dishwasher    Multiple Pain Sites  No         [facility] PT Assessment - 04/18/18 0001      Assessment   Medical Diagnosis  M54.5 (ICD-10-CM) - Midline low back pain without sciatica, unspecified chronicity    Referring Provider  Burnis Medin, MD    Onset Date/Surgical Date  03/29/18    Prior Therapy  No      Precautions   Precautions  None      Restrictions   Weight Bearing Restrictions  No      Balance Screen   Has the patient fallen in the past 6 months  No      [place] residence    Living Arrangements  Spouse/significant other      Prior Function   Level of Independence  Independent      Cognition  Overall Cognitive Status  Within Functional Limits for tasks assessed      Observation/Other Assessments   Focus on Therapeutic Outcomes (FOTO)   53% limited      Posture/Postural Control   Posture/Postural Control  Postural limitations    Postural Limitations  Rounded Shoulders;Decreased lumbar lordosis;Weight shift right      ROM / Strength   AROM / PROM / Strength  AROM;Strength      AROM   Overall AROM Comments  lumbar flexion and left SB limited and painful      Strength   Strength Assessment Site  Hip    Right/Left Hip  Right;Left    Right Hip Flexion  5/5    Right Hip Extension  5/5    Right Hip External Rotation   5/5    Right Hip Internal Rotation  5/5    Right Hip ABduction  5/5    Left Hip Flexion  4+/5    Left Hip Extension  4+/5    Left Hip External Rotation  4+/5    Left Hip Internal Rotation  4+/5    Left Hip ABduction  4+/5      Flexibility   Soft Tissue Assessment /Muscle Length  yes    Hamstrings  tight on Rt LE >Lt      Palpation   SI assessment   no obliquities noted    Palpation comment  tight and tender left QL, Lt  lumbar parapsinals, left glutes      Special Tests    Special Tests  Lumbar    Lumbar Tests  Straight Leg Raise      Straight Leg Raise   Findings  Positive    Side   Left      Transfers   Comments  sit to stand very stiff and guarded with, no trunk flexion      Ambulation/Gait   Gait Pattern  Decreased stride length   guarded               Objective measurements completed on examination: See above findings.      [facility] Adult PT Treatment/Exercise - 04/18/18 0001      Self-Care   Self-Care  Other Self-Care Comments    Other Self-Care Comments   educated on towel roll for posture; initial stretching, lateral shift             PT Education - 04/18/18 0838    Education Details   Access Code: [email or web address]     Person(s) Educated  Patient    Methods  Explanation;Demonstration;Handout    Comprehension  Verbalized understanding;Returned demonstration       PT Short Term Goals - 04/18/18 1305      PT SHORT TERM GOAL #1   Title  ind with initial HEP    Time  4    Period  Weeks    Status  New    Target Date  05/16/18        PT Long Term Goals - 04/18/18 1256      PT LONG TERM GOAL #1   Title  ind with advanced HEP    Time  8    Period  Weeks    Status  New    Target Date  06/13/18      PT LONG TERM GOAL #2   Title  FOTO < or = to 30% limited    Time  8    Period  Weeks    Status  New  Target Date  06/13/18      PT LONG TERM GOAL #3   Title  pt will be able to bend down to pick up object from the floor with good body mechanics for decreased pain    Time  8    Period  Weeks    Status  New    Target Date  06/13/18      PT LONG TERM GOAL #4   Title  pt will report 75% less pain during the work day due to improved posture and strength    Time  8    Period  Weeks    Status  New    Target Date  06/13/18      PT LONG TERM GOAL #5   Title  Pt will demonstrate full and pain free lumbar AROM for return to prior level of activity    Time  8     Period  Weeks    Status  New    Target Date  06/13/18             Plan - 04/18/18 1610    Clinical Impression Statement  Pt presents to clinic with low back pain due to low back pain.  Pt has limited lumbar flexion and pain with flexion and left SB. Pt is having pain on left that occasionally runs into left hip and down anterior thigh to the knee.  Pt has decreased lordosis with L2/3 hypomobilty and decreased extension mobility.  Pt has very tight lumbar paraspinals and QL on the left.  Pt has positive ASLR, no pain with PSLR.  Pt has weakness throughout left LE.  Pt has lateral shift to the right.  Posture and gait abnormalities are present as mentioned above.  She is guarded with sit to stand and using UE support to perform transfers.  Pt will benefit from skilled PT to address impairments and return to maximum function.    Clinical Presentation  Stable    Clinical Presentation due to:  pt is stable, has not worsened since onset    Clinical Decision Making  Low    Rehab Potential  Excellent    PT Frequency  2x / week    PT Duration  8 weeks    PT Treatment/Interventions  ADLs/Self Care Home Management;Biofeedback;Electrical Stimulation;Iontophoresis 4mg /ml Dexamethasone;Moist Heat;Traction;Therapeutic activities;Therapeutic exercise;Neuromuscular re-education;Patient/family education;Manual techniques;Dry needling;Passive range of motion;Taping    PT Next Visit Plan  manual and modalities for pain, DN discuss if approprioate, lumbar extension and lateral shift, lumbar ROM and initial core strengthening    PT Home Exercise Plan   Access Code: [email or web address]     Recommended Other Services  eval 04/18/18    Consulted and Agree with Plan of Care  Patient       Patient will benefit from skilled therapeutic intervention in order to improve the following deficits and impairments:  Abnormal gait, Pain, Increased fascial restricitons, Increased muscle spasms, Postural dysfunction, Decreased strength,  Decreased range of motion, Difficulty walking  Visit Diagnosis: Muscle spasm of back - Plan: PT plan of care cert/re-cert, CANCELED: PT plan of care cert/re-cert  Muscle weakness (generalized) - Plan: PT plan of care cert/re-cert, CANCELED: PT plan of care cert/re-cert  Abnormal posture - Plan: PT plan of care cert/re-cert, CANCELED: PT plan of care cert/re-cert     Problem List Patient Active Problem List   Diagnosis Date Noted  . Midline low back pain without sciatica 04/11/2018  . HYPERLIPIDEMIA-MIXED 09/25/2010  . TACHYCARDIA 11/19/2008  .  PALPITATIONS 11/19/2008    Barbara Carlson, PT 04/18/2018, 1:08 PM  [facility] [address] [place], [place], [address] Phone: [phone]   Fax:  [phone]  Name: Barbara Carlson MRN: [id number] Date of Birth: [birth year]

## 2018-04-23 ENCOUNTER — Encounter: Payer: Self-pay | Admitting: Physical Therapy

## 2018-04-23 ENCOUNTER — Ambulatory Visit: Payer: 59 | Admitting: Physical Therapy

## 2018-04-23 DIAGNOSIS — M6281 Muscle weakness (generalized): Secondary | ICD-10-CM | POA: Diagnosis not present

## 2018-04-23 DIAGNOSIS — R293 Abnormal posture: Secondary | ICD-10-CM

## 2018-04-23 DIAGNOSIS — M62830 Muscle spasm of back: Secondary | ICD-10-CM

## 2018-04-23 NOTE — Therapy (Signed)
[facility] [address] [place], [place], [address] Phone: [phone]   Fax:  [phone]  Physical Therapy Treatment  Patient Details  Name: Barbara Carlson MRN: [id number] Date of Birth: 03/20/1961 Referring Provider: Burnis Medin, MD   Encounter Date: 04/23/2018  PT End of Session - 04/23/18 1357    Visit Number  2    Date for PT Re-Evaluation  06/13/18    PT Start Time  1230    PT Stop Time  1335    PT Time Calculation (min)  65 min    Activity Tolerance  Patient limited by pain;Patient tolerated treatment well    Behavior During Therapy  [facility] for tasks assessed/performed       Past Medical History:  Diagnosis Date  . Asthma    as a child  . Cancer ([place])    basal cell  . Costochondritis, acute    couple years ago  . Hyperlipidemia    diet controlled  . Other dyspnea and respiratory abnormality   . Palpitations   . Post-operative nausea and vomiting   . Tachycardia, unspecified     Past Surgical History:  Procedure Laterality Date  . BASAL CELL CARCINOMA EXCISION  2004   right  abd  . GANGLION CYST EXCISION  2015   right hand  . REFRACTIVE SURGERY  2003   Bil  . TONSILLECTOMY Bilateral 1987    There were no vitals filed for this visit.  Subjective Assessment - 04/23/18 1234    Subjective  Pt reports she is in a great deal of pain.  Got worse with the exercises from last visit.  Pain continues in left lumbopelvic area and extends circumferentially around Lt hip and into Lt anterior thigh.    Limitations  Sitting;Standing;Walking    How long can you sit comfortably?  15 min    How long can you stand comfortably?  1 hour    How long can you walk comfortably?  always hurts     Patient Stated Goals  get rid of pain    Currently in Pain?  Yes    Pain Score  4    was an 8 this AM   Pain Location  Back    Pain Orientation  Left;Posterior;Anterior    Pain Descriptors / Indicators   Pressure;Sharp    Pain Type  Acute pain    Pain Radiating Towards  Left hip and anterior thigh    Pain Onset  1 to 4 weeks ago    Pain Frequency  Constant    Aggravating Factors   any position for > 15 min    Pain Relieving Factors  heat/ice                       [facility] Adult PT Treatment/Exercise - 04/23/18 0001      Exercises   Exercises  Lumbar      Lumbar Exercises: Stretches   Press Ups  5 reps;5 seconds      Lumbar Exercises: Standing   Other Standing Lumbar Exercises  Standing lumbar backward bends with hands on low back, 1x10 reps      Modalities   Modalities  Cryotherapy   Left lateral lumbopelvic x 15 min in sitting     Cryotherapy   Number Minutes Cryotherapy  15 Minutes    Cryotherapy Location  Lumbar Spine;Hip   Lt lateral lumbopelvic in sitting   Type of Cryotherapy  Ice pack      Manual Therapy   Manual Therapy  Joint mobilization;Passive ROM    Manual therapy comments  Left LE traction performed manually above knee in supine, Gr I/II    Joint Mobilization  Left lumbar facet gapping Gr II/III in Rt sidelying    Passive ROM  Left hip ER with contract/relax of hip abduct and ext rotators    Kinesiotex  Inhibit Muscle      Kinesiotix   Inhibit Muscle   4 I strips, 2 vertical along paraspinals without stretch, 2 horizontal above and below pain with 50% stretch             PT Education - 04/23/18 1411    Education Details  Access Code: [email or web address] , benefits of kinesiotape, extension program for L-spine with awareness to D/C if symptoms progress or extend further into Lt LE (look for centralization)    Person(s) Educated  Patient    Methods  Explanation;Handout    Comprehension  Verbalized understanding       PT Short Term Goals - 04/18/18 1305      PT SHORT TERM GOAL #1   Title  ind with initial HEP    Time  4    Period  Weeks    Status  New    Target Date  05/16/18        PT Long Term Goals - 04/18/18 1256      PT LONG TERM  GOAL #1   Title  ind with advanced HEP    Time  8    Period  Weeks    Status  New    Target Date  06/13/18      PT LONG TERM GOAL #2   Title  FOTO < or = to 30% limited    Time  8    Period  Weeks    Status  New    Target Date  06/13/18      PT LONG TERM GOAL #3   Title  pt will be able to bend down to pick up object from the floor with good body mechanics for decreased pain    Time  8    Period  Weeks    Status  New    Target Date  06/13/18      PT LONG TERM GOAL #4   Title  pt will report 75% less pain during the work day due to improved posture and strength    Time  8    Period  Weeks    Status  New    Target Date  06/13/18      PT LONG TERM GOAL #5   Title  Pt will demonstrate full and pain free lumbar AROM for return to prior level of activity    Time  8    Period  Weeks    Status  New    Target Date  06/13/18            Plan - 04/23/18 1400    Clinical Impression Statement  Pt reports she has been in significant pain.  Her lateral shift seems to have improved although she states her initial HEP exercises have been painful to perform.  Her sleep is disrupted by pain and she continues to be limited by pain in any prolonged position.  Her pain improved and centralized with manual therapy techniques and lumbar extension program initiated today.  Pt will continue to benefit from close monitoring of symptom  behavior by PT as well as skilled interventions to address her pain, limited functional mobility and tolerance to prolonged sitting and standing.    Rehab Potential  Excellent    PT Frequency  2x / week    PT Duration  8 weeks    PT Treatment/Interventions  ADLs/Self Care Home Management;Biofeedback;Electrical Stimulation;Iontophoresis 4mg /ml Dexamethasone;Moist Heat;Traction;Therapeutic activities;Therapeutic exercise;Neuromuscular re-education;Patient/family education;Manual techniques;Dry needling;Passive range of motion;Taping    PT Next Visit Plan  manual and  modalities for pain, DN discuss if approprioate, lumbar extension and lateral shift, lumbar ROM and initial core strengthening, review proper bending techniques    PT Home Exercise Plan   Access Code: [email or web address]     Consulted and Agree with Plan of Care  Patient       Patient will benefit from skilled therapeutic intervention in order to improve the following deficits and impairments:  Abnormal gait, Pain, Increased fascial restricitons, Increased muscle spasms, Postural dysfunction, Decreased strength, Decreased range of motion, Difficulty walking  Visit Diagnosis: Muscle spasm of back  Muscle weakness (generalized)  Abnormal posture     Problem List Patient Active Problem List   Diagnosis Date Noted  . Midline low back pain without sciatica 04/11/2018  . HYPERLIPIDEMIA-MIXED 09/25/2010  . TACHYCARDIA 11/19/2008  . PALPITATIONS 11/19/2008   Baruch Merl, PT 04/23/18 2:15 PM    Barbara Carlson 04/23/2018, 2:14 PM  [facility] [address], [place], [place], [address] Phone: [phone]   Fax:  [phone]  Name: Barbara Carlson MRN: [id number] Date of Birth: May 29, 1961

## 2018-04-23 NOTE — Patient Instructions (Signed)
Access Code: [email or web address]  URL: [email or web address]  Date: 04/23/2018  Prepared by: Venetia Night Braiden Presutti   Exercises  Prone Press Up on Elbows - 10 reps - 3 sets - 5 sec hold - 1x daily - 7x weekly  Standing Lumbar Extension - 10 reps - 3 sets - 1 sec hold - 1x daily - 7x weekly  Patient Education  Office Posture

## 2018-04-28 ENCOUNTER — Encounter: Payer: Self-pay | Admitting: Physical Therapy

## 2018-04-28 ENCOUNTER — Ambulatory Visit: Payer: 59 | Admitting: Physical Therapy

## 2018-04-28 DIAGNOSIS — M6281 Muscle weakness (generalized): Secondary | ICD-10-CM | POA: Diagnosis not present

## 2018-04-28 DIAGNOSIS — R293 Abnormal posture: Secondary | ICD-10-CM

## 2018-04-28 DIAGNOSIS — M62830 Muscle spasm of back: Secondary | ICD-10-CM

## 2018-04-28 NOTE — Therapy (Signed)
[facility] [address], [place], [place], [address] Phone: [phone]   Fax:  [phone]  Physical Therapy Treatment  Patient Details  Name: Barbara Carlson MRN: [id number] Date of Birth: 22-Dec-1960 Referring Provider: Burnis Medin, MD   Encounter Date: 04/28/2018  PT End of Session - 04/28/18 1023    Visit Number  3    Date for PT Re-Evaluation  06/13/18    PT Start Time  0801    PT Stop Time  0845    PT Time Calculation (min)  44 min    Activity Tolerance  Patient tolerated treatment well    Behavior During Therapy  [facility] for tasks assessed/performed       Past Medical History:  Diagnosis Date  . Asthma    as a child  . Cancer ([place])    basal cell  . Costochondritis, acute    couple years ago  . Hyperlipidemia    diet controlled  . Other dyspnea and respiratory abnormality   . Palpitations   . Post-operative nausea and vomiting   . Tachycardia, unspecified     Past Surgical History:  Procedure Laterality Date  . BASAL CELL CARCINOMA EXCISION  2004   right  abd  . GANGLION CYST EXCISION  2015   right hand  . REFRACTIVE SURGERY  2003   Bil  . TONSILLECTOMY Bilateral 1987    There were no vitals filed for this visit.  Subjective Assessment - 04/28/18 0759    Subjective  I really liked the tape and think it helped.  I finally made it through the night in my bed the night of last treatment due to improved pain.  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  I had a great weekend for pain over the weekend - pain was improved by 30-35%.  I rested and slept in my bed all night Saturday night.  Last night however, after a day of standing activities again, I had a bad night and had muscle spasms in Lt buttock and didn't sleep well.  Had to move to the couch and use ice.     Limitations  Sitting;Standing;Walking    How long can you sit  comfortably?  15 min    How long can you stand comfortably?  1 hour    How long can you walk comfortably?  always hurts     Patient Stated Goals  get rid of pain    Currently in Pain?  Yes    Pain Score  8     Pain Location  Buttocks    Pain Orientation  Left;Posterior    Pain Descriptors / Indicators  Burning;Pressure    Pain Type  Acute pain    Pain Radiating Towards  Left anterior thigh    Pain Onset  1 to 4 weeks ago    Pain Frequency  Constant    Aggravating Factors   any position > 15 min    Pain Relieving Factors  ice    Effect of Pain on Daily Activities  pain with all activities    Multiple Pain Sites  No                       [facility] Adult PT Treatment/Exercise - 04/28/18 0001      Exercises   Exercises  Lumbar;Knee/Hip  Lumbar Exercises: Stretches   Single Knee to Chest Stretch  Left   1x10   Piriformis Stretch  Left;3 reps;30 seconds    Figure 4 Stretch  3 reps;30 seconds   Left     Manual Therapy   Manual Therapy  Soft tissue mobilization    Manual therapy comments  Lt glute min, med, max and Lt piriformis    Kinesiotex  Inhibit Muscle      Kinesiotix   Inhibit Muscle   4 I strips, 2 vertical along paraspinals without stretch, 2 horizontal above and below pain with 50% stretch       Trigger Point Dry Needling - 04/28/18 0847    Consent Given?  Yes    Education Handout Provided  Yes    Muscles Treated Lower Body  Gluteus minimus;Gluteus maximus;Piriformis   Left   Gluteus Maximus Response  Twitch response elicited;Palpable increased muscle length    Gluteus Minimus Response  Twitch response elicited;Palpable increased muscle length    Piriformis Response  Twitch response elicited;Palpable increased muscle length           PT Education - 04/28/18 1020    Education Details  Access Code: [email or web address], dry needling, Lt hip stretches     Person(s) Educated  Patient    Methods  Explanation;Demonstration;Handout    Comprehension   Verbalized understanding;Returned demonstration       PT Short Term Goals - 04/18/18 1305      PT SHORT TERM GOAL #1   Title  ind with initial HEP    Time  4    Period  Weeks    Status  New    Target Date  05/16/18        PT Long Term Goals - 04/18/18 1256      PT LONG TERM GOAL #1   Title  ind with advanced HEP    Time  8    Period  Weeks    Status  New    Target Date  06/13/18      PT LONG TERM GOAL #2   Title  FOTO < or = to 30% limited    Time  8    Period  Weeks    Status  New    Target Date  06/13/18      PT LONG TERM GOAL #3   Title  pt will be able to bend down to pick up object from the floor with good body mechanics for decreased pain    Time  8    Period  Weeks    Status  New    Target Date  06/13/18      PT LONG TERM GOAL #4   Title  pt will report 75% less pain during the work day due to improved posture and strength    Time  8    Period  Weeks    Status  New    Target Date  06/13/18      PT LONG TERM GOAL #5   Title  Pt will demonstrate full and pain free lumbar AROM for return to prior level of activity    Time  8    Period  Weeks    Status  New    Target Date  06/13/18            Plan - 04/28/18 1024    Clinical Impression Statement  Pt reported some good days and increased tolerance to activity following last appointment.  She felt  the lumbar taping and manual therapy gave her some good relief.  She was able to sleep in her bed all night without moving to couch two of the night since her last visit.  She continues to be limited in tolerance to prolonged standing, walking and sitting.  She had another bad night last night and reported 8/10 pain today with a feeling of "explosion" pressure in left buttock and hip.  Her pain was reproduced with palpation to Lt gluteals and piriformis.  Pt tolerated dry needling to these muscles well and reported a feeling of a "lighter leg" with less pressure following the technique.  Muscles had improved  tone and flexibility and Pt was able to tolerate Lt hip stretching for the first time today following needling.  Pt will continue to benefit from skilled PT to include dry needling, manual therapy and incorporation of appropriate exercises to address her deficits.    Clinical Presentation  Stable    Rehab Potential  Excellent    PT Frequency  2x / week    PT Duration  8 weeks    PT Treatment/Interventions  ADLs/Self Care Home Management;Biofeedback;Electrical Stimulation;Iontophoresis 4mg /ml Dexamethasone;Moist Heat;Traction;Therapeutic activities;Therapeutic exercise;Neuromuscular re-education;Patient/family education;Manual techniques;Dry needling;Passive range of motion;Taping    PT Next Visit Plan  manual and modalities for pain, follow up on DN and taping, lumbar ROM and initial core strengthening, review proper bending techniques    PT Home Exercise Plan   Access Code: [email or web address]     Consulted and Agree with Plan of Care  Patient       Patient will benefit from skilled therapeutic intervention in order to improve the following deficits and impairments:  Abnormal gait, Pain, Increased fascial restricitons, Increased muscle spasms, Postural dysfunction, Decreased strength, Decreased range of motion, Difficulty walking  Visit Diagnosis: Muscle spasm of back  Muscle weakness (generalized)  Abnormal posture     Problem List Patient Active Problem List   Diagnosis Date Noted  . Midline low back pain without sciatica 04/11/2018  . HYPERLIPIDEMIA-MIXED 09/25/2010  . TACHYCARDIA 11/19/2008  . PALPITATIONS 11/19/2008    Baruch Merl, PT 04/28/18 10:41 AM   [facility] [address], [place], [place], [address] Phone: [phone]   Fax:  [phone]  Name: Barbara Carlson MRN: [id number] Date of Birth: 12/17/1960

## 2018-04-28 NOTE — Patient Instructions (Signed)
Access Code: [email or web address]  URL: [email or web address]  Date: 04/28/2018  Prepared by: Venetia Night Beuhring   Exercises  Supine Single Knee to Chest Stretch - 5 reps - 1 sets - 10 sec hold - 2x daily - 7x weekly  Left Standing Lateral Shift Correction at [place] - 10 reps - 1 sets - 2x daily - 7x weekly  Prone Press Up on Elbows - 10 reps - 3 sets - 5 sec hold - 1x daily - 7x weekly  Standing Lumbar Extension - 10 reps - 3 sets - 1 sec hold - 1x daily - 7x weekly  Supine Figure 4 Piriformis Stretch - 3 reps - 1 sets - 30 hold - 1x daily - 7x weekly  Supine Piriformis Stretch with Leg Straight - 3 reps - 1 sets - 30 hold - 3x daily - 7x weekly  Patient Education  Office Posture  Trigger Point Dry Needling

## 2018-04-30 ENCOUNTER — Encounter: Payer: Self-pay | Admitting: Physical Therapy

## 2018-04-30 ENCOUNTER — Encounter: Payer: Self-pay | Admitting: Family Medicine

## 2018-04-30 ENCOUNTER — Ambulatory Visit: Payer: 59 | Admitting: Physical Therapy

## 2018-04-30 DIAGNOSIS — M6281 Muscle weakness (generalized): Secondary | ICD-10-CM

## 2018-04-30 DIAGNOSIS — R293 Abnormal posture: Secondary | ICD-10-CM | POA: Diagnosis not present

## 2018-04-30 DIAGNOSIS — M62830 Muscle spasm of back: Secondary | ICD-10-CM

## 2018-04-30 NOTE — Therapy (Signed)
[facility] [address] [place], [place], [address] Phone: [phone]   Fax:  [phone]  Physical Therapy Treatment  Patient Details  Name: Barbara Carlson MRN: [id number] Date of Birth: 04-28-1961 Referring Provider: Burnis Medin, MD   Encounter Date: 04/30/2018  PT End of Session - 04/30/18 1606    Visit Number  4    Date for PT Re-Evaluation  06/13/18    PT Start Time  1448    PT Stop Time  6962    PT Time Calculation (min)  60 min    Activity Tolerance  Patient tolerated treatment well    Behavior During Therapy  [facility] for tasks assessed/performed       Past Medical History:  Diagnosis Date  . Asthma    as a child  . Cancer ([place])    basal cell  . Costochondritis, acute    couple years ago  . Hyperlipidemia    diet controlled  . Other dyspnea and respiratory abnormality   . Palpitations   . Post-operative nausea and vomiting   . Tachycardia, unspecified     Past Surgical History:  Procedure Laterality Date  . BASAL CELL CARCINOMA EXCISION  2004   right  abd  . GANGLION CYST EXCISION  2015   right hand  . REFRACTIVE SURGERY  2003   Bil  . TONSILLECTOMY Bilateral 1987    There were no vitals filed for this visit.  Subjective Assessment - 04/30/18 1536    Subjective  Pt reported she did really well with the dry needling and had a really good day yesterday until 4:30am this morning when pain awakened her.  She describes dagger-like pain in left gluteals rated 8/10.  She says her pain was improved when she got up and started moving around.  It is better this afternoon.    Limitations  Sitting;Standing;Walking    How long can you sit comfortably?  15 min    How long can you stand comfortably?  1 hour    How long can you walk comfortably?  always hurts     Patient Stated Goals  get rid of pain    Currently in Pain?  Yes    Pain Score  2     Pain Location  Hip    Pain Orientation   Left;Posterior    Pain Descriptors / Indicators  Pressure    Pain Type  Acute pain    Pain Onset  1 to 4 weeks ago    Aggravating Factors   morning on waking, prolonged walking/standing/sitting    Pain Relieving Factors  getting up and moving around    Effect of Pain on Daily Activities  sleeping through the night    Multiple Pain Sites  No         [facility] PT Assessment - 04/30/18 0001      Flexibility   Soft Tissue Assessment /Muscle Length  yes   Lt glute min, med, TFL     Palpation   SI assessment   left SI joint restricted                   [facility] Adult PT Treatment/Exercise - 04/30/18 0001      Exercises   Exercises  Lumbar      Lumbar Exercises: Quadruped   Straight Leg Raise  5 seconds;5 reps    Straight Leg Raises Limitations  Lt hip extension   PT cued transversus abdominus/avoidance  of pelvic rotation     Knee/Hip Exercises: Standing   Hip Extension  Stengthening;Left;10 reps;3 sets;Knee straight   with countertop support     Modalities   Modalities  --      Cryotherapy   Number Minutes Cryotherapy  10 Minutes    Cryotherapy Location  Lumbar Spine;Hip   Lt lateral lumbopelvic in sitting   Type of Cryotherapy  Ice pack      Manual Therapy   Manual Therapy  --    Manual therapy comments  Lt glute min, med, TFL    Passive ROM  Left hip ER/IR/flexion/circumduction    Muscle Energy Technique  to promote left ilial anterior rotation, pubic shotgun technique       Trigger Point Dry Needling - 04/30/18 1542    Consent Given?  Yes    Muscles Treated Lower Body  Tensor fascia lata;Gluteus minimus   Gluteus medius   Gluteus Maximus Response  Twitch response elicited;Palpable increased muscle length   gluteus medius   Gluteus Minimus Response  Twitch response elicited;Palpable increased muscle length    Tensor Fascia Lata Response  Twitch response elicited;Palpable increased muscle length           PT Education - 04/30/18 1541    Education  Details  sleeping positions    Person(s) Educated  Patient    Methods  Explanation;Handout    Comprehension  Verbalized understanding;Returned demonstration       PT Short Term Goals - 04/18/18 1305      PT SHORT TERM GOAL #1   Title  ind with initial HEP    Time  4    Period  Weeks    Status  New    Target Date  05/16/18        PT Long Term Goals - 04/18/18 1256      PT LONG TERM GOAL #1   Title  ind with advanced HEP    Time  8    Period  Weeks    Status  New    Target Date  06/13/18      PT LONG TERM GOAL #2   Title  FOTO < or = to 30% limited    Time  8    Period  Weeks    Status  New    Target Date  06/13/18      PT LONG TERM GOAL #3   Title  pt will be able to bend down to pick up object from the floor with good body mechanics for decreased pain    Time  8    Period  Weeks    Status  New    Target Date  06/13/18      PT LONG TERM GOAL #4   Title  pt will report 75% less pain during the work day due to improved posture and strength    Time  8    Period  Weeks    Status  New    Target Date  06/13/18      PT LONG TERM GOAL #5   Title  Pt will demonstrate full and pain free lumbar AROM for return to prior level of activity    Time  8    Period  Weeks    Status  New    Target Date  06/13/18            Plan - 04/30/18 1553    Clinical Impression Statement  Pt continues to have increased  muscle tone and pain in Lt hip abductors which seem to improve for 1-2 days after treatment.  Pt reported 8/10 pain early this AM which improved with activity/movement, suggesting a shift in pain pattern towards being ready to begin lumbopelvic stabiliation program.  She will continue to benefit from skilled PT to include dry needling, manual therapy and beginning of lumbopelvic stabilization program.    Rehab Potential  Excellent    PT Frequency  2x / week    PT Duration  8 weeks    PT Treatment/Interventions  ADLs/Self Care Home Management;Biofeedback;Electrical  Stimulation;Iontophoresis 4mg /ml Dexamethasone;Moist Heat;Traction;Therapeutic activities;Therapeutic exercise;Neuromuscular re-education;Patient/family education;Manual techniques;Dry needling;Passive range of motion;Taping    PT Next Visit Plan  manual and modalities for pain, follow up on DN and taping, lumbar ROM and initial core strengthening, review proper bending techniques    PT Home Exercise Plan   Access Code: [email or web address]     Consulted and Agree with Plan of Care  Patient       Patient will benefit from skilled therapeutic intervention in order to improve the following deficits and impairments:  Abnormal gait, Pain, Increased fascial restricitons, Increased muscle spasms, Postural dysfunction, Decreased strength, Decreased range of motion, Difficulty walking  Visit Diagnosis: Muscle spasm of back  Muscle weakness (generalized)  Abnormal posture     Problem List Patient Active Problem List   Diagnosis Date Noted  . Midline low back pain without sciatica 04/11/2018  . HYPERLIPIDEMIA-MIXED 09/25/2010  . TACHYCARDIA 11/19/2008  . PALPITATIONS 11/19/2008   Baruch Merl, PT 04/30/18 4:09 PM     [facility] [address], [place], [place], [address] Phone: [phone]   Fax:  [phone]  Name: Barbara Carlson MRN: [id number] Date of Birth: 10-23-1960

## 2018-05-01 ENCOUNTER — Encounter: Payer: Self-pay | Admitting: Family Medicine

## 2018-05-02 MED ORDER — CYCLOBENZAPRINE HCL 5 MG PO TABS: 5.0000 mg | ORAL_TABLET | Freq: Three times a day (TID) | ORAL | 0 refills | Status: DC | PRN

## 2018-05-02 MED FILL — CYCLOBENZAPRINE 5 MG TABLET: 5 | 10 days supply | Qty: 30 | Fill #0

## 2018-05-05 ENCOUNTER — Ambulatory Visit: Payer: 59 | Admitting: Physical Therapy

## 2018-05-05 ENCOUNTER — Encounter: Payer: Self-pay | Admitting: Physical Therapy

## 2018-05-05 DIAGNOSIS — M62830 Muscle spasm of back: Secondary | ICD-10-CM

## 2018-05-05 DIAGNOSIS — R293 Abnormal posture: Secondary | ICD-10-CM

## 2018-05-05 DIAGNOSIS — M6281 Muscle weakness (generalized): Secondary | ICD-10-CM

## 2018-05-05 NOTE — Patient Instructions (Signed)
Access Code: [email or web address]  URL: [email or web address]  Date: 05/05/2018  Prepared by: Venetia Night Beuhring   Exercises  Supine Single Knee to Chest Stretch - 5 reps - 1 sets - 10 sec hold - 2x daily - 7x weekly  Left Standing Lateral Shift Correction at [place] - 10 reps - 1 sets - 2x daily - 7x weekly  Prone Press Up on Elbows - 10 reps - 3 sets - 5 sec hold - 1x daily - 7x weekly  Standing Lumbar Extension - 10 reps - 3 sets - 1 sec hold - 1x daily - 7x weekly  Supine Figure 4 Piriformis Stretch - 3 reps - 1 sets - 30 hold - 1x daily - 7x weekly  Supine Piriformis Stretch with Leg Straight - 3 reps - 1 sets - 30 hold - 3x daily - 7x weekly  Standing Hip Extension - 10 reps - 3 sets - 1x daily - 7x weekly  Supine Transversus Abdominis Bracing with Heel Slide - 10 reps - 3 sets - 1x daily - 7x weekly  Supine March - 10 reps - 3 sets - 1x daily - 7x weekly  Supine Transversus Abdominis Bracing with Double Leg Fallout - 10 reps - 3 sets - 1x daily - 7x weekly  Standing Hip Hinge with Dowel - 10 reps - 3 sets - 1x daily - 7x weekly  Patient Education  Office Posture  Trigger Point Dry Needling

## 2018-05-05 NOTE — Therapy (Signed)
[facility] [address] [place], [place], [address] Phone: [phone]   Fax:  [phone]  Physical Therapy Treatment  Patient Details  Name: Barbara Carlson MRN: [id number] Date of Birth: [birth year] Referring Provider: Burnis Medin, MD   Encounter Date: 05/05/2018  PT End of Session - 05/05/18 0848    Visit Number  5    Date for PT Re-Evaluation  06/13/18    PT Start Time  0800    PT Stop Time  0845    PT Time Calculation (min)  45 min    Activity Tolerance  Patient tolerated treatment well;No increased pain   Pt reported slight release of pain with exercise today   Behavior During Therapy  [facility] for tasks assessed/performed       Past Medical History:  Diagnosis Date  . Asthma    as a child  . Cancer ([place])    basal cell  . Costochondritis, acute    couple years ago  . Hyperlipidemia    diet controlled  . Other dyspnea and respiratory abnormality   . Palpitations   . Post-operative nausea and vomiting   . Tachycardia, unspecified     Past Surgical History:  Procedure Laterality Date  . BASAL CELL CARCINOMA EXCISION  2004   right  abd  . GANGLION CYST EXCISION  2015   right hand  . REFRACTIVE SURGERY  2003   Bil  . TONSILLECTOMY Bilateral 1987    There were no vitals filed for this visit.  Subjective Assessment - 05/05/18 0759    Subjective  Pt states her Lt hip always hurts when she wakes up but she has been able to start sleeping on Lt side and hasn't had to move to couch during night and use ice.  Sleeping better.  Pt feels that pain has shifted from the exploding pressure to nagging ache.    Limitations  Sitting;Standing;Walking    How long can you sit comfortably?  45 min    How long can you stand comfortably?  60 min    How long can you walk comfortably?  walking helps the pain    Patient Stated Goals  get rid of pain    Currently in Pain?  Yes    Pain Score  4     Pain Location   Back    Pain Orientation  Left    Pain Descriptors / Indicators  Aching;Nagging    Pain Type  Acute pain    Pain Onset  More than a month ago    Pain Frequency  Intermittent    Multiple Pain Sites  No                       [facility] Adult PT Treatment/Exercise - 05/05/18 0001      Exercises   Exercises  Lumbar      Lumbar Exercises: Aerobic   Recumbent Bike  level 1 x7 min      Lumbar Exercises: Standing   Other Standing Lumbar Exercises  Functional squats with dowel, 15 reps   PT cued Pt to use hips/maintain core, dowel on spine   Other Standing Lumbar Exercises  standing posture core cueing with yellow tband shoulder extensions x 20 reps, 3 sec hold   PT cued Pt to release overuse of buttock muscles/use core     Lumbar Exercises: Supine   Pelvic Tilt  5 reps   to establish neutral pelvic  position   Clam  20 reps    Clam Limitations  PT provided core cueing for neutral pelvis    Heel Slides  20 reps    Heel Slides Limitations  PT cued to avoid pelvic rotation    Bent Knee Raise  20 reps    Bent Knee Raise Limitations  PT cued to avoid loss of neutral pelvis    Bridge with clamshell  10 reps    Bridge with [employer] Limitations  PT d/c'd due to increased Lt hip pain    Other Supine Lumbar Exercises  hip abduct with red tband around knees x 20 reps      Lumbar Exercises: Quadruped   Other Quadruped Lumbar Exercises  Bird dogs, arms only, legs only, then combined x 10 each   PT provided verbal/tactile cues to avoid pelvic rotation            PT Education - 05/05/18 0846    Education Details  Pt required mod-max tactile and verbal cues for maintenance of neutral pelvis during introductory core stabilization, standing posture, squatting technique    Person(s) Educated  Patient    Methods  Explanation;Demonstration;Tactile cues;Verbal cues    Comprehension  Verbalized understanding;Returned demonstration;Verbal cues required;Tactile cues required        PT Short Term Goals - 05/05/18 0854      PT SHORT TERM GOAL #1   Title  ind with initial HEP    Time  4    Period  Weeks    Status  Achieved        PT Long Term Goals - 05/05/18 0814      PT LONG TERM GOAL #3   Title  pt will be able to bend down to pick up object from the floor with good body mechanics for decreased pain    Time  8    Period  Weeks    Status  Achieved      PT LONG TERM GOAL #4   Title  pt will report 75% less pain during the work day due to improved posture and strength   pt reports increased tolerance of sitting to 45 min from previous 15 min tolerance   Time  8    Period  Weeks    Status  On-going            Plan - 05/05/18 0848    Clinical Impression Statement  Pt's pain pattern has shifted from "explosion pressure" which previously limited prolonged standing, walking and sitting.  She now reports pain as more of an aching nag, a feeling of weakness, and pain that improves with activity/motion.  Pain is no longer limited her sleep although she wakes up with pain in Lt lower back vs previous pain location of Lt hip.  She displays poor standing postural awareness and required mod-max cueing for pelvic control and maintenance of core muscle recruitment in multiple positions of core stabilization program today (supine, quadruped, standing, squatting).  Pt tolerated entire session of ther ex today without increase and even report of slight decrease in pain today.  Pt will continue to benefit from skilled progression and ongoing PT manual/tactile feedback with pelvic and core stabilization to improve strength, posture, pain and functional activities.    Clinical Presentation  Stable    Rehab Potential  Excellent    PT Frequency  2x / week    PT Duration  8 weeks    PT Treatment/Interventions  ADLs/Self Care Home Management;Biofeedback;Electrical Stimulation;Iontophoresis  4mg /ml Dexamethasone;Moist Heat;Traction;Therapeutic activities;Therapeutic  exercise;Neuromuscular re-education;Patient/family education;Manual techniques;Dry needling;Passive range of motion;Taping    PT Next Visit Plan  DN as needed on Lt lumbar and hip, continue core stabilization with PT cueing for proper core/pelvic stability    PT Home Exercise Plan   Access Code: [email or web address] added supine hooklying core, standing functional squats, postural awareness with yellow tband pullbacks in standing    Consulted and Agree with Plan of Care  Patient       Patient will benefit from skilled therapeutic intervention in order to improve the following deficits and impairments:  Abnormal gait, Pain, Increased fascial restricitons, Increased muscle spasms, Postural dysfunction, Decreased strength, Decreased range of motion, Difficulty walking  Visit Diagnosis: Muscle weakness (generalized)  Muscle spasm of back  Abnormal posture     Problem List Patient Active Problem List   Diagnosis Date Noted  . Midline low back pain without sciatica 04/11/2018  . HYPERLIPIDEMIA-MIXED 09/25/2010  . TACHYCARDIA 11/19/2008  . PALPITATIONS 11/19/2008   Baruch Merl, PT 05/05/18 8:57 AM    [facility] [address] [place], [place], [address] Phone: [phone]   Fax:  [phone]  Name: MALIAH PYLES MRN: [id number] Date of Birth: August 16, 1960

## 2018-05-07 ENCOUNTER — Encounter: Payer: Self-pay | Admitting: Physical Therapy

## 2018-05-07 ENCOUNTER — Ambulatory Visit: Payer: 59 | Admitting: Physical Therapy

## 2018-05-07 DIAGNOSIS — L570 Actinic keratosis: Secondary | ICD-10-CM | POA: Diagnosis not present

## 2018-05-07 DIAGNOSIS — D485 Neoplasm of uncertain behavior of skin: Secondary | ICD-10-CM | POA: Diagnosis not present

## 2018-05-07 DIAGNOSIS — M6281 Muscle weakness (generalized): Secondary | ICD-10-CM

## 2018-05-07 DIAGNOSIS — D2261 Melanocytic nevi of right upper limb, including shoulder: Secondary | ICD-10-CM | POA: Diagnosis not present

## 2018-05-07 DIAGNOSIS — R293 Abnormal posture: Secondary | ICD-10-CM

## 2018-05-07 DIAGNOSIS — Z86018 Personal history of other benign neoplasm: Secondary | ICD-10-CM | POA: Diagnosis not present

## 2018-05-07 DIAGNOSIS — D225 Melanocytic nevi of trunk: Secondary | ICD-10-CM | POA: Diagnosis not present

## 2018-05-07 DIAGNOSIS — M62830 Muscle spasm of back: Secondary | ICD-10-CM

## 2018-05-07 DIAGNOSIS — Z85828 Personal history of other malignant neoplasm of skin: Secondary | ICD-10-CM | POA: Diagnosis not present

## 2018-05-07 DIAGNOSIS — L738 Other specified follicular disorders: Secondary | ICD-10-CM | POA: Diagnosis not present

## 2018-05-07 NOTE — Patient Instructions (Signed)
Access Code: [email or web address]  URL: [email or web address]  Date: 05/07/2018  Prepared by: Lovett Calender   Exercises  Supine Single Knee to Chest Stretch - 5 reps - 1 sets - 10 sec hold - 2x daily - 7x weekly  Left Standing Lateral Shift Correction at [place] - 10 reps - 1 sets - 2x daily - 7x weekly  Prone Press Up on Elbows - 10 reps - 3 sets - 5 sec hold - 1x daily - 7x weekly  Standing Lumbar Extension - 10 reps - 3 sets - 1 sec hold - 1x daily - 7x weekly  Supine Figure 4 Piriformis Stretch - 3 reps - 1 sets - 30 hold - 1x daily - 7x weekly  Supine Piriformis Stretch with Leg Straight - 3 reps - 1 sets - 30 hold - 3x daily - 7x weekly  Standing Hip Extension - 10 reps - 3 sets - 1x daily - 7x weekly  Supine Transversus Abdominis Bracing with Heel Slide - 10 reps - 3 sets - 1x daily - 7x weekly  Supine March - 10 reps - 3 sets - 1x daily - 7x weekly  Supine Transversus Abdominis Bracing with Double Leg Fallout - 10 reps - 3 sets - 1x daily - 7x weekly  Standing Hip Hinge with Dowel - 10 reps - 3 sets - 1x daily - 7x weekly  Hip Flexor Stretch at Edge of Bed - 3 reps - 1 sets - 30 sec hold - 1x daily - 7x weekly  Hip Flexor Stretch on Step - 3 reps - 1 sets - 30 sec hold - 1x daily - 7x weekly  Patient Education  Office Posture  Trigger Point Dry Needling

## 2018-05-07 NOTE — Therapy (Signed)
[facility] [address], [place], [place], [address] Phone: [phone]   Fax:  [phone]  Physical Therapy Treatment  Patient Details  Name: TIEA MANNINEN MRN: [id number] Date of Birth: Jul 12, 1961 Referring Provider: Burnis Medin, MD   Encounter Date: 05/07/2018  PT End of Session - 05/07/18 0843    Visit Number  6    Date for PT Re-Evaluation  06/13/18    PT Start Time  7253    PT Stop Time  0924    PT Time Calculation (min)  42 min    Activity Tolerance  Patient tolerated treatment well;No increased pain    Behavior During Therapy  [facility] for tasks assessed/performed       Past Medical History:  Diagnosis Date  . Asthma    as a child  . Cancer ([place])    basal cell  . Costochondritis, acute    couple years ago  . Hyperlipidemia    diet controlled  . Other dyspnea and respiratory abnormality   . Palpitations   . Post-operative nausea and vomiting   . Tachycardia, unspecified     Past Surgical History:  Procedure Laterality Date  . BASAL CELL CARCINOMA EXCISION  2004   right  abd  . GANGLION CYST EXCISION  2015   right hand  . REFRACTIVE SURGERY  2003   Bil  . TONSILLECTOMY Bilateral 1987    There were no vitals filed for this visit.  Subjective Assessment - 05/07/18 0844    Subjective  I slept the last few nights without getting an icepack, but last night was more sore and had to get up at about 5am.  Pt reports she did exercises later and maybe was more tired out when doing them.      Limitations  Sitting;Standing;Walking    How long can you sit comfortably?  45 min    How long can you stand comfortably?  60 min    How long can you walk comfortably?  walking helps the pain    Patient Stated Goals  get rid of pain    Currently in Pain?  Yes    Pain Score  5     Pain Location  Back    Pain Orientation  Left    Pain Descriptors / Indicators  Aching    Pain Type  Acute pain    Pain  Radiating Towards  left anterior thigh    Pain Onset  More than a month ago    Pain Frequency  Intermittent    Aggravating Factors   morning    Pain Relieving Factors  moving    Effect of Pain on Daily Activities  sleeping    Multiple Pain Sites  No                       [facility] Adult PT Treatment/Exercise - 05/07/18 0001      Exercises   Exercises  Lumbar      Lumbar Exercises: Stretches   Hip Flexor Stretch  Left;3 reps;30 seconds   supine and standing at step as seen in HEP   Quad Stretch  Left;3 reps;30 seconds    Piriformis Stretch  Left;3 reps;30 seconds      Lumbar Exercises: Aerobic   Recumbent Bike  level 1 x8 min   PT presents     Lumbar Exercises: Supine   Clam  20 reps   red band single leg  Bridge with clamshell  10 reps   red band mini bridge     Lumbar Exercises: Quadruped   Opposite Arm/Leg Raise  Right arm/Left leg;Left arm/Right leg;10 reps;3 seconds   cues to control limb movements     Manual Therapy   Manual Therapy  Soft tissue mobilization    Soft tissue mobilization  quads, hip flexor, glutes, TFL       Trigger Point Dry Needling - 05/07/18 0850    Consent Given?  Yes    Muscles Treated Lower Body  Gluteus minimus;Gluteus maximus;Piriformis;Tensor fascia lata    Gluteus Maximus Response  Twitch response elicited;Palpable increased muscle length    Gluteus Minimus Response  Twitch response elicited;Palpable increased muscle length    Piriformis Response  Twitch response elicited;Palpable increased muscle length    Tensor Fascia Lata Response  Twitch response elicited;Palpable increased muscle length           PT Education - 05/07/18 1146    Education Details   Access Code: [email or web address]     Person(s) Educated  Patient    Methods  Explanation;Demonstration;Handout;Verbal cues    Comprehension  Verbalized understanding;Returned demonstration       PT Short Term Goals - 05/05/18 0854      PT SHORT TERM GOAL #1   Title  ind  with initial HEP    Time  4    Period  Weeks    Status  Achieved        PT Long Term Goals - 05/05/18 3474      PT LONG TERM GOAL #3   Title  pt will be able to bend down to pick up object from the floor with good body mechanics for decreased pain    Time  8    Period  Weeks    Status  Achieved      PT LONG TERM GOAL #4   Title  pt will report 75% less pain during the work day due to improved posture and strength   pt reports increased tolerance of sitting to 45 min from previous 15 min tolerance   Time  8    Period  Weeks    Status  On-going            Plan - 05/07/18 1137    Clinical Impression Statement  Pt did well with pelvic stability and was able to perform mini bridges without increased muscle tension.  Pt has palpable trigger points and tenderness in left lateral quad and hip flexor with referred pain into low back with hip flexor palpation.  STM helped to improve soft tissue length of muscles treated.  Pt was able to correctly perform additional stretches added to HEP.  She will benefit from skilled PT to continue improving soft tissue length and improve core stability.      PT Treatment/Interventions  ADLs/Self Care Home Management;Biofeedback;Electrical Stimulation;Iontophoresis 4mg /ml Dexamethasone;Moist Heat;Traction;Therapeutic activities;Therapeutic exercise;Neuromuscular re-education;Patient/family education;Manual techniques;Dry needling;Passive range of motion;Taping    PT Next Visit Plan  possibly DN to quads and hip flexor left; f/u and DN as needed on Lt lumbar and hip, continue core stabilization with PT cueing for proper core/pelvic stability    PT Home Exercise Plan   Access Code: [email or web address] added supine hooklying core, standing functional squats, postural awareness with yellow tband pullbacks in standing    Recommended Other Services  eval 12/16/41 cert signed    Consulted and Agree with Plan of Care  Patient       Patient  will benefit from skilled  therapeutic intervention in order to improve the following deficits and impairments:  Abnormal gait, Pain, Increased fascial restricitons, Increased muscle spasms, Postural dysfunction, Decreased strength, Decreased range of motion, Difficulty walking  Visit Diagnosis: Muscle spasm of back  Abnormal posture  Muscle weakness (generalized)     Problem List Patient Active Problem List   Diagnosis Date Noted  . Midline low back pain without sciatica 04/11/2018  . HYPERLIPIDEMIA-MIXED 09/25/2010  . TACHYCARDIA 11/19/2008  . PALPITATIONS 11/19/2008    Zannie Cove, PT 05/07/2018, 11:47 AM  [facility] [address], [place], [place], [address] Phone: [phone]   Fax:  [phone]  Name: LAUREE YURICK MRN: [id number] Date of Birth: April 09, 1961

## 2018-05-12 ENCOUNTER — Encounter: Payer: Self-pay | Admitting: Physical Therapy

## 2018-05-12 ENCOUNTER — Ambulatory Visit: Payer: 59 | Admitting: Physical Therapy

## 2018-05-12 DIAGNOSIS — M6281 Muscle weakness (generalized): Secondary | ICD-10-CM | POA: Diagnosis not present

## 2018-05-12 DIAGNOSIS — R293 Abnormal posture: Secondary | ICD-10-CM | POA: Diagnosis not present

## 2018-05-12 DIAGNOSIS — M62830 Muscle spasm of back: Secondary | ICD-10-CM

## 2018-05-12 NOTE — Therapy (Signed)
[facility] [address] [place], [place], [address] Phone: [phone]   Fax:  [phone]  Physical Therapy Treatment  Patient Details  Name: Barbara Carlson MRN: [id number] Date of Birth: 01/25/1961 Referring Provider (PT): Panosh, Standley Brooking, MD   Encounter Date: 05/12/2018  PT End of Session - 05/12/18 1005    Visit Number  7    Date for PT Re-Evaluation  06/13/18    PT Start Time  0932    PT Stop Time  9379    PT Time Calculation (min)  43 min    Activity Tolerance  Patient tolerated treatment well;No increased pain       Past Medical History:  Diagnosis Date  . Asthma    as a child  . Cancer ([place])    basal cell  . Costochondritis, acute    couple years ago  . Hyperlipidemia    diet controlled  . Other dyspnea and respiratory abnormality   . Palpitations   . Post-operative nausea and vomiting   . Tachycardia, unspecified     Past Surgical History:  Procedure Laterality Date  . BASAL CELL CARCINOMA EXCISION  2004   right  abd  . GANGLION CYST EXCISION  2015   right hand  . REFRACTIVE SURGERY  2003   Bil  . TONSILLECTOMY Bilateral 1987    There were no vitals filed for this visit.  Subjective Assessment - 05/12/18 0936    Subjective  I really feel the hip flexor stretch.      Limitations  Sitting;Standing;Walking    Currently in Pain?  Yes    Pain Score  2     Pain Location  Back    Pain Orientation  Left    Pain Descriptors / Indicators  Aching    Pain Type  Acute pain    Pain Frequency  Intermittent    Multiple Pain Sites  No                       [facility] Adult PT Treatment/Exercise - 05/12/18 0001      Exercises   Exercises  Lumbar;Knee/Hip      Lumbar Exercises: Stretches   Active Hamstring Stretch  Right;Left;3 reps;30 seconds      Lumbar Exercises: Aerobic   Elliptical  L1 4 min fwd; 4 min back      Lumbar Exercises: Supine   Bent Knee Raise  20 reps    Bent Knee Raise Limitations  PT cued to avoid loss of neutral pelvis    Straight Leg Raise  20 reps    Large Ball Abdominal Isometric  20 reps      Knee/Hip Exercises: Standing   Hip Flexion  Stengthening;Right;Left;20 reps;Knee bent   2#   Hip Abduction  Stengthening;2 sets;10 reps;Knee straight   2#   Hip Extension  Stengthening;Right;2 sets;10 reps;Knee straight   2#     Manual Therapy   Soft tissue mobilization  hip flexor, glutes left side       Trigger Point Dry Needling - 05/12/18 1026    Consent Given?  Yes    Muscles Treated Upper Body  Iliopsoas    Gluteus Maximus Response  Twitch response elicited;Palpable increased muscle length    Gluteus Minimus Response  Twitch response elicited;Palpable increased muscle length    Piriformis Response  Twitch response elicited;Palpable increased muscle length    Tensor Fascia Lata Response  Twitch response elicited;Palpable increased muscle  length             PT Short Term Goals - 05/05/18 0854      PT SHORT TERM GOAL #1   Title  ind with initial HEP    Time  4    Period  Weeks    Status  Achieved        PT Long Term Goals - 05/05/18 4967      PT LONG TERM GOAL #3   Title  pt will be able to bend down to pick up object from the floor with good body mechanics for decreased pain    Time  8    Period  Weeks    Status  Achieved      PT LONG TERM GOAL #4   Title  pt will report 75% less pain during the work day due to improved posture and strength   pt reports increased tolerance of sitting to 45 min from previous 15 min tolerance   Time  8    Period  Weeks    Status  On-going            Plan - 05/12/18 1018    Clinical Impression Statement  Pt became very fatigued with eliptical but felt good afterwards.  Pt had some improved muscle length and less pain with manual and dry needling techniques.  Pt will benefit from skilled PT to continue strength and endurance for reutrn to full functional activities.     PT Treatment/Interventions  ADLs/Self Care Home Management;Biofeedback;Electrical Stimulation;Iontophoresis 4mg /ml Dexamethasone;Moist Heat;Traction;Therapeutic activities;Therapeutic exercise;Neuromuscular re-education;Patient/family education;Manual techniques;Dry needling;Passive range of motion;Taping    PT Next Visit Plan  f/u on DN hip flexor left; f/u and DN as needed on Lt lumbar and hip, continue core stabilization with PT cueing for proper core/pelvic stability, quad stretch and possible DN    PT Home Exercise Plan   Access Code: [email or web address]     Consulted and Agree with Plan of Care  Patient       Patient will benefit from skilled therapeutic intervention in order to improve the following deficits and impairments:  Abnormal gait, Pain, Increased fascial restricitons, Increased muscle spasms, Postural dysfunction, Decreased strength, Decreased range of motion, Difficulty walking  Visit Diagnosis: No diagnosis found.     Problem List Patient Active Problem List   Diagnosis Date Noted  . Midline low back pain without sciatica 04/11/2018  . HYPERLIPIDEMIA-MIXED 09/25/2010  . TACHYCARDIA 11/19/2008  . PALPITATIONS 11/19/2008    Zannie Cove, PT 05/12/2018, 10:34 AM  [facility] [address] [place], [place], [address] Phone: [phone]   Fax:  [phone]  Name: DONATELLA WALSKI MRN: [id number] Date of Birth: [birth year]

## 2018-05-14 ENCOUNTER — Encounter: Payer: Self-pay | Admitting: Physical Therapy

## 2018-05-14 ENCOUNTER — Ambulatory Visit: Payer: 59 | Attending: Internal Medicine | Admitting: Physical Therapy

## 2018-05-14 DIAGNOSIS — M6281 Muscle weakness (generalized): Secondary | ICD-10-CM | POA: Insufficient documentation

## 2018-05-14 DIAGNOSIS — M62830 Muscle spasm of back: Secondary | ICD-10-CM | POA: Insufficient documentation

## 2018-05-14 DIAGNOSIS — R293 Abnormal posture: Secondary | ICD-10-CM | POA: Diagnosis not present

## 2018-05-14 NOTE — Therapy (Signed)
[facility] [address], [place], [place], [address] Phone: [phone]   Fax:  [phone]  Physical Therapy Treatment  Patient Details  Name: Barbara Carlson MRN: [id number] Date of Birth: August 01, 1961 Referring Provider (PT): Panosh, Standley Brooking, MD   Encounter Date: 05/14/2018  PT End of Session - 05/14/18 0849    Visit Number  8    Date for PT Re-Evaluation  06/13/18    PT Start Time  0849    PT Stop Time  0928    PT Time Calculation (min)  39 min    Activity Tolerance  Patient tolerated treatment well;No increased pain    Behavior During Therapy  WFL for tasks assessed/performed       Past Medical History:  Diagnosis Date  . Asthma    as a child  . Cancer ([place])    basal cell  . Costochondritis, acute    couple years ago  . Hyperlipidemia    diet controlled  . Other dyspnea and respiratory abnormality   . Palpitations   . Post-operative nausea and vomiting   . Tachycardia, unspecified     Past Surgical History:  Procedure Laterality Date  . BASAL CELL CARCINOMA EXCISION  2004   right  abd  . GANGLION CYST EXCISION  2015   right hand  . REFRACTIVE SURGERY  2003   Bil  . TONSILLECTOMY Bilateral 1987    There were no vitals filed for this visit.  Subjective Assessment - 05/14/18 0859    Subjective  I feel better.  I woke up without pain today.  I feel 80% better.  I still feel the little band when I stand and bend.  Not hurting currently while on bike.  I still have trouble with hip flexion on the left side.    Patient Stated Goals  get rid of pain    Currently in Pain?  No/denies                       [facility] Adult PT Treatment/Exercise - 05/14/18 0001      Exercises   Exercises  Lumbar      Lumbar Exercises: Stretches   Hip Flexor Stretch  Left;3 reps;30 seconds   supine and standing at step as seen in HEP   Quad Stretch  Left;3 reps;30 seconds      Lumbar Exercises:  Aerobic   Recumbent Bike  level 2 x8 min   PT presents     Lumbar Exercises: Standing   Functional Squats  20 reps   10x no weight ; 10x holding 2lb in each hand   Other Standing Lumbar Exercises  hip ext and abduction -       Lumbar Exercises: Supine   Dead Bug  20 reps;1 second   modified - 2lb in UE/LE   Large Ball Oblique Isometric  20 reps      Manual Therapy   Soft tissue mobilization  hip flexor, glutes left side               PT Short Term Goals - 05/05/18 0854      PT SHORT TERM GOAL #1   Title  ind with initial HEP    Time  4    Period  Weeks    Status  Achieved        PT Long Term Goals - 05/14/18 0903      PT LONG TERM GOAL #1  Title  ind with advanced HEP    Time  8    Period  Weeks    Status  On-going      PT LONG TERM GOAL #2   Title  FOTO < or = to 30% limited    Baseline  30 % limited on 05/14/18    Time  8    Period  Weeks    Status  Achieved      PT LONG TERM GOAL #3   Title  pt will be able to bend down to pick up object from the floor with good body mechanics for decreased pain    Status  Achieved      PT LONG TERM GOAL #4   Title  pt will report 75% less pain during the work day due to improved posture and strength    Baseline  reports 80% improved    Status  Achieved      PT LONG TERM GOAL #5   Title  Pt will demonstrate full and pain free lumbar AROM for return to prior level of activity    Baseline  bending still causes increased pain    Status  On-going            Plan - 05/14/18 0929    Clinical Impression Statement  Pt did well with exercises today.  She reports feeling 80% better and met FOTO goal.  Pt needed cues to keep weight in heel during squats but able to perform correctly after receiving cues.  Pt felt better after exercises today.  She continue to have some tight psoas and quad muscle tissue.  Pt will benefit from skilled PT to progress strength and ROM for improved function.    PT  Treatment/Interventions  ADLs/Self Care Home Management;Biofeedback;Electrical Stimulation;Iontophoresis 76m/ml Dexamethasone;Moist Heat;Traction;Therapeutic activities;Therapeutic exercise;Neuromuscular re-education;Patient/family education;Manual techniques;Dry needling;Passive range of motion;Taping    PT Next Visit Plan  glute and hip flexor strength    PT Home Exercise Plan   Access Code: [email or web address]    Consulted and Agree with Plan of Care  Patient       Patient will benefit from skilled therapeutic intervention in order to improve the following deficits and impairments:  Abnormal gait, Pain, Increased fascial restricitons, Increased muscle spasms, Postural dysfunction, Decreased strength, Decreased range of motion, Difficulty walking  Visit Diagnosis: Muscle spasm of back  Abnormal posture  Muscle weakness (generalized)     Problem List Patient Active Problem List   Diagnosis Date Noted  . Midline low back pain without sciatica 04/11/2018  . HYPERLIPIDEMIA-MIXED 09/25/2010  . TACHYCARDIA 11/19/2008  . PALPITATIONS 11/19/2008    JZannie Cove PT 05/14/2018, 9:40 AM  [facility] [address] [place] [address]Phone: [phone]  Fax:  [phone] Name: MWOODROW DULSKIMRN: [id number] of Birth: 315-Apr-1962

## 2018-05-20 ENCOUNTER — Ambulatory Visit: Payer: 59 | Admitting: Physical Therapy

## 2018-05-20 DIAGNOSIS — R293 Abnormal posture: Secondary | ICD-10-CM

## 2018-05-20 DIAGNOSIS — M62830 Muscle spasm of back: Secondary | ICD-10-CM

## 2018-05-20 DIAGNOSIS — M6281 Muscle weakness (generalized): Secondary | ICD-10-CM | POA: Diagnosis not present

## 2018-05-20 NOTE — Therapy (Signed)
[facility] [address] [place], [place], [address] Phone: [phone]   Fax:  [phone]  Physical Therapy Treatment  Patient Details  Name: [name] MRN: [id number] Date of Birth: 20-Nov-1960 Referring Provider (PT): Panosh, Standley Brooking, MD   Encounter Date: 05/20/2018  PT End of Session - 05/20/18 1541    Visit Number  9    Date for PT Re-Evaluation  06/13/18    PT Start Time  6759    PT Stop Time  1638    PT Time Calculation (min)  38 min    Activity Tolerance  Patient tolerated treatment well;No increased pain    Behavior During Therapy  WFL for tasks assessed/performed       Past Medical History:  Diagnosis Date  . Asthma    as a child  . Cancer ([place])    basal cell  . Costochondritis, acute    couple years ago  . Hyperlipidemia    diet controlled  . Other dyspnea and respiratory abnormality   . Palpitations   . Post-operative nausea and vomiting   . Tachycardia, unspecified     Past Surgical History:  Procedure Laterality Date  . BASAL CELL CARCINOMA EXCISION  2004   right  abd  . GANGLION CYST EXCISION  2015   right hand  . REFRACTIVE SURGERY  2003   Bil  . TONSILLECTOMY Bilateral 1987    There were no vitals filed for this visit.  Subjective Assessment - 05/20/18 1542    Subjective  I did exercises with ankle weights and I think I overdid it.  My whole back is sore now and not just that one spot like before.  Pt states she had to take ibuprofen several times yesterday.    Limitations  Sitting;Standing;Walking    Patient Stated Goals  get rid of pain    Currently in Pain?  Yes    Pain Score  4     Pain Location  Back    Pain Orientation  Left    Pain Descriptors / Indicators  Aching    Pain Type  Acute pain    Pain Onset  More than a month ago    Pain Frequency  Intermittent                       [facility] Adult PT Treatment/Exercise - 05/20/18 0001      Exercises   Exercises  Lumbar      Lumbar Exercises: Stretches   Active Hamstring Stretch  Right;Left;3 reps;30 seconds    Lower Trunk Rotation  5 reps    Pelvic Tilt  10 reps    Quadruped Mid Back Stretch  5 reps   cat camel   Quad Stretch  Left;3 reps;30 seconds    Other Lumbar Stretch Exercise  child's pose 3 x 10 sec      Lumbar Exercises: Aerobic   Recumbent Bike  level 2 x8 min   PT presents     Lumbar Exercises: Supine   Bent Knee Raise  20 reps    Bent Knee Raise Limitations  PT cued to perform pelvic tilt    Large Ball Abdominal Isometric  20 reps   ball overhead and rollouts     Manual Therapy   Soft tissue mobilization  lumbar paraspinals bilateral               PT Short Term Goals - 05/05/18 4665  PT SHORT TERM GOAL #1   Title  ind with initial HEP    Time  4    Period  Weeks    Status  Achieved        PT Long Term Goals - 05/14/18 3875      PT LONG TERM GOAL #1   Title  ind with advanced HEP    Time  8    Period  Weeks    Status  On-going      PT LONG TERM GOAL #2   Title  FOTO < or = to 30% limited    Baseline  30 % limited on 05/14/18    Time  8    Period  Weeks    Status  Achieved      PT LONG TERM GOAL #3   Title  pt will be able to bend down to pick up object from the floor with good body mechanics for decreased pain    Status  Achieved      PT LONG TERM GOAL #4   Title  pt will report 75% less pain during the work day due to improved posture and strength    Baseline  reports 80% improved    Status  Achieved      PT LONG TERM GOAL #5   Title  Pt will demonstrate full and pain free lumbar AROM for return to prior level of activity    Baseline  bending still causes increased pain    Status  On-going            Plan - 05/20/18 1724    Clinical Impression Statement  Pt had more tightness in lumbar region but different than previous session.  This appears to be due to overusing back muscle and justs stiffness.  Pt  responded well to soft tisse release and was eudcated on importance of continueing to stretch to release muscles.  Pt needs cues to maintain a pelvic tilt during exercises for reduced activation of lumbar extensor muscles. Pt will continue to benefit from skilled PT to address impairments for return to maximum function.    PT Treatment/Interventions  ADLs/Self Care Home Management;Biofeedback;Electrical Stimulation;Iontophoresis 4mg /ml Dexamethasone;Moist Heat;Traction;Therapeutic activities;Therapeutic exercise;Neuromuscular re-education;Patient/family education;Manual techniques;Dry needling;Passive range of motion;Taping    PT Next Visit Plan  glute and hip flexor strength, core strength, pelvic tilt, STM and DN as needed    PT Home Exercise Plan   Access Code: [email or web address]     Consulted and Agree with Plan of Care  Patient       Patient will benefit from skilled therapeutic intervention in order to improve the following deficits and impairments:  Abnormal gait, Pain, Increased fascial restricitons, Increased muscle spasms, Postural dysfunction, Decreased strength, Decreased range of motion, Difficulty walking  Visit Diagnosis: Muscle spasm of back  Abnormal posture  Muscle weakness (generalized)     Problem List Patient Active Problem List   Diagnosis Date Noted  . Midline low back pain without sciatica 04/11/2018  . HYPERLIPIDEMIA-MIXED 09/25/2010  . TACHYCARDIA 11/19/2008  . PALPITATIONS 11/19/2008    Zannie Cove, PT 05/20/2018, 5:29 PM  [facility] [address], [place], [place], [address] Phone: [phone]   Fax:  [phone]  Name: Barbara Carlson MRN: [id number] Date of Birth: 01-Jun-1961

## 2018-05-28 ENCOUNTER — Ambulatory Visit: Payer: 59 | Admitting: Physical Therapy

## 2018-05-28 ENCOUNTER — Encounter: Payer: Self-pay | Admitting: Physical Therapy

## 2018-05-28 DIAGNOSIS — R293 Abnormal posture: Secondary | ICD-10-CM

## 2018-05-28 DIAGNOSIS — M6281 Muscle weakness (generalized): Secondary | ICD-10-CM

## 2018-05-28 DIAGNOSIS — M62830 Muscle spasm of back: Secondary | ICD-10-CM | POA: Diagnosis not present

## 2018-05-28 NOTE — Therapy (Signed)
[facility] [address] [place], [place], [address] Phone: [phone]   Fax:  [phone]  Physical Therapy Treatment  Patient Details  Name: Barbara Carlson MRN: [id number] Date of Birth: [birth year] Referring Provider (PT): Panosh, Standley Brooking, MD   Encounter Date: 05/28/2018  PT End of Session - 05/28/18 0848    Visit Number  10    Date for PT Re-Evaluation  06/13/18    PT Start Time  3295    PT Stop Time  0931    PT Time Calculation (min)  44 min    Activity Tolerance  Patient tolerated treatment well;No increased pain    Behavior During Therapy  WFL for tasks assessed/performed       Past Medical History:  Diagnosis Date  . Asthma    as a child  . Cancer ([place])    basal cell  . Costochondritis, acute    couple years ago  . Hyperlipidemia    diet controlled  . Other dyspnea and respiratory abnormality   . Palpitations   . Post-operative nausea and vomiting   . Tachycardia, unspecified     Past Surgical History:  Procedure Laterality Date  . BASAL CELL CARCINOMA EXCISION  2004   right  abd  . GANGLION CYST EXCISION  2015   right hand  . REFRACTIVE SURGERY  2003   Bil  . TONSILLECTOMY Bilateral 1987    There were no vitals filed for this visit.  Subjective Assessment - 05/28/18 0850    Subjective  I feel like I've rounded the corner with my pain.  I can finally say I feel good.  I barely feel a twinge in my Lt hip.  Pt hasn't had to use ice packs, extra propping pillows in bed and has been able to sleep through the night comfortably for the last 3-4 nights.    How long can you sit comfortably?  several hours    How long can you stand comfortably?  several hours    How long can you walk comfortably?  no limitation    Currently in Pain?  No/denies                       [facility] Adult PT Treatment/Exercise - 05/28/18 0001      Exercises   Exercises  Lumbar;Knee/Hip      Lumbar  Exercises: Stretches   Hip Flexor Stretch  2 reps;20 seconds;Left    Hip Flexor Stretch Limitations  off edge of table    Other Lumbar Stretch Exercise  child's pose straight and with bil sidebending x 3 sets, 20 sec hold each      Lumbar Exercises: Standing   Other Standing Lumbar Exercises  yellow tband walkouts with core contractions   Pt cued pelvic alignment and core cueing     Lumbar Exercises: Supine   Heel Slides  20 reps    Heel Slides Limitations  PT cued maintained neutral pelvis with core and hip stabilization    Bent Knee Raise  20 reps    Bent Knee Raise Limitations  PT cued to perform pelvic tilt    Dead Bug  20 reps    Other Supine Lumbar Exercises  bent knee fall outs x 10 each with core contractions      Knee/Hip Exercises: Standing   Other Standing Knee Exercises  monster walks fwd/bwd red tband 5 steps each direction x 10 reps    Other  Standing Knee Exercises  extended arm plank at countertop band around ankles Lt marching and hip extension x 20 reps               PT Short Term Goals - 05/05/18 0854      PT SHORT TERM GOAL #1   Title  ind with initial HEP    Time  4    Period  Weeks    Status  Achieved        PT Long Term Goals - 05/14/18 7371      PT LONG TERM GOAL #1   Title  ind with advanced HEP    Time  8    Period  Weeks    Status  On-going      PT LONG TERM GOAL #2   Title  FOTO < or = to 30% limited    Baseline  30 % limited on 05/14/18    Time  8    Period  Weeks    Status  Achieved      PT LONG TERM GOAL #3   Title  pt will be able to bend down to pick up object from the floor with good body mechanics for decreased pain    Status  Achieved      PT LONG TERM GOAL #4   Title  pt will report 75% less pain during the work day due to improved posture and strength    Baseline  reports 80% improved    Status  Achieved      PT LONG TERM GOAL #5   Title  Pt will demonstrate full and pain free lumbar AROM for return to prior level  of activity    Baseline  bending still causes increased pain    Status  On-going            Plan - 05/28/18 0931    Clinical Impression Statement  Pt reported much improved pain and function today.  She is able to sleep through the night, sit and stand for hours at a time without pain and is implementing her stretches and strengthening at home.  She hasn't had to use ice for pain in over a week.  Her core recruitment is improving but she was unable to stabilize pelvis in quadruped bird dog today.  PT focused on exercises in supine for core today as well as introduced closed and open chain Lt hip strengthening for gluts and hip flexors to improve strength balance surrounding lumbo-pelvic-hip region.  She will continue to benefit from skilled intervention to address ongoing flexibility and strength deficits.    Rehab Potential  Excellent    PT Frequency  2x / week    PT Duration  8 weeks    PT Treatment/Interventions  ADLs/Self Care Home Management;Biofeedback;Electrical Stimulation;Iontophoresis 4mg /ml Dexamethasone;Moist Heat;Traction;Therapeutic activities;Therapeutic exercise;Neuromuscular re-education;Patient/family education;Manual techniques;Dry needling;Passive range of motion;Taping    PT Next Visit Plan  glute and hip flexor strength, core strength, pelvic tilt, STM and DN as needed    PT Home Exercise Plan   Access Code: [email or web address]        Patient will benefit from skilled therapeutic intervention in order to improve the following deficits and impairments:  Abnormal gait, Pain, Increased fascial restricitons, Increased muscle spasms, Postural dysfunction, Decreased strength, Decreased range of motion, Difficulty walking  Visit Diagnosis: Muscle weakness (generalized)  Abnormal posture     Problem List Patient Active Problem List   Diagnosis Date Noted  . Midline low back pain  without sciatica 04/11/2018  . HYPERLIPIDEMIA-MIXED 09/25/2010  . TACHYCARDIA 11/19/2008  .  PALPITATIONS 11/19/2008   Baruch Merl, PT 05/28/18 9:33 AM    [facility] [address] [place], [place], [address] Phone: [phone]   Fax:  [phone]  Name: Barbara Carlson MRN: [id number] Date of Birth: Mar 15, 1961

## 2018-05-30 ENCOUNTER — Encounter: Payer: Self-pay | Admitting: Physical Therapy

## 2018-05-30 ENCOUNTER — Ambulatory Visit: Payer: 59 | Admitting: Physical Therapy

## 2018-05-30 DIAGNOSIS — M62830 Muscle spasm of back: Secondary | ICD-10-CM | POA: Diagnosis not present

## 2018-05-30 DIAGNOSIS — M6281 Muscle weakness (generalized): Secondary | ICD-10-CM | POA: Diagnosis not present

## 2018-05-30 DIAGNOSIS — R293 Abnormal posture: Secondary | ICD-10-CM

## 2018-05-30 NOTE — Therapy (Signed)
[facility] [address] [place], [place], [address] Phone: [phone]   Fax:  [phone]  Physical Therapy Treatment  Patient Details  Name: Barbara Carlson MRN: [id number] Date of Birth: [birth year] Referring Provider (PT): Panosh, Standley Brooking, MD   Encounter Date: 05/30/2018  PT End of Session - 05/30/18 0807    Visit Number  11    Date for PT Re-Evaluation  06/13/18    PT Start Time  0803    PT Stop Time  0845    PT Time Calculation (min)  42 min    Activity Tolerance  Patient tolerated treatment well;No increased pain    Behavior During Therapy  WFL for tasks assessed/performed       Past Medical History:  Diagnosis Date  . Asthma    as a child  . Cancer ([place])    basal cell  . Costochondritis, acute    couple years ago  . Hyperlipidemia    diet controlled  . Other dyspnea and respiratory abnormality   . Palpitations   . Post-operative nausea and vomiting   . Tachycardia, unspecified     Past Surgical History:  Procedure Laterality Date  . BASAL CELL CARCINOMA EXCISION  2004   right  abd  . GANGLION CYST EXCISION  2015   right hand  . REFRACTIVE SURGERY  2003   Bil  . TONSILLECTOMY Bilateral 1987    There were no vitals filed for this visit.  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  She continues to feel good.  Has been busy with work this week and hasn't had as much time to do exercises.    Currently in Pain?  No/denies                       [facility] Adult PT Treatment/Exercise - 05/30/18 0001      Exercises   Exercises  Lumbar;Knee/Hip      Lumbar Exercises: Aerobic   Nustep  level 2 x 6'      Lumbar Exercises: Standing   Other  Standing Lumbar Exercises  yellow tband walkouts with core contractions   Pt cued pelvic alignment and core cueing   Other Standing Lumbar Exercises  march with single arm red tband pullbacks      Lumbar Exercises: Supine   Heel Slides  10 reps   PT progressed Pt to hovered extended leg vs heel slide   Heel Slides Limitations  Pt demonstrated improved pelvic control    Bent Knee Raise  20 reps    Bridge with clamshell  Compliant;15 reps    Bridge with [employer] Limitations  red theraband      Knee/Hip Exercises: Stretches   Other Knee/Hip Stretches  Hip flexor and quad stretch off edge of table x 30' each, 2 reps each   PT had Pt perform additional quad stretch in SL for HEP     Knee/Hip Exercises: Standing   Hip Extension  --   with hip flexion alternating, red tband around ankles x 15 e     Knee/Hip Exercises: Seated   Marching  Strengthening;Left;15 reps;Weights    Marching Limitations  3  PT Short Term Goals - 05/05/18 0854      PT SHORT TERM GOAL #1   Title  ind with initial HEP    Time  4    Period  Weeks    Status  Achieved        PT Long Term Goals - 05/14/18 5852      PT LONG TERM GOAL #1   Title  ind with advanced HEP    Time  8    Period  Weeks    Status  On-going      PT LONG TERM GOAL #2   Title  FOTO < or = to 30% limited    Baseline  30 % limited on 05/14/18    Time  8    Period  Weeks    Status  Achieved      PT LONG TERM GOAL #3   Title  pt will be able to bend down to pick up object from the floor with good body mechanics for decreased pain    Status  Achieved      PT LONG TERM GOAL #4   Title  pt will report 75% less pain during the work day due to improved posture and strength    Baseline  reports 80% improved    Status  Achieved      PT LONG TERM GOAL #5   Title  Pt will demonstrate full and pain free lumbar AROM for return to prior level of activity    Baseline  bending still causes increased pain    Status   On-going            Plan - 05/30/18 0845    Clinical Impression Statement  Pt continues to show functional improvements in her progress.  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  She is sleeping through the night without pain interruption.  She is demonstrating improved flexibility of Lt thigh and hip musculature and her core awareness is improving.  She reports some difficulty marching with Lt LE to step into pants in the AM due to feeling weakness.  PT added hip flexion marching without and with resistance today to address this functional limitation.  She will continue to benefit from continued lumbo-pelvic-hip strengthening to progress her strength and stabilization.      Rehab Potential  Excellent    PT Frequency  2x / week    PT Duration  8 weeks    PT Treatment/Interventions  ADLs/Self Care Home Management;Biofeedback;Electrical Stimulation;Iontophoresis 4mg /ml Dexamethasone;Moist Heat;Traction;Therapeutic activities;Therapeutic exercise;Neuromuscular re-education;Patient/family education;Manual techniques;Dry needling;Passive range of motion;Taping    PT Next Visit Plan  continue lumbopelvic hip strengthening, LE stretches    PT Home Exercise Plan   Access Code: [email or web address]     Consulted and Agree with Plan of Care  Patient       Patient will benefit from skilled therapeutic intervention in order to improve the following deficits and impairments:  Abnormal gait, Pain, Increased fascial restricitons, Increased muscle spasms, Postural dysfunction, Decreased strength, Decreased range of motion, Difficulty walking  Visit Diagnosis: Muscle weakness (generalized)  Abnormal posture     Problem List Patient Active Problem List   Diagnosis Date Noted  . Midline low back pain without sciatica 04/11/2018  . HYPERLIPIDEMIA-MIXED 09/25/2010  . TACHYCARDIA 11/19/2008  . PALPITATIONS 11/19/2008   Baruch Merl, PT 05/30/18 8:48 AM    [facility] [address], [place], [place],  [address] Phone: [phone]   Fax:  [phone]  Name: Barbara Carlson MRN: [id number] Date of Birth: [birth year]

## 2018-06-03 ENCOUNTER — Ambulatory Visit: Payer: 59 | Admitting: Physical Therapy

## 2018-06-03 DIAGNOSIS — M62830 Muscle spasm of back: Secondary | ICD-10-CM | POA: Diagnosis not present

## 2018-06-03 DIAGNOSIS — R293 Abnormal posture: Secondary | ICD-10-CM | POA: Diagnosis not present

## 2018-06-03 DIAGNOSIS — M6281 Muscle weakness (generalized): Secondary | ICD-10-CM

## 2018-06-03 NOTE — Therapy (Signed)
[facility] [address], [place], [place], [address] Phone: [phone]   Fax:  [phone]  Physical Therapy Treatment  Patient Details  Name: Barbara Carlson MRN: [id number] Date of Birth: 18-Apr-1961 Referring Provider (PT): Panosh, Standley Brooking, MD   Encounter Date: 06/03/2018  PT End of Session - 06/03/18 1617    Visit Number  12    Date for PT Re-Evaluation  06/13/18    PT Start Time  8416    PT Stop Time  1655    PT Time Calculation (min)  40 min    Activity Tolerance  Patient tolerated treatment well;No increased pain    Behavior During Therapy  WFL for tasks assessed/performed       Past Medical History:  Diagnosis Date  . Asthma    as a child  . Cancer ([place])    basal cell  . Costochondritis, acute    couple years ago  . Hyperlipidemia    diet controlled  . Other dyspnea and respiratory abnormality   . Palpitations   . Post-operative nausea and vomiting   . Tachycardia, unspecified     Past Surgical History:  Procedure Laterality Date  . BASAL CELL CARCINOMA EXCISION  2004   right  abd  . GANGLION CYST EXCISION  2015   right hand  . REFRACTIVE SURGERY  2003   Bil  . TONSILLECTOMY Bilateral 1987    There were no vitals filed for this visit.  Subjective Assessment - 06/03/18 1623    Subjective  Pt was helping a friend pack and noticed just some soreness but not pain.    Currently in Pain?  Yes    Pain Score  1     Pain Location  Back    Pain Orientation  Left    Pain Descriptors / Indicators  Aching    Pain Type  Acute pain    Pain Onset  More than a month ago    Pain Frequency  Intermittent    Aggravating Factors   helping her friend pack    Pain Relieving Factors  rest or moving    Effect of Pain on Daily Activities  no    Multiple Pain Sites  No                       [facility] Adult PT Treatment/Exercise - 06/03/18 0001      Exercises   Exercises  Lumbar;Knee/Hip       Lumbar Exercises: Aerobic   Nustep  level 2 x 10'      Lumbar Exercises: Standing   Wall Slides  10 reps;3 seconds   yellow band around knees   Shoulder Extension  Strengthening;Power Tower;Both;20 reps    Other Standing Lumbar Exercises  yellow band hip flex - 20x each side   Pt cued pelvic alignment and core cueing   Other Standing Lumbar Exercises  --      Lumbar Exercises: Supine   Heel Slides  10 reps   PT progressed Pt to hovered extended leg vs heel slide   Heel Slides Limitations  Pt demonstrated improved pelvic control    Bent Knee Raise  20 reps    Bridge with clamshell  Compliant;15 reps    Bridge with Ball Squeeze Limitations  red theraband      Lumbar Exercises: Sidelying   Other Sidelying Lumbar Exercises  quad stretch - 3 x 30 sec bilat  Knee/Hip Exercises: Stretches   Other Knee/Hip Stretches  Hip flexor and quad stretch off edge of table x 30' each, 2 reps each   PT had Pt perform additional quad stretch in SL for HEP     Knee/Hip Exercises: Standing   Hip Extension  --   with hip flexion alternating, red tband around ankles x 15 e     Knee/Hip Exercises: Seated   Marching  Strengthening;Left;15 reps;Weights    Marching Limitations  3             PT Education - 06/03/18 1700    Education Details   Access Code: [email or web address]     Person(s) Educated  Patient    Methods  Explanation;Demonstration;Verbal cues;Handout    Comprehension  Verbalized understanding;Returned demonstration       PT Short Term Goals - 05/05/18 0854      PT SHORT TERM GOAL #1   Title  ind with initial HEP    Time  4    Period  Weeks    Status  Achieved        PT Long Term Goals - 05/14/18 2355      PT LONG TERM GOAL #1   Title  ind with advanced HEP    Time  8    Period  Weeks    Status  On-going      PT LONG TERM GOAL #2   Title  FOTO < or = to 30% limited    Baseline  30 % limited on 05/14/18    Time  8    Period  Weeks    Status  Achieved      PT  LONG TERM GOAL #3   Title  pt will be able to bend down to pick up object from the floor with good body mechanics for decreased pain    Status  Achieved      PT LONG TERM GOAL #4   Title  pt will report 75% less pain during the work day due to improved posture and strength    Baseline  reports 80% improved    Status  Achieved      PT LONG TERM GOAL #5   Title  Pt will demonstrate full and pain free lumbar AROM for return to prior level of activity    Baseline  bending still causes increased pain    Status  On-going            Plan - 06/03/18 1700    Clinical Impression Statement  Pt continues to demonstrate progress with functional activities.  She was able to help a friend pack and did a lot of bending only feeling very small amount of pain.  Pt has progressed excercise difficutly.  She continues to need cues to not hold breathe during functinoal movements and for keeping pelvis neutral.  Pt continues to benefit from skilled PT to regain full functional outcomes.    PT Treatment/Interventions  ADLs/Self Care Home Management;Biofeedback;Electrical Stimulation;Iontophoresis 4mg /ml Dexamethasone;Moist Heat;Traction;Therapeutic activities;Therapeutic exercise;Neuromuscular re-education;Patient/family education;Manual techniques;Dry needling;Passive range of motion;Taping    PT Next Visit Plan  continue lumbopelvic hip strengthening, LE stretches    PT Home Exercise Plan   Access Code: [email or web address]     Consulted and Agree with Plan of Care  Patient       Patient will benefit from skilled therapeutic intervention in order to improve the following deficits and impairments:  Abnormal gait, Pain, Increased fascial restricitons, Increased muscle spasms, Postural dysfunction, Decreased strength,  Decreased range of motion, Difficulty walking  Visit Diagnosis: Muscle weakness (generalized)  Abnormal posture  Muscle spasm of back     Problem List Patient Active Problem List   Diagnosis  Date Noted  . Midline low back pain without sciatica 04/11/2018  . HYPERLIPIDEMIA-MIXED 09/25/2010  . TACHYCARDIA 11/19/2008  . PALPITATIONS 11/19/2008    Zannie Cove, PT 06/03/2018, 5:05 PM  [facility] [address] [place], [place], [address] Phone: [phone]   Fax:  [phone]  Name: Barbara Carlson MRN: [id number] Date of Birth: [birth year]

## 2018-06-03 NOTE — Patient Instructions (Signed)
Access Code: [email or web address]  URL: [email or web address]  Date: 06/03/2018  Prepared by: Lovett Calender   Exercises  Supine Single Knee to Chest Stretch - 5 reps - 1 sets - 10 sec hold - 2x daily - 7x weekly  Left Standing Lateral Shift Correction at [place] - 10 reps - 1 sets - 2x daily - 7x weekly  Prone Press Up on Elbows - 10 reps - 3 sets - 5 sec hold - 1x daily - 7x weekly  Standing Lumbar Extension - 10 reps - 3 sets - 1 sec hold - 1x daily - 7x weekly  Supine Figure 4 Piriformis Stretch - 3 reps - 1 sets - 30 hold - 1x daily - 7x weekly  Supine Piriformis Stretch with Leg Straight - 3 reps - 1 sets - 30 hold - 3x daily - 7x weekly  Standing Hip Extension - 10 reps - 3 sets - 1x daily - 7x weekly  Supine Transversus Abdominis Bracing with Heel Slide - 10 reps - 3 sets - 1x daily - 7x weekly  Supine March - 10 reps - 3 sets - 1x daily - 7x weekly  Supine Transversus Abdominis Bracing with Double Leg Fallout - 10 reps - 3 sets - 1x daily - 7x weekly  Standing Hip Hinge with Dowel - 10 reps - 3 sets - 1x daily - 7x weekly  Hip Flexor Stretch at Edge of Bed - 3 reps - 1 sets - 30 sec hold - 1x daily - 7x weekly  Hip Flexor Stretch on Step - 3 reps - 1 sets - 30 sec hold - 1x daily - 7x weekly  Forward Monster Walks - 10 reps - 3 sets - 1x daily - 7x weekly  Backward Monster Walks - 10 reps - 3 sets - 1x daily - 7x weekly  Bridge with Hip Abduction and Resistance - 10 reps - 2 sets - 1x daily - 7x weekly  DNS Bug Heel Touches - 10 reps - 2 sets - 1x daily - 7x weekly  Sidelying Quadriceps Stretch - 10 reps - 3 sets - 1x daily - 7x weekly  Patient Education  Office Posture  Trigger Point Dry Needling

## 2018-06-06 ENCOUNTER — Ambulatory Visit: Payer: 59 | Admitting: Physical Therapy

## 2018-06-06 DIAGNOSIS — M6281 Muscle weakness (generalized): Secondary | ICD-10-CM | POA: Diagnosis not present

## 2018-06-06 DIAGNOSIS — M62830 Muscle spasm of back: Secondary | ICD-10-CM | POA: Diagnosis not present

## 2018-06-06 DIAGNOSIS — R293 Abnormal posture: Secondary | ICD-10-CM

## 2018-06-06 NOTE — Therapy (Signed)
[facility] [address] [place], [place], [address] Phone: [phone]   Fax:  [phone]  Physical Therapy Treatment  Patient Details  Name: Barbara Carlson MRN: [id number] Date of Birth: 1961/04/02 Referring Provider (PT): Panosh, Standley Brooking, MD   Encounter Date: 06/06/2018  PT End of Session - 06/06/18 0852    Visit Number  13    Date for PT Re-Evaluation  06/13/18    PT Start Time  0847    PT Stop Time  0927    PT Time Calculation (min)  40 min    Activity Tolerance  Patient tolerated treatment well    Behavior During Therapy  [facility] for tasks assessed/performed       Past Medical History:  Diagnosis Date  . Asthma    as a child  . Cancer ([place])    basal cell  . Costochondritis, acute    couple years ago  . Hyperlipidemia    diet controlled  . Other dyspnea and respiratory abnormality   . Palpitations   . Post-operative nausea and vomiting   . Tachycardia, unspecified     Past Surgical History:  Procedure Laterality Date  . BASAL CELL CARCINOMA EXCISION  2004   right  abd  . GANGLION CYST EXCISION  2015   right hand  . REFRACTIVE SURGERY  2003   Bil  . TONSILLECTOMY Bilateral 1987    There were no vitals filed for this visit.  Subjective Assessment - 06/06/18 0855    Subjective  Pt will be helping her friend move and so her low back is more tight and sore but not the same pain that she came in with.    Currently in Pain?  Yes    Pain Score  1     Pain Location  Back    Pain Descriptors / Indicators  Nagging    Pain Type  Acute pain    Pain Onset  More than a month ago    Pain Frequency  Intermittent    Aggravating Factors   bending forward to pack    Multiple Pain Sites  No                       [facility] Adult PT Treatment/Exercise - 06/06/18 0001      Exercises   Exercises  Lumbar;Knee/Hip      Lumbar Exercises: Stretches   Active Hamstring Stretch  Right;Left;60  seconds   against the wall   Double Knee to Chest Stretch  1 rep;30 seconds    Figure 4 Stretch  3 reps;30 seconds    Other Lumbar Stretch Exercise  adductor stretch against wall with strap - 3 x 20 sec      Lumbar Exercises: Aerobic   Nustep  level 1 x 10'   reduced resistance due to feeling sore from yesterday     Manual Therapy   Soft tissue mobilization  lumbar and thoracic paraspinals bilateral      Kinesiotix   Inhibit Muscle   2 verticle strips along paraspinals               PT Short Term Goals - 05/05/18 0854      PT SHORT TERM GOAL #1   Title  ind with initial HEP    Time  4    Period  Weeks    Status  Achieved        PT Long Term Goals -  05/14/18 0903      PT LONG TERM GOAL #1   Title  ind with advanced HEP    Time  8    Period  Weeks    Status  On-going      PT LONG TERM GOAL #2   Title  FOTO < or = to 30% limited    Baseline  30 % limited on 05/14/18    Time  8    Period  Weeks    Status  Achieved      PT LONG TERM GOAL #3   Title  pt will be able to bend down to pick up object from the floor with good body mechanics for decreased pain    Status  Achieved      PT LONG TERM GOAL #4   Title  pt will report 75% less pain during the work day due to improved posture and strength    Baseline  reports 80% improved    Status  Achieved      PT LONG TERM GOAL #5   Title  Pt will demonstrate full and pain free lumbar AROM for return to prior level of activity    Baseline  bending still causes increased pain    Status  On-going            Plan - 06/06/18 0928    Clinical Impression Statement  Pt was having more muscle spasms today due to a lot of bending forward and packing. Pt responded well to STM and taping.  She was educated in floor stretches and no increased pain but began to feel uncomfortable after the third stretch. Pt will benefit from at least one more visit to ensure clear understanding and successful transition to HEP.    PT  Treatment/Interventions  ADLs/Self Care Home Management;Biofeedback;Electrical Stimulation;Iontophoresis 4mg /ml Dexamethasone;Moist Heat;Traction;Therapeutic activities;Therapeutic exercise;Neuromuscular re-education;Patient/family education;Manual techniques;Dry needling;Passive range of motion;Taping    PT Next Visit Plan  re-eval and discharge likely    PT Home Exercise Plan   Access Code: [email or web address]     Consulted and Agree with Plan of Care  Patient       Patient will benefit from skilled therapeutic intervention in order to improve the following deficits and impairments:  Abnormal gait, Pain, Increased fascial restricitons, Increased muscle spasms, Postural dysfunction, Decreased strength, Decreased range of motion, Difficulty walking  Visit Diagnosis: Muscle weakness (generalized)  Abnormal posture  Muscle spasm of back     Problem List Patient Active Problem List   Diagnosis Date Noted  . Midline low back pain without sciatica 04/11/2018  . HYPERLIPIDEMIA-MIXED 09/25/2010  . TACHYCARDIA 11/19/2008  . PALPITATIONS 11/19/2008    Zannie Cove, PT 06/06/2018, 9:32 AM  [facility] [address] [place], [place], [address] Phone: [phone]   Fax:  [phone]  Name: Barbara Carlson MRN: [id number] Date of Birth: 1961-04-28

## 2018-06-10 ENCOUNTER — Encounter: Payer: 59 | Admitting: Physical Therapy

## 2018-06-12 ENCOUNTER — Ambulatory Visit: Payer: 59 | Admitting: Physical Therapy

## 2018-06-12 DIAGNOSIS — M62830 Muscle spasm of back: Secondary | ICD-10-CM | POA: Diagnosis not present

## 2018-06-12 DIAGNOSIS — R293 Abnormal posture: Secondary | ICD-10-CM

## 2018-06-12 DIAGNOSIS — M6281 Muscle weakness (generalized): Secondary | ICD-10-CM

## 2018-06-12 NOTE — Therapy (Addendum)
[facility] [address] [place], [place], [address] Phone: [phone]   Fax:  [phone]  Physical Therapy Treatment  Patient Details  Name: Barbara Carlson MRN: [id number] Date of Birth: 08/29/1960 Referring Provider (PT): Panosh, Standley Brooking, MD   Encounter Date: 06/12/2018  PT End of Session - 06/12/18 0855    Visit Number  14    Date for PT Re-Evaluation  06/13/18    PT Start Time  0849    PT Stop Time  0924    PT Time Calculation (min)  35 min    Activity Tolerance  Patient tolerated treatment well    Behavior During Therapy  [facility] for tasks assessed/performed       Past Medical History:  Diagnosis Date  . Asthma    as a child  . Cancer ([place])    basal cell  . Costochondritis, acute    couple years ago  . Hyperlipidemia    diet controlled  . Other dyspnea and respiratory abnormality   . Palpitations   . Post-operative nausea and vomiting   . Tachycardia, unspecified     Past Surgical History:  Procedure Laterality Date  . BASAL CELL CARCINOMA EXCISION  2004   right  abd  . GANGLION CYST EXCISION  2015   right hand  . REFRACTIVE SURGERY  2003   Bil  . TONSILLECTOMY Bilateral 1987    There were no vitals filed for this visit.  Subjective Assessment - 06/12/18 0918    Subjective  Pt was helping friend move again and feels a cramp in the back but not pain like she was initially when coming to PT.    Patient Stated Goals  get rid of pain    Currently in Pain?  No/denies         [facility] PT Assessment - 06/12/18 0001      Assessment   Medical Diagnosis  M54.5 (ICD-10-CM) - Midline low back pain without sciatica, unspecified chronicity    Referring Provider (PT)  Panosh, Standley Brooking, MD    Onset Date/Surgical Date  03/29/18      Observation/Other Assessments   Focus on Therapeutic Outcomes (FOTO)   40%      Strength   Left Hip Flexion  5/5    Left Hip Extension  5/5    Left Hip External  Rotation  5/5    Left Hip Internal Rotation  5/5    Left Hip ABduction  5/5                   [facility] Adult PT Treatment/Exercise - 06/12/18 0001      Lumbar Exercises: Stretches   Double Knee to Chest Stretch  3 reps;20 seconds   on soft foam roller   Press Ups  5 seconds;10 reps    Quad Stretch  Left;3 reps;30 seconds      Lumbar Exercises: Aerobic   Recumbent Bike  L2 x 6 min      Lumbar Exercises: Supine   Bent Knee Raise  20 reps   bent knee taps   Large Ball Abdominal Isometric  20 reps   large red ball roll out     Lumbar Exercises: Quadruped   Straight Leg Raise  5 seconds;5 reps    Opposite Arm/Leg Raise  Right arm/Left leg;Left arm/Right leg;10 reps;3 seconds      Manual Therapy   Soft tissue mobilization  QL and lumbar paraspinal  PT Short Term Goals - 05/05/18 0854      PT SHORT TERM GOAL #1   Title  ind with initial HEP    Time  4    Period  Weeks    Status  Achieved        PT Long Term Goals - 06/12/18 0932      PT LONG TERM GOAL #1   Title  ind with advanced HEP    Status  Achieved      PT LONG TERM GOAL #2   Title  FOTO < or = to 30% limited    Status  Achieved      PT LONG TERM GOAL #3   Title  pt will be able to bend down to pick up object from the floor with good body mechanics for decreased pain    Status  Achieved      PT LONG TERM GOAL #4   Title  pt will report 75% less pain during the work day due to improved posture and strength    Status  Achieved      PT LONG TERM GOAL #5   Title  Pt will demonstrate full and pain free lumbar AROM for return to prior level of activity    Status  Achieved            Plan - 06/12/18 0933    Clinical Impression Statement  Pt met all goals and is ind with advanced HEP managing her pain well.  Pt will be discharged from PT with HEP today.    PT Treatment/Interventions  ADLs/Self Care Home Management;Biofeedback;Electrical Stimulation;Iontophoresis 72m/ml  Dexamethasone;Moist Heat;Traction;Therapeutic activities;Therapeutic exercise;Neuromuscular re-education;Patient/family education;Manual techniques;Dry needling;Passive range of motion;Taping    PT Next Visit Plan  discharged today    PT Home Exercise Plan   Access Code: [email or web address]    Consulted and Agree with Plan of Care  Patient       Patient will benefit from skilled therapeutic intervention in order to improve the following deficits and impairments:  Abnormal gait, Pain, Increased fascial restricitons, Increased muscle spasms, Postural dysfunction, Decreased strength, Decreased range of motion, Difficulty walking  Visit Diagnosis: Muscle weakness (generalized)  Abnormal posture  Muscle spasm of back     Problem List Patient Active Problem List   Diagnosis Date Noted  . Midline low back pain without sciatica 04/11/2018  . HYPERLIPIDEMIA-MIXED 09/25/2010  . TACHYCARDIA 11/19/2008  . PALPITATIONS 11/19/2008    JZannie Cove PT 06/12/2018, 9:35 AM  [facility] [address] [place] [address]Phone: [phone]  Fax:  [phone] Name: Barbara LIPPOLDMRN: [id number] of Birth: 307/02/1961 PHYSICAL THERAPY DISCHARGE SUMMARY  Visits from Start of Care: 14  Current functional level related to goals / functional outcomes: See above details   Remaining deficits: See above details   Education / Equipment: HEP Plan: Patient agrees to discharge.  Patient goals were met. Patient is being discharged due to meeting the stated rehab goals.  ?????    JGoogle PT 06/12/18 9:35 AM

## 2018-07-01 DIAGNOSIS — N941 Unspecified dyspareunia: Secondary | ICD-10-CM | POA: Diagnosis not present

## 2018-07-01 DIAGNOSIS — N952 Postmenopausal atrophic vaginitis: Secondary | ICD-10-CM | POA: Diagnosis not present

## 2018-07-08 MED FILL — INTRAROSA 6.5 MG VAG INSERT: 6.5 | 28 days supply | Qty: 28 | Fill #1

## 2018-07-21 ENCOUNTER — Encounter: Payer: Self-pay | Admitting: Family Medicine

## 2018-07-23 ENCOUNTER — Other Ambulatory Visit: Payer: Self-pay

## 2018-07-23 DIAGNOSIS — R002 Palpitations: Secondary | ICD-10-CM

## 2018-07-23 MED ORDER — ATENOLOL 25 MG PO TABS: 12.5000 mg | ORAL_TABLET | Freq: Every day | ORAL | 0 refills | Status: DC

## 2018-07-23 MED FILL — ATENOLOL 25 MG TABLET: 25 | 90 days supply | Qty: 45 | Fill #0

## 2018-10-21 ENCOUNTER — Encounter: Payer: Self-pay | Admitting: Family Medicine

## 2018-10-21 ENCOUNTER — Ambulatory Visit (INDEPENDENT_AMBULATORY_CARE_PROVIDER_SITE_OTHER): Payer: 59 | Admitting: Family Medicine

## 2018-10-21 ENCOUNTER — Other Ambulatory Visit: Payer: Self-pay

## 2018-10-21 VITALS — BP 102/64 | HR 98 | Temp 97.8°F | Ht 63.5 in | Wt 141.4 lb

## 2018-10-21 DIAGNOSIS — R002 Palpitations: Secondary | ICD-10-CM | POA: Diagnosis not present

## 2018-10-21 DIAGNOSIS — Z0000 Encounter for general adult medical examination without abnormal findings: Secondary | ICD-10-CM

## 2018-10-21 LAB — LIPID PANEL
Cholesterol: 266 mg/dL — ABNORMAL HIGH (ref 0–200)
HDL: 43 mg/dL (ref >39.00)
NonHDL: 223.14
TRIGLYCERIDES: 355 mg/dL — AB (ref 0.0–149.0)
Total CHOL/HDL Ratio: 6
VLDL: 71 mg/dL — AB (ref 0.0–40.0)

## 2018-10-21 LAB — CBC WITH DIFFERENTIAL/PLATELET
BASOS PCT: 0.9 % (ref 0.0–3.0)
Basophils Absolute: 0 10*3/uL (ref 0.0–0.1)
EOS ABS: 0.3 10*3/uL (ref 0.0–0.7)
Eosinophils Relative: 6.3 % — ABNORMAL HIGH (ref 0.0–5.0)
HCT: 37 % (ref 36.0–46.0)
Hemoglobin: 12.7 g/dL (ref 12.0–15.0)
Lymphocytes Relative: 45.1 % (ref 12.0–46.0)
Lymphs Abs: 2.2 10*3/uL (ref 0.7–4.0)
MCHC: 34.3 g/dL (ref 30.0–36.0)
MCV: 87.9 fl (ref 78.0–100.0)
MONO ABS: 0.4 10*3/uL (ref 0.1–1.0)
Monocytes Relative: 8.8 % (ref 3.0–12.0)
NEUTROS ABS: 1.9 10*3/uL (ref 1.4–7.7)
Neutrophils Relative %: 38.9 % — ABNORMAL LOW (ref 43.0–77.0)
Platelets: 272 10*3/uL (ref 150.0–400.0)
RBC: 4.21 Mil/uL (ref 3.87–5.11)
RDW: 12.7 % (ref 11.5–15.5)
WBC: 5 10*3/uL (ref 4.0–10.5)

## 2018-10-21 LAB — TSH: TSH: 2.76 u[IU]/mL (ref 0.35–4.50)

## 2018-10-21 LAB — HEPATIC FUNCTION PANEL
ALBUMIN: 4.4 g/dL (ref 3.5–5.2)
ALT: 18 U/L (ref 0–35)
AST: 20 U/L (ref 0–37)
Alkaline Phosphatase: 76 U/L (ref 39–117)
BILIRUBIN TOTAL: 0.5 mg/dL (ref 0.2–1.2)
Bilirubin, Direct: 0.1 mg/dL (ref 0.0–0.3)
Total Protein: 7.2 g/dL (ref 6.0–8.3)

## 2018-10-21 LAB — BASIC METABOLIC PANEL
BUN: 23 mg/dL (ref 6–23)
CO2: 27 mEq/L (ref 19–32)
Calcium: 9.6 mg/dL (ref 8.4–10.5)
Chloride: 103 mEq/L (ref 96–112)
Creatinine, Ser: 0.79 mg/dL (ref 0.40–1.20)
GFR: 75.02 mL/min (ref >60.00)
Glucose, Bld: 91 mg/dL (ref 70–99)
POTASSIUM: 4.3 meq/L (ref 3.5–5.1)
SODIUM: 139 meq/L (ref 135–145)

## 2018-10-21 LAB — LDL CHOLESTEROL, DIRECT: Direct LDL: 147 mg/dL

## 2018-10-21 MED ORDER — ATENOLOL 25 MG PO TABS: 12.5000 mg | ORAL_TABLET | Freq: Every day | ORAL | 3 refills | Status: DC

## 2018-10-21 MED FILL — ATENOLOL 25 MG TABLET: 25 | 90 days supply | Qty: 45 | Fill #0

## 2018-10-21 NOTE — Progress Notes (Signed)
Subjective:     Patient ID: Barbara Carlson, female   DOB: 20-Apr-1961, 58 y.o.   MRN: [id number]  HPI Patient seen for physical exam.  She had colonoscopy last year and had some hyperplastic polyps and adenomatous polyp as well.  Recommended 5-year follow-up.  She sees dermatologist regularly.  She sees gynecologist every year.  Doing well overall.  She has had some palpitations in the past and takes low-dose atenolol for that.  Palpitations been stable.  She has some chest wall pain several months ago but none recently.  No exertional symptoms.  She has history of hyperlipidemia but overall low risk for CAD.  No family history of premature heart disease.  Her father died of complications of lung cancer.  Her mom apparently had cardiomegaly of uncertain origin but no known coronary disease.  Vaccines are up-to-date.  She got shingles vaccine last year  The 10-year ASCVD risk score Mikey Bussing DC Brooke Bonito., et al., 2013) is: 2.4%   Values used to calculate the score:     Age: 79 years     Sex: Female     Is Non-Hispanic African American: No     Diabetic: No     Tobacco smoker: No     Systolic Blood Pressure: 865 mmHg     Is BP treated: No     HDL Cholesterol: 43 mg/dL     Total Cholesterol: 274 mg/dL   Past Medical History:  Diagnosis Date  . Asthma    as a child  . Cancer ([place])    basal cell  . Costochondritis, acute    couple years ago  . Hyperlipidemia    diet controlled  . Other dyspnea and respiratory abnormality   . Palpitations   . Post-operative nausea and vomiting   . Tachycardia, unspecified    Past Surgical History:  Procedure Laterality Date  . BASAL CELL CARCINOMA EXCISION  2004   right  abd  . GANGLION CYST EXCISION  2015   right hand  . REFRACTIVE SURGERY  2003   Bil  . TONSILLECTOMY Bilateral 1987    reports that she has never smoked. She has never used smokeless tobacco. She reports that she does not drink alcohol or use drugs. family history includes Cancer  (age of onset: 64) in her father; Colon cancer in her maternal aunt and paternal aunt; Colon polyps in her mother; Heart disease in her mother; Hyperlipidemia in her father; Hypertension in her daughter and father. No Known Allergies   Review of Systems  Constitutional: Negative for activity change, appetite change, fatigue, fever and unexpected weight change.  HENT: Negative for ear pain, hearing loss, sore throat and trouble swallowing.   Eyes: Negative for visual disturbance.  Respiratory: Negative for cough and shortness of breath.   Cardiovascular: Negative for chest pain and palpitations.  Gastrointestinal: Negative for abdominal pain, blood in stool, constipation and diarrhea.  Genitourinary: Negative for dysuria and hematuria.  Musculoskeletal: Negative for arthralgias, back pain and myalgias.  Skin: Negative for rash.  Neurological: Negative for dizziness, syncope and headaches.  Hematological: Negative for adenopathy.  Psychiatric/Behavioral: Negative for confusion and dysphoric mood.       Objective:   Physical Exam Constitutional:      Appearance: Normal appearance. She is well-developed.  HENT:     Head: Normocephalic and atraumatic.     Right Ear: Tympanic membrane normal.     Left Ear: Tympanic membrane normal.  Eyes:     Pupils: Pupils are equal,  round, and reactive to light.  Neck:     Musculoskeletal: Normal range of motion and neck supple.     Thyroid: No thyromegaly.  Cardiovascular:     Rate and Rhythm: Normal rate and regular rhythm.     Heart sounds: Normal heart sounds. No murmur. No gallop.   Pulmonary:     Effort: No respiratory distress.     Breath sounds: Normal breath sounds. No wheezing or rales.  Abdominal:     General: Bowel sounds are normal. There is no distension.     Palpations: Abdomen is soft. There is no mass.     Tenderness: There is no abdominal tenderness. There is no guarding or rebound.  Musculoskeletal: Normal range of motion.   Lymphadenopathy:     Cervical: No cervical adenopathy.  Skin:    Findings: No rash.  Neurological:     Mental Status: She is alert and oriented to person, place, and time.     Cranial Nerves: No cranial nerve deficit.     Deep Tendon Reflexes: Reflexes normal.  Psychiatric:        Behavior: Behavior normal.        Thought Content: Thought content normal.        Judgment: Judgment normal.        Assessment:     Physical exam.  Healthy 58 year old female.  History of palpitations currently stable on low-dose atenolol.  History of hyperlipidemia but overall low risk for CAD.    Plan:     -Obtain screening labs -Refilled atenolol for 1 year -She will continue with regular GYN follow-up as well as dermatology follow-up -Increase regular exercise -She will need repeat colonoscopy in 4 years  Eulas Post MD [facility] at [facility]

## 2018-10-21 NOTE — Patient Instructions (Signed)
Preventive Care 40-64 Years, Female Preventive care refers to lifestyle choices and visits with your health care provider that can promote health and wellness. What does preventive care include?   A yearly physical exam. This is also called an annual well check.  Dental exams once or twice a year.  Routine eye exams. Ask your health care provider how often you should have your eyes checked.  Personal lifestyle choices, including: ? Daily care of your teeth and gums. ? Regular physical activity. ? Eating a healthy diet. ? Avoiding tobacco and drug use. ? Limiting alcohol use. ? Practicing safe sex. ? Taking low-dose aspirin daily starting at age 50. ? Taking vitamin and mineral supplements as recommended by your health care provider. What happens during an annual well check? The services and screenings done by your health care provider during your annual well check will depend on your age, overall health, lifestyle risk factors, and family history of disease. Counseling Your health care provider may ask you questions about your:  Alcohol use.  Tobacco use.  Drug use.  Emotional well-being.  Home and relationship well-being.  Sexual activity.  Eating habits.  Work and work environment.  Method of birth control.  Menstrual cycle.  Pregnancy history. Screening You may have the following tests or measurements:  Height, weight, and BMI.  Blood pressure.  Lipid and cholesterol levels. These may be checked every 5 years, or more frequently if you are over 50 years old.  Skin check.  Lung cancer screening. You may have this screening every year starting at age 55 if you have a 30-pack-year history of smoking and currently smoke or have quit within the past 15 years.  Colorectal cancer screening. All adults should have this screening starting at age 50 and continuing until age 75. Your health care provider may recommend screening at age 45. You will have tests every  1-10 years, depending on your results and the type of screening test. People at increased risk should start screening at an earlier age. Screening tests may include: ? Guaiac-based fecal occult blood testing. ? Fecal immunochemical test (FIT). ? Stool DNA test. ? Virtual colonoscopy. ? Sigmoidoscopy. During this test, a flexible tube with a tiny camera (sigmoidoscope) is used to examine your rectum and lower colon. The sigmoidoscope is inserted through your anus into your rectum and lower colon. ? Colonoscopy. During this test, a long, thin, flexible tube with a tiny camera (colonoscope) is used to examine your entire colon and rectum.  Hepatitis C blood test.  Hepatitis B blood test.  Sexually transmitted disease (STD) testing.  Diabetes screening. This is done by checking your blood sugar (glucose) after you have not eaten for a while (fasting). You may have this done every 1-3 years.  Mammogram. This may be done every 1-2 years. Talk to your health care provider about when you should start having regular mammograms. This may depend on whether you have a family history of breast cancer.  BRCA-related cancer screening. This may be done if you have a family history of breast, ovarian, tubal, or peritoneal cancers.  Pelvic exam and Pap test. This may be done every 3 years starting at age 21. Starting at age 30, this may be done every 5 years if you have a Pap test in combination with an HPV test.  Bone density scan. This is done to screen for osteoporosis. You may have this scan if you are at high risk for osteoporosis. Discuss your test results, treatment options,   and if necessary, the need for more tests with your health care provider. Vaccines Your health care provider may recommend certain vaccines, such as:  Influenza vaccine. This is recommended every year.  Tetanus, diphtheria, and acellular pertussis (Tdap, Td) vaccine. You may need a Td booster every 10 years.  Varicella  vaccine. You may need this if you have not been vaccinated.  Zoster vaccine. You may need this after age 38.  Measles, mumps, and rubella (MMR) vaccine. You may need at least one dose of MMR if you were born in 1957 or later. You may also need a second dose.  Pneumococcal 13-valent conjugate (PCV13) vaccine. You may need this if you have certain conditions and were not previously vaccinated.  Pneumococcal polysaccharide (PPSV23) vaccine. You may need one or two doses if you smoke cigarettes or if you have certain conditions.  Meningococcal vaccine. You may need this if you have certain conditions.  Hepatitis A vaccine. You may need this if you have certain conditions or if you travel or work in places where you may be exposed to hepatitis A.  Hepatitis B vaccine. You may need this if you have certain conditions or if you travel or work in places where you may be exposed to hepatitis B.  Haemophilus influenzae type b (Hib) vaccine. You may need this if you have certain conditions. Talk to your health care provider about which screenings and vaccines you need and how often you need them. This information is not intended to replace advice given to you by your health care provider. Make sure you discuss any questions you have with your health care provider. Document Released: 08/26/2015 Document Revised: 09/19/2017 Document Reviewed: 05/31/2015 Elsevier Interactive Patient Education  2019 [employer].

## 2019-01-19 MED FILL — INTRAROSA 6.5 MG VAG INSERT: 6.5 | 28 days supply | Qty: 28 | Fill #2

## 2019-01-19 MED FILL — ATENOLOL 25 MG TABLET: 25 | 90 days supply | Qty: 45 | Fill #1

## 2019-04-06 MED FILL — INTRAROSA 6.5 MG VAG INSERT: 6.5 | 28 days supply | Qty: 28 | Fill #3

## 2019-04-22 MED FILL — ATENOLOL 25 MG TABLET: 25 | 90 days supply | Qty: 45 | Fill #2

## 2019-05-16 ENCOUNTER — Encounter: Payer: Self-pay | Admitting: Family Medicine

## 2019-06-02 DIAGNOSIS — L710 Perioral dermatitis: Secondary | ICD-10-CM | POA: Diagnosis not present

## 2019-06-02 DIAGNOSIS — D225 Melanocytic nevi of trunk: Secondary | ICD-10-CM | POA: Diagnosis not present

## 2019-06-02 DIAGNOSIS — Z86018 Personal history of other benign neoplasm: Secondary | ICD-10-CM | POA: Diagnosis not present

## 2019-06-02 DIAGNOSIS — D2272 Melanocytic nevi of left lower limb, including hip: Secondary | ICD-10-CM | POA: Diagnosis not present

## 2019-06-02 DIAGNOSIS — Z23 Encounter for immunization: Secondary | ICD-10-CM | POA: Diagnosis not present

## 2019-06-02 DIAGNOSIS — D485 Neoplasm of uncertain behavior of skin: Secondary | ICD-10-CM | POA: Diagnosis not present

## 2019-06-02 DIAGNOSIS — Z85828 Personal history of other malignant neoplasm of skin: Secondary | ICD-10-CM | POA: Diagnosis not present

## 2019-06-02 DIAGNOSIS — L738 Other specified follicular disorders: Secondary | ICD-10-CM | POA: Diagnosis not present

## 2019-06-02 DIAGNOSIS — D2261 Melanocytic nevi of right upper limb, including shoulder: Secondary | ICD-10-CM | POA: Diagnosis not present

## 2019-06-11 DIAGNOSIS — M5411 Radiculopathy, occipito-atlanto-axial region: Secondary | ICD-10-CM | POA: Diagnosis not present

## 2019-06-11 DIAGNOSIS — M9901 Segmental and somatic dysfunction of cervical region: Secondary | ICD-10-CM | POA: Diagnosis not present

## 2019-06-15 DIAGNOSIS — M9901 Segmental and somatic dysfunction of cervical region: Secondary | ICD-10-CM | POA: Diagnosis not present

## 2019-06-15 DIAGNOSIS — M5412 Radiculopathy, cervical region: Secondary | ICD-10-CM | POA: Diagnosis not present

## 2019-06-16 DIAGNOSIS — M5412 Radiculopathy, cervical region: Secondary | ICD-10-CM | POA: Diagnosis not present

## 2019-06-16 DIAGNOSIS — M9901 Segmental and somatic dysfunction of cervical region: Secondary | ICD-10-CM | POA: Diagnosis not present

## 2019-06-17 DIAGNOSIS — M5412 Radiculopathy, cervical region: Secondary | ICD-10-CM | POA: Diagnosis not present

## 2019-06-17 DIAGNOSIS — M9901 Segmental and somatic dysfunction of cervical region: Secondary | ICD-10-CM | POA: Diagnosis not present

## 2019-06-22 DIAGNOSIS — M5412 Radiculopathy, cervical region: Secondary | ICD-10-CM | POA: Diagnosis not present

## 2019-06-22 DIAGNOSIS — M9901 Segmental and somatic dysfunction of cervical region: Secondary | ICD-10-CM | POA: Diagnosis not present

## 2019-06-23 DIAGNOSIS — M5412 Radiculopathy, cervical region: Secondary | ICD-10-CM | POA: Diagnosis not present

## 2019-06-23 DIAGNOSIS — M9901 Segmental and somatic dysfunction of cervical region: Secondary | ICD-10-CM | POA: Diagnosis not present

## 2019-06-24 DIAGNOSIS — M5412 Radiculopathy, cervical region: Secondary | ICD-10-CM | POA: Diagnosis not present

## 2019-06-24 DIAGNOSIS — M9901 Segmental and somatic dysfunction of cervical region: Secondary | ICD-10-CM | POA: Diagnosis not present

## 2019-06-29 DIAGNOSIS — M9901 Segmental and somatic dysfunction of cervical region: Secondary | ICD-10-CM | POA: Diagnosis not present

## 2019-06-29 DIAGNOSIS — M5412 Radiculopathy, cervical region: Secondary | ICD-10-CM | POA: Diagnosis not present

## 2019-06-30 DIAGNOSIS — M9901 Segmental and somatic dysfunction of cervical region: Secondary | ICD-10-CM | POA: Diagnosis not present

## 2019-06-30 DIAGNOSIS — M5412 Radiculopathy, cervical region: Secondary | ICD-10-CM | POA: Diagnosis not present

## 2019-07-01 DIAGNOSIS — M5412 Radiculopathy, cervical region: Secondary | ICD-10-CM | POA: Diagnosis not present

## 2019-07-01 DIAGNOSIS — M9901 Segmental and somatic dysfunction of cervical region: Secondary | ICD-10-CM | POA: Diagnosis not present

## 2019-07-06 DIAGNOSIS — M9901 Segmental and somatic dysfunction of cervical region: Secondary | ICD-10-CM | POA: Diagnosis not present

## 2019-07-06 DIAGNOSIS — M5412 Radiculopathy, cervical region: Secondary | ICD-10-CM | POA: Diagnosis not present

## 2019-07-07 DIAGNOSIS — M9901 Segmental and somatic dysfunction of cervical region: Secondary | ICD-10-CM | POA: Diagnosis not present

## 2019-07-07 DIAGNOSIS — M5412 Radiculopathy, cervical region: Secondary | ICD-10-CM | POA: Diagnosis not present

## 2019-07-08 DIAGNOSIS — M9901 Segmental and somatic dysfunction of cervical region: Secondary | ICD-10-CM | POA: Diagnosis not present

## 2019-07-08 DIAGNOSIS — M5412 Radiculopathy, cervical region: Secondary | ICD-10-CM | POA: Diagnosis not present

## 2019-07-13 DIAGNOSIS — M9901 Segmental and somatic dysfunction of cervical region: Secondary | ICD-10-CM | POA: Diagnosis not present

## 2019-07-13 DIAGNOSIS — M5412 Radiculopathy, cervical region: Secondary | ICD-10-CM | POA: Diagnosis not present

## 2019-07-22 MED FILL — ATENOLOL 25 MG TABLET: 25 | 90 days supply | Qty: 45 | Fill #3

## 2019-10-29 ENCOUNTER — Encounter: Payer: Self-pay | Admitting: Family Medicine

## 2019-10-29 DIAGNOSIS — R002 Palpitations: Secondary | ICD-10-CM

## 2019-11-02 MED ORDER — ATENOLOL 25 MG PO TABS: 12.5000 mg | ORAL_TABLET | Freq: Every day | ORAL | 1 refills | Status: DC

## 2019-11-02 MED FILL — ATENOLOL 25 MG TABLET: 25 | 90 days supply | Qty: 45 | Fill #0

## 2019-11-10 ENCOUNTER — Ambulatory Visit (INDEPENDENT_AMBULATORY_CARE_PROVIDER_SITE_OTHER): Payer: 59 | Admitting: Family Medicine

## 2019-11-10 ENCOUNTER — Encounter: Payer: Self-pay | Admitting: Family Medicine

## 2019-11-10 VITALS — BP 114/66 | HR 96 | Temp 97.8°F | Ht 64.0 in | Wt 142.9 lb

## 2019-11-10 DIAGNOSIS — Z0000 Encounter for general adult medical examination without abnormal findings: Secondary | ICD-10-CM

## 2019-11-10 LAB — BASIC METABOLIC PANEL
BUN: 20 mg/dL (ref 6–23)
CO2: 29 mEq/L (ref 19–32)
Calcium: 9.7 mg/dL (ref 8.4–10.5)
Chloride: 102 mEq/L (ref 96–112)
Creatinine, Ser: 0.8 mg/dL (ref 0.40–1.20)
GFR: 73.67 mL/min (ref >60.00)
Glucose, Bld: 91 mg/dL (ref 70–99)
Potassium: 4.5 mEq/L (ref 3.5–5.1)
Sodium: 137 mEq/L (ref 135–145)

## 2019-11-10 LAB — LIPID PANEL
Cholesterol: 281 mg/dL — ABNORMAL HIGH (ref 0–200)
HDL: 42.2 mg/dL (ref >39.00)
NonHDL: 238.91
Total CHOL/HDL Ratio: 7
Triglycerides: 337 mg/dL — ABNORMAL HIGH (ref 0.0–149.0)
VLDL: 67.4 mg/dL — ABNORMAL HIGH (ref 0.0–40.0)

## 2019-11-10 LAB — HEPATIC FUNCTION PANEL
ALT: 19 U/L (ref 0–35)
AST: 20 U/L (ref 0–37)
Albumin: 4.4 g/dL (ref 3.5–5.2)
Alkaline Phosphatase: 80 U/L (ref 39–117)
Bilirubin, Direct: 0.1 mg/dL (ref 0.0–0.3)
Total Bilirubin: 0.5 mg/dL (ref 0.2–1.2)
Total Protein: 7.4 g/dL (ref 6.0–8.3)

## 2019-11-10 LAB — CBC WITH DIFFERENTIAL/PLATELET
Basophils Absolute: 0 10*3/uL (ref 0.0–0.1)
Basophils Relative: 0.7 % (ref 0.0–3.0)
Eosinophils Absolute: 0.3 10*3/uL (ref 0.0–0.7)
Eosinophils Relative: 6.3 % — ABNORMAL HIGH (ref 0.0–5.0)
HCT: 38.4 % (ref 36.0–46.0)
Hemoglobin: 13 g/dL (ref 12.0–15.0)
Lymphocytes Relative: 42.9 % (ref 12.0–46.0)
Lymphs Abs: 2.3 10*3/uL (ref 0.7–4.0)
MCHC: 33.8 g/dL (ref 30.0–36.0)
MCV: 89.2 fl (ref 78.0–100.0)
Monocytes Absolute: 0.4 10*3/uL (ref 0.1–1.0)
Monocytes Relative: 7.9 % (ref 3.0–12.0)
Neutro Abs: 2.3 10*3/uL (ref 1.4–7.7)
Neutrophils Relative %: 42.2 % — ABNORMAL LOW (ref 43.0–77.0)
Platelets: 292 10*3/uL (ref 150.0–400.0)
RBC: 4.31 Mil/uL (ref 3.87–5.11)
RDW: 13 % (ref 11.5–15.5)
WBC: 5.5 10*3/uL (ref 4.0–10.5)

## 2019-11-10 LAB — LDL CHOLESTEROL, DIRECT: Direct LDL: 155 mg/dL

## 2019-11-10 LAB — TSH: TSH: 2.21 u[IU]/mL (ref 0.35–4.50)

## 2019-11-10 NOTE — Patient Instructions (Signed)

## 2019-11-10 NOTE — Progress Notes (Signed)
Subjective:     Patient ID: Barbara Carlson, female   DOB: Jan 04, 1961, 59 y.o.   MRN: [id number]  HPI   Barbara Carlson is seen for well visit/physical.  She has history of some palpitations and takes atenolol though she has been skipping doses frequently and has not had any breakthrough symptoms.  She has history of mixed hyperlipidemia.  Currently not treated with statin.  No family history of premature CAD.  She works in Psychologist, educational with the hospital and is looking at possible retirement in the next couple of years.  She has a treadmill at home and exercises with that.  Health maintenance reviewed.  She has had both Covid vaccines.  Hepatitis C screening 2019 -.  Colonoscopy due January 2024.  She is due for repeat Pap smear soon and sees gynecologist for that.  She is scheduled for repeat mammogram.  She thinks she has had tetanus less than 10 years ago but will confirm through employee health.  Past Medical History:  Diagnosis Date  . Asthma    as a child  . Cancer ([place])    basal cell  . Costochondritis, acute    couple years ago  . Hyperlipidemia    diet controlled  . Other dyspnea and respiratory abnormality   . Palpitations   . Post-operative nausea and vomiting   . Tachycardia, unspecified    Past Surgical History:  Procedure Laterality Date  . BASAL CELL CARCINOMA EXCISION  2004   right  abd  . GANGLION CYST EXCISION  2015   right hand  . REFRACTIVE SURGERY  2003   Bil  . TONSILLECTOMY Bilateral 1987    reports that she has never smoked. She has never used smokeless tobacco. She reports that she does not drink alcohol or use drugs. family history includes Cancer (age of onset: 27) in her father; Colon cancer in her maternal aunt and paternal aunt; Colon polyps in her mother; Heart disease in her mother; Hyperlipidemia in her father; Hypertension in her daughter and father. No Known Allergies   Review of Systems  Constitutional: Negative for activity change,  appetite change, fatigue, fever and unexpected weight change.  HENT: Negative for ear pain, hearing loss, sore throat and trouble swallowing.   Eyes: Negative for visual disturbance.  Respiratory: Negative for cough, chest tightness, shortness of breath and wheezing.   Cardiovascular: Negative for chest pain, palpitations and leg swelling.  Gastrointestinal: Negative for abdominal pain, blood in stool, constipation and diarrhea.  Endocrine: Negative for polydipsia and polyuria.  Genitourinary: Negative for dysuria and hematuria.  Musculoskeletal: Negative for arthralgias, back pain and myalgias.  Skin: Negative for rash.  Neurological: Negative for dizziness, seizures, syncope, weakness, light-headedness and headaches.  Hematological: Negative for adenopathy.  Psychiatric/Behavioral: Negative for confusion and dysphoric mood.       Objective:   Physical Exam Constitutional:      Appearance: She is well-developed.  HENT:     Head: Normocephalic and atraumatic.  Eyes:     Pupils: Pupils are equal, round, and reactive to light.  Neck:     Thyroid: No thyromegaly.  Cardiovascular:     Rate and Rhythm: Normal rate and regular rhythm.     Heart sounds: Normal heart sounds. No murmur.  Pulmonary:     Effort: No respiratory distress.     Breath sounds: Normal breath sounds. No wheezing or rales.  Abdominal:     General: Bowel sounds are normal. There is no distension.  Palpations: Abdomen is soft. There is no mass.     Tenderness: There is no abdominal tenderness. There is no guarding or rebound.  Musculoskeletal:        General: Normal range of motion.     Cervical back: Normal range of motion and neck supple.  Lymphadenopathy:     Cervical: No cervical adenopathy.  Skin:    Findings: No rash.  Neurological:     Mental Status: She is alert and oriented to person, place, and time.     Cranial Nerves: No cranial nerve deficit.     Deep Tendon Reflexes: Reflexes normal.   Psychiatric:        Behavior: Behavior normal.        Thought Content: Thought content normal.        Judgment: Judgment normal.        Assessment:     Physical exam.  We discussed the following health maintenance issues    Plan:     -Confirm date of last tetanus -Obtain screening labs.  If LDL cholesterol remains elevated similar to last year's value consider possible statin therapy.  We discussed pros and cons of statin therapy in some detail  -Patient in process of setting up repeat mammogram -She plans to continue with her GYN follow-up for mammograms and Pap smears  Eulas Post MD [facility] at [facility]

## 2019-11-11 ENCOUNTER — Other Ambulatory Visit: Payer: Self-pay

## 2019-11-11 DIAGNOSIS — E785 Hyperlipidemia, unspecified: Secondary | ICD-10-CM

## 2019-11-11 MED ORDER — ROSUVASTATIN CALCIUM 10 MG PO TABS: 10.0000 mg | ORAL_TABLET | Freq: Every day | ORAL | 3 refills | Status: DC

## 2019-11-11 MED FILL — ROSUVASTATIN CALCIUM 10 MG: 10 | 90 days supply | Qty: 90 | Fill #0

## 2020-01-05 ENCOUNTER — Other Ambulatory Visit: Payer: Self-pay

## 2020-01-06 ENCOUNTER — Other Ambulatory Visit (INDEPENDENT_AMBULATORY_CARE_PROVIDER_SITE_OTHER): Payer: 59

## 2020-01-06 DIAGNOSIS — E785 Hyperlipidemia, unspecified: Secondary | ICD-10-CM

## 2020-01-06 LAB — HEPATIC FUNCTION PANEL
ALT: 15 U/L (ref 0–35)
AST: 15 U/L (ref 0–37)
Albumin: 4.3 g/dL (ref 3.5–5.2)
Alkaline Phosphatase: 73 U/L (ref 39–117)
Bilirubin, Direct: 0.1 mg/dL (ref 0.0–0.3)
Total Bilirubin: 0.4 mg/dL (ref 0.2–1.2)
Total Protein: 6.9 g/dL (ref 6.0–8.3)

## 2020-01-06 LAB — LIPID PANEL
Cholesterol: 179 mg/dL (ref 0–200)
HDL: 39.6 mg/dL (ref >39.00)
NonHDL: 139.21
Total CHOL/HDL Ratio: 5
Triglycerides: 241 mg/dL — ABNORMAL HIGH (ref 0.0–149.0)
VLDL: 48.2 mg/dL — ABNORMAL HIGH (ref 0.0–40.0)

## 2020-01-06 LAB — LDL CHOLESTEROL, DIRECT: Direct LDL: 97 mg/dL

## 2020-02-03 ENCOUNTER — Other Ambulatory Visit: Payer: Self-pay | Admitting: Radiology

## 2020-02-03 DIAGNOSIS — Z1231 Encounter for screening mammogram for malignant neoplasm of breast: Secondary | ICD-10-CM

## 2020-02-04 MED FILL — ATENOLOL 25 MG TABLET: 25 | 90 days supply | Qty: 45 | Fill #1

## 2020-02-09 MED FILL — ROSUVASTATIN CALCIUM 10 MG: 10 | 90 days supply | Qty: 90 | Fill #1

## 2020-02-23 ENCOUNTER — Other Ambulatory Visit: Payer: Self-pay

## 2020-02-23 ENCOUNTER — Ambulatory Visit
Admission: RE | Admit: 2020-02-23 | Discharge: 2020-02-23 | Disposition: A | Discharge status: Home / Self Care | Payer: 59 | Source: Ambulatory Visit | Attending: Radiology | Admitting: Radiology

## 2020-02-23 DIAGNOSIS — Z1231 Encounter for screening mammogram for malignant neoplasm of breast: Secondary | ICD-10-CM | POA: Diagnosis not present

## 2020-02-23 IMAGING — MG DIGITAL SCREENING BILAT W/ TOMO W/ CAD
6 of 12 series · 6 of 36 positions shown · non-contrast
Comparison: Previous exam(s).

CLINICAL DATA: Screening.

EXAM:
DIGITAL SCREENING BILATERAL MAMMOGRAM WITH TOMO AND CAD

[R MLO synth-2D (1 of 2)]
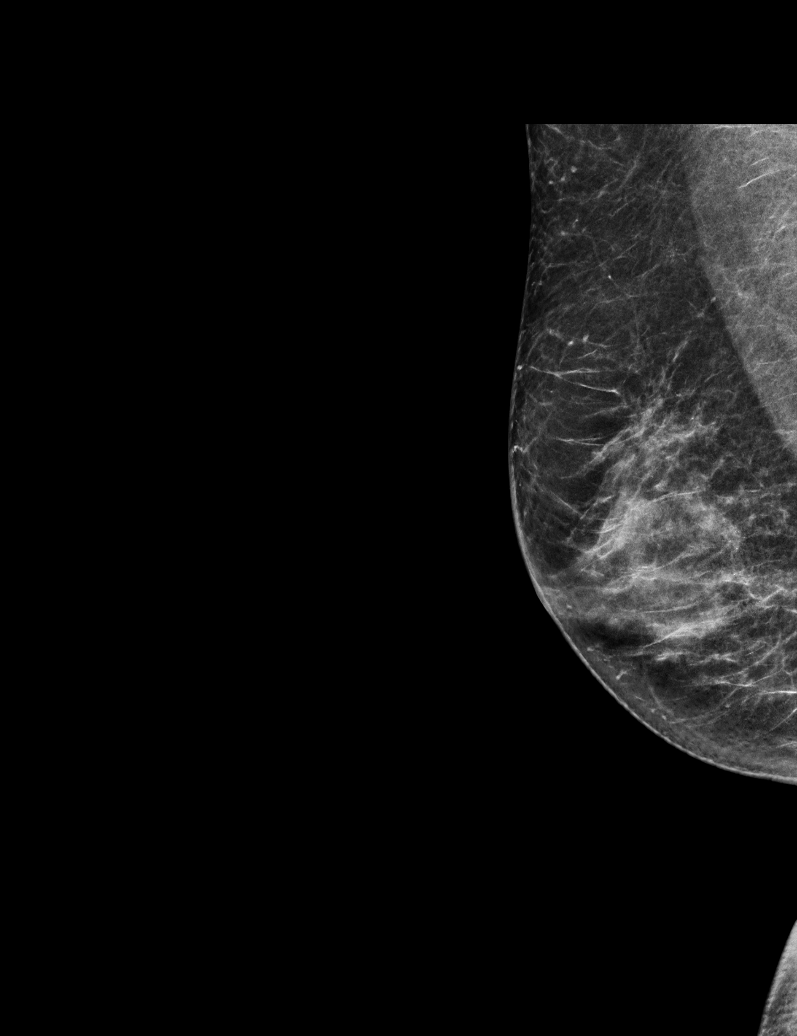

[L CC synth-2D]
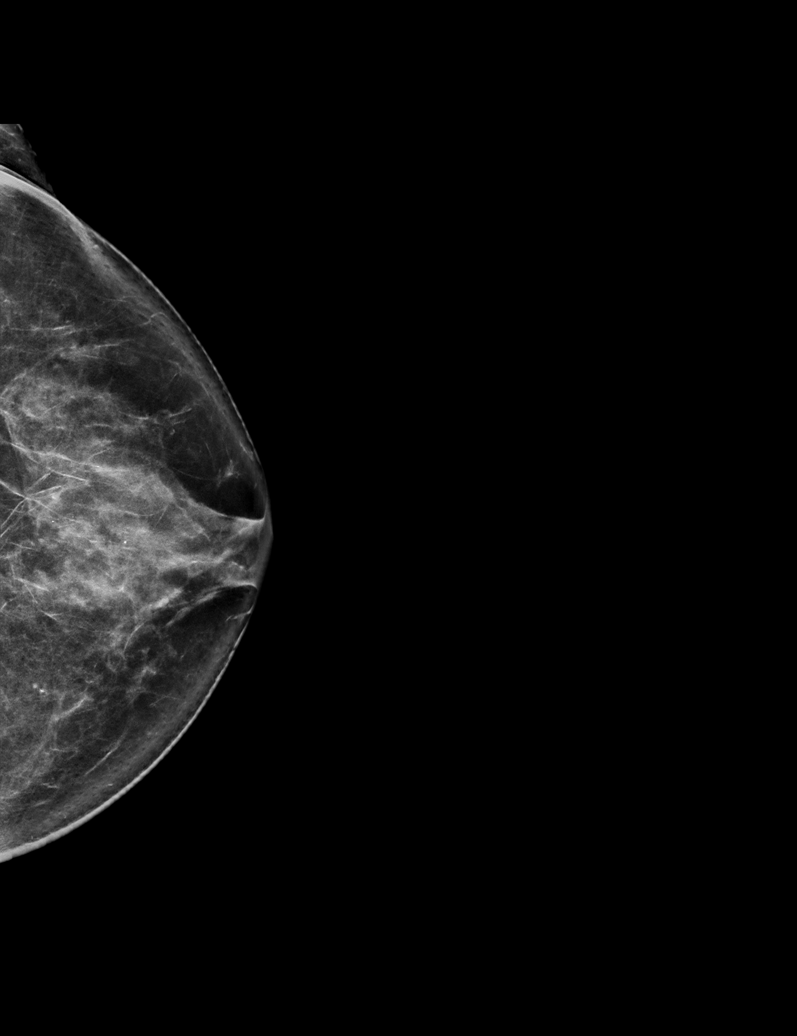

[R MLO synth-2D (2 of 2)]
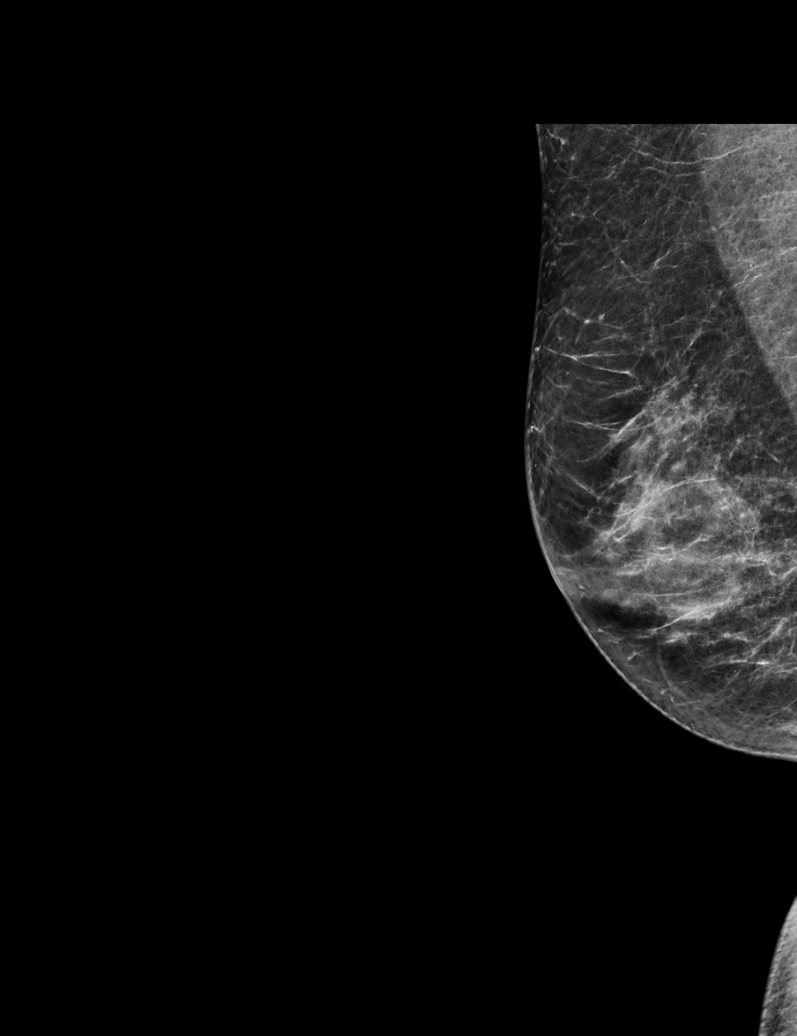

[R CC synth-2D]
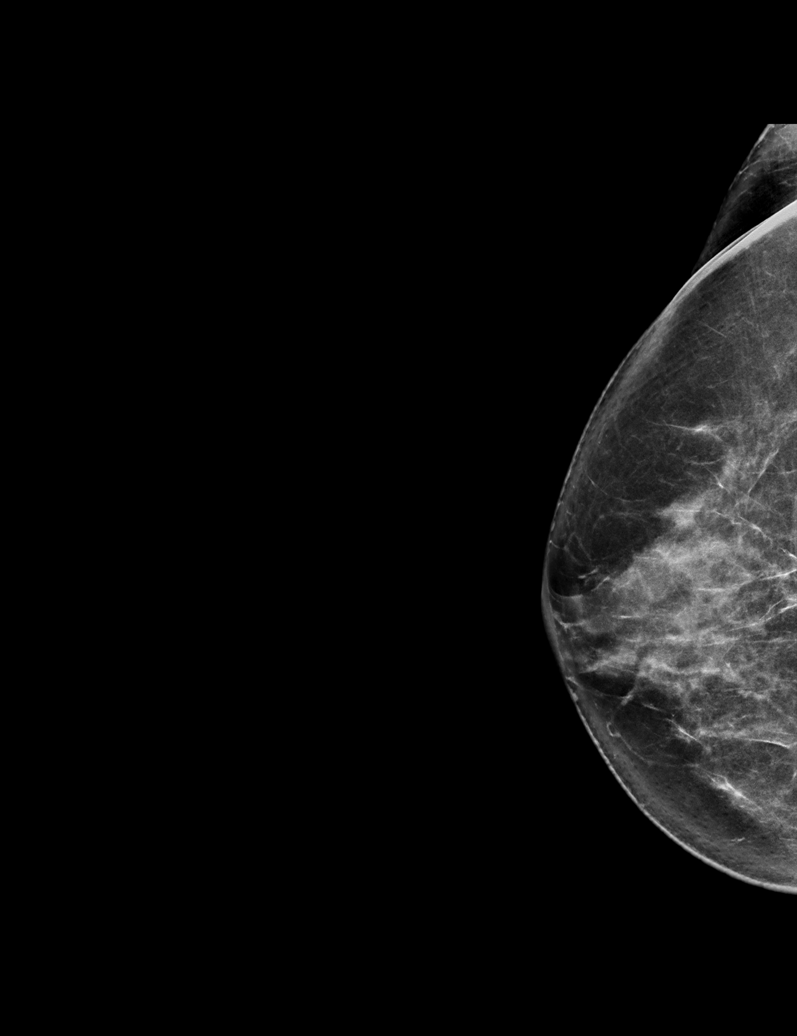

[L MLO synth-2D (1 of 2)]
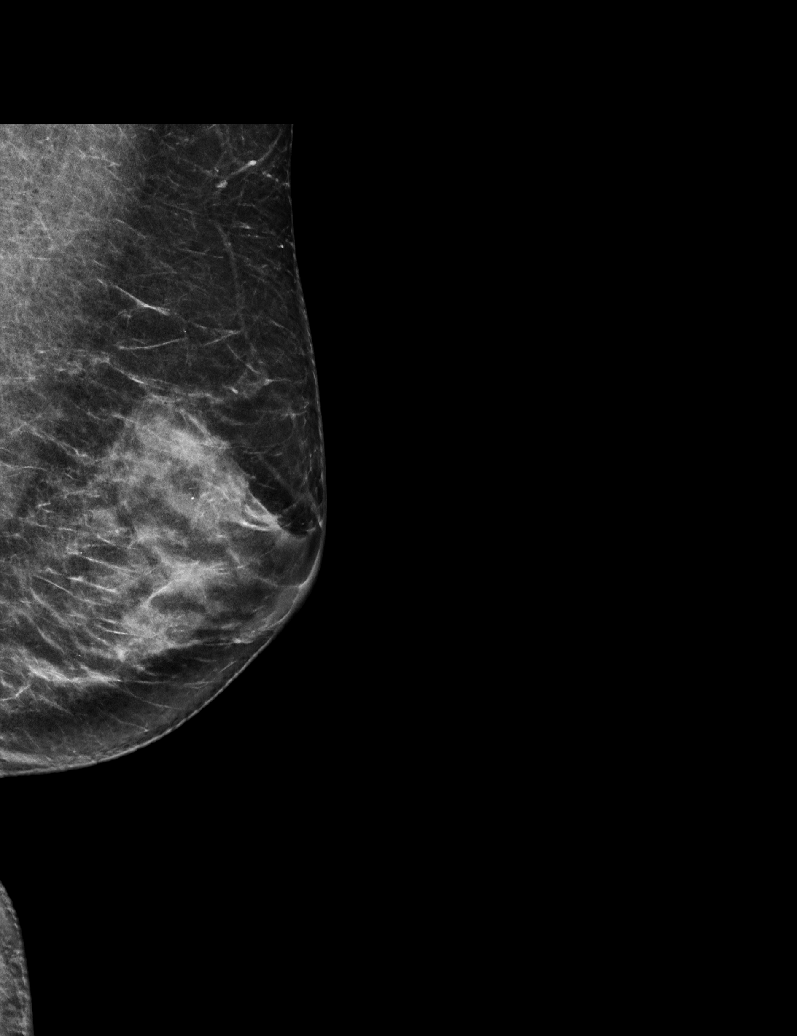

[L MLO synth-2D (2 of 2)]
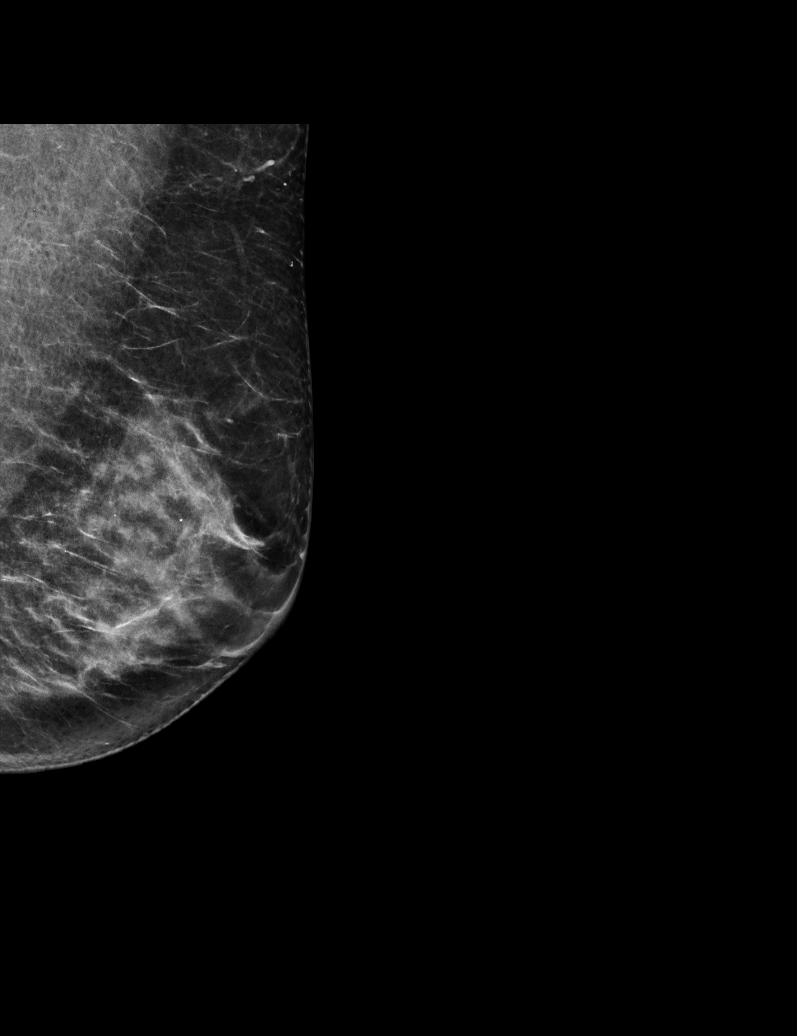

[6 of 36 positions shown; findings below may reference images not displayed]

ACR Breast Density Category c: The breast tissue is heterogeneously
dense, which may obscure small masses.
FINDINGS: There are no findings suspicious for malignancy. Images were
processed with CAD.
IMPRESSION: No mammographic evidence of malignancy. A result letter of this
screening mammogram will be mailed directly to the patient.

RECOMMENDATION:
Screening mammogram in one year. (Code:[5V])

BI-RADS CATEGORY  1: Negative.

## 2020-05-04 ENCOUNTER — Encounter: Payer: Self-pay | Admitting: Family Medicine

## 2020-05-04 ENCOUNTER — Other Ambulatory Visit: Payer: Self-pay | Admitting: Family Medicine

## 2020-05-04 DIAGNOSIS — R002 Palpitations: Secondary | ICD-10-CM

## 2020-05-04 MED ORDER — ATENOLOL 25 MG PO TABS: 12.5000 mg | ORAL_TABLET | Freq: Every day | ORAL | 1 refills | Status: DC

## 2020-05-04 MED FILL — ATENOLOL 25 MG TABLET: 25 | 90 days supply | Qty: 45 | Fill #0

## 2020-05-10 MED FILL — ROSUVASTATIN CALCIUM 10 MG: 10 | 90 days supply | Qty: 90 | Fill #2

## 2020-06-21 DIAGNOSIS — L578 Other skin changes due to chronic exposure to nonionizing radiation: Secondary | ICD-10-CM | POA: Diagnosis not present

## 2020-06-21 DIAGNOSIS — Z85828 Personal history of other malignant neoplasm of skin: Secondary | ICD-10-CM | POA: Diagnosis not present

## 2020-06-21 DIAGNOSIS — D485 Neoplasm of uncertain behavior of skin: Secondary | ICD-10-CM | POA: Diagnosis not present

## 2020-06-21 DIAGNOSIS — L821 Other seborrheic keratosis: Secondary | ICD-10-CM | POA: Diagnosis not present

## 2020-06-21 DIAGNOSIS — D225 Melanocytic nevi of trunk: Secondary | ICD-10-CM | POA: Diagnosis not present

## 2020-06-21 DIAGNOSIS — L570 Actinic keratosis: Secondary | ICD-10-CM | POA: Diagnosis not present

## 2020-06-21 DIAGNOSIS — D2261 Melanocytic nevi of right upper limb, including shoulder: Secondary | ICD-10-CM | POA: Diagnosis not present

## 2020-06-21 DIAGNOSIS — Z86018 Personal history of other benign neoplasm: Secondary | ICD-10-CM | POA: Diagnosis not present

## 2020-08-02 MED FILL — ATENOLOL 25 MG TABLET: 25 | 90 days supply | Qty: 45 | Fill #1

## 2020-08-09 MED FILL — ROSUVASTATIN CALCIUM 10 MG: 10 | 90 days supply | Qty: 90 | Fill #3

## 2020-09-19 DIAGNOSIS — M9903 Segmental and somatic dysfunction of lumbar region: Secondary | ICD-10-CM | POA: Diagnosis not present

## 2020-09-19 DIAGNOSIS — M5441 Lumbago with sciatica, right side: Secondary | ICD-10-CM | POA: Diagnosis not present

## 2020-09-20 DIAGNOSIS — M5441 Lumbago with sciatica, right side: Secondary | ICD-10-CM | POA: Diagnosis not present

## 2020-09-20 DIAGNOSIS — M9903 Segmental and somatic dysfunction of lumbar region: Secondary | ICD-10-CM | POA: Diagnosis not present

## 2020-09-21 DIAGNOSIS — M5441 Lumbago with sciatica, right side: Secondary | ICD-10-CM | POA: Diagnosis not present

## 2020-09-21 DIAGNOSIS — M9903 Segmental and somatic dysfunction of lumbar region: Secondary | ICD-10-CM | POA: Diagnosis not present

## 2020-09-22 DIAGNOSIS — M5441 Lumbago with sciatica, right side: Secondary | ICD-10-CM | POA: Diagnosis not present

## 2020-09-22 DIAGNOSIS — M9903 Segmental and somatic dysfunction of lumbar region: Secondary | ICD-10-CM | POA: Diagnosis not present

## 2020-09-23 DIAGNOSIS — M9903 Segmental and somatic dysfunction of lumbar region: Secondary | ICD-10-CM | POA: Diagnosis not present

## 2020-09-23 DIAGNOSIS — M5441 Lumbago with sciatica, right side: Secondary | ICD-10-CM | POA: Diagnosis not present

## 2020-09-26 DIAGNOSIS — M5441 Lumbago with sciatica, right side: Secondary | ICD-10-CM | POA: Diagnosis not present

## 2020-09-26 DIAGNOSIS — M9903 Segmental and somatic dysfunction of lumbar region: Secondary | ICD-10-CM | POA: Diagnosis not present

## 2020-09-27 DIAGNOSIS — M5441 Lumbago with sciatica, right side: Secondary | ICD-10-CM | POA: Diagnosis not present

## 2020-09-27 DIAGNOSIS — M9903 Segmental and somatic dysfunction of lumbar region: Secondary | ICD-10-CM | POA: Diagnosis not present

## 2020-09-28 DIAGNOSIS — M9903 Segmental and somatic dysfunction of lumbar region: Secondary | ICD-10-CM | POA: Diagnosis not present

## 2020-09-28 DIAGNOSIS — M5441 Lumbago with sciatica, right side: Secondary | ICD-10-CM | POA: Diagnosis not present

## 2020-09-30 DIAGNOSIS — M9903 Segmental and somatic dysfunction of lumbar region: Secondary | ICD-10-CM | POA: Diagnosis not present

## 2020-09-30 DIAGNOSIS — M5441 Lumbago with sciatica, right side: Secondary | ICD-10-CM | POA: Diagnosis not present

## 2020-10-04 DIAGNOSIS — M5441 Lumbago with sciatica, right side: Secondary | ICD-10-CM | POA: Diagnosis not present

## 2020-10-04 DIAGNOSIS — M9903 Segmental and somatic dysfunction of lumbar region: Secondary | ICD-10-CM | POA: Diagnosis not present

## 2020-10-06 DIAGNOSIS — M9903 Segmental and somatic dysfunction of lumbar region: Secondary | ICD-10-CM | POA: Diagnosis not present

## 2020-10-06 DIAGNOSIS — M5441 Lumbago with sciatica, right side: Secondary | ICD-10-CM | POA: Diagnosis not present

## 2020-10-11 DIAGNOSIS — M9903 Segmental and somatic dysfunction of lumbar region: Secondary | ICD-10-CM | POA: Diagnosis not present

## 2020-10-11 DIAGNOSIS — M5441 Lumbago with sciatica, right side: Secondary | ICD-10-CM | POA: Diagnosis not present

## 2020-10-16 ENCOUNTER — Encounter (HOSPITAL_BASED_OUTPATIENT_CLINIC_OR_DEPARTMENT_OTHER): Payer: Self-pay | Admitting: Emergency Medicine

## 2020-10-16 ENCOUNTER — Emergency Department (HOSPITAL_BASED_OUTPATIENT_CLINIC_OR_DEPARTMENT_OTHER): Payer: 59

## 2020-10-16 ENCOUNTER — Other Ambulatory Visit: Payer: Self-pay

## 2020-10-16 ENCOUNTER — Emergency Department (HOSPITAL_BASED_OUTPATIENT_CLINIC_OR_DEPARTMENT_OTHER)
Admission: EM | Admit: 2020-10-16 | Discharge: 2020-10-16 | Disposition: A | Discharge status: Home / Self Care | Payer: 59 | Attending: Emergency Medicine | Admitting: Emergency Medicine

## 2020-10-16 DIAGNOSIS — R079 Chest pain, unspecified: Secondary | ICD-10-CM | POA: Insufficient documentation

## 2020-10-16 DIAGNOSIS — R0602 Shortness of breath: Secondary | ICD-10-CM | POA: Diagnosis not present

## 2020-10-16 DIAGNOSIS — Z8679 Personal history of other diseases of the circulatory system: Secondary | ICD-10-CM | POA: Diagnosis not present

## 2020-10-16 DIAGNOSIS — J45909 Unspecified asthma, uncomplicated: Secondary | ICD-10-CM | POA: Insufficient documentation

## 2020-10-16 DIAGNOSIS — Z85828 Personal history of other malignant neoplasm of skin: Secondary | ICD-10-CM | POA: Insufficient documentation

## 2020-10-16 DIAGNOSIS — R0789 Other chest pain: Secondary | ICD-10-CM | POA: Diagnosis not present

## 2020-10-16 LAB — TROPONIN I (HIGH SENSITIVITY): Troponin I (High Sensitivity): <2 ng/L

## 2020-10-16 LAB — BASIC METABOLIC PANEL WITH GFR
Anion gap: 8 (ref 5–15)
BUN: 25 mg/dL — ABNORMAL HIGH (ref 6–20)
CO2: 24 mmol/L (ref 22–32)
Calcium: 9.1 mg/dL (ref 8.9–10.3)
Chloride: 107 mmol/L (ref 98–111)
Creatinine, Ser: 0.88 mg/dL (ref 0.44–1.00)
GFR, Estimated: >60 mL/min
Glucose, Bld: 103 mg/dL — ABNORMAL HIGH (ref 70–99)
Potassium: 3.9 mmol/L (ref 3.5–5.1)
Sodium: 139 mmol/L (ref 135–145)

## 2020-10-16 LAB — CBC
HCT: 36.1 % (ref 36.0–46.0)
Hemoglobin: 12.3 g/dL (ref 12.0–15.0)
MCH: 30.4 pg (ref 26.0–34.0)
MCHC: 34.1 g/dL (ref 30.0–36.0)
MCV: 89.1 fL (ref 80.0–100.0)
Platelets: 271 10*3/uL (ref 150–400)
RBC: 4.05 MIL/uL (ref 3.87–5.11)
RDW: 12 % (ref 11.5–15.5)
WBC: 5.7 10*3/uL (ref 4.0–10.5)
nRBC: 0 % (ref 0.0–0.2)

## 2020-10-16 LAB — D-DIMER, QUANTITATIVE: D-Dimer, Quant: <0.27 ug/mL-FEU (ref 0.00–0.50)

## 2020-10-16 IMAGING — CR DG CHEST 2V
2 series · 2 of 2 positions shown · non-contrast
Comparison: [DATE]

CLINICAL DATA: Chest pain.

EXAM:
CHEST - 2 VIEW

[w chest pa]
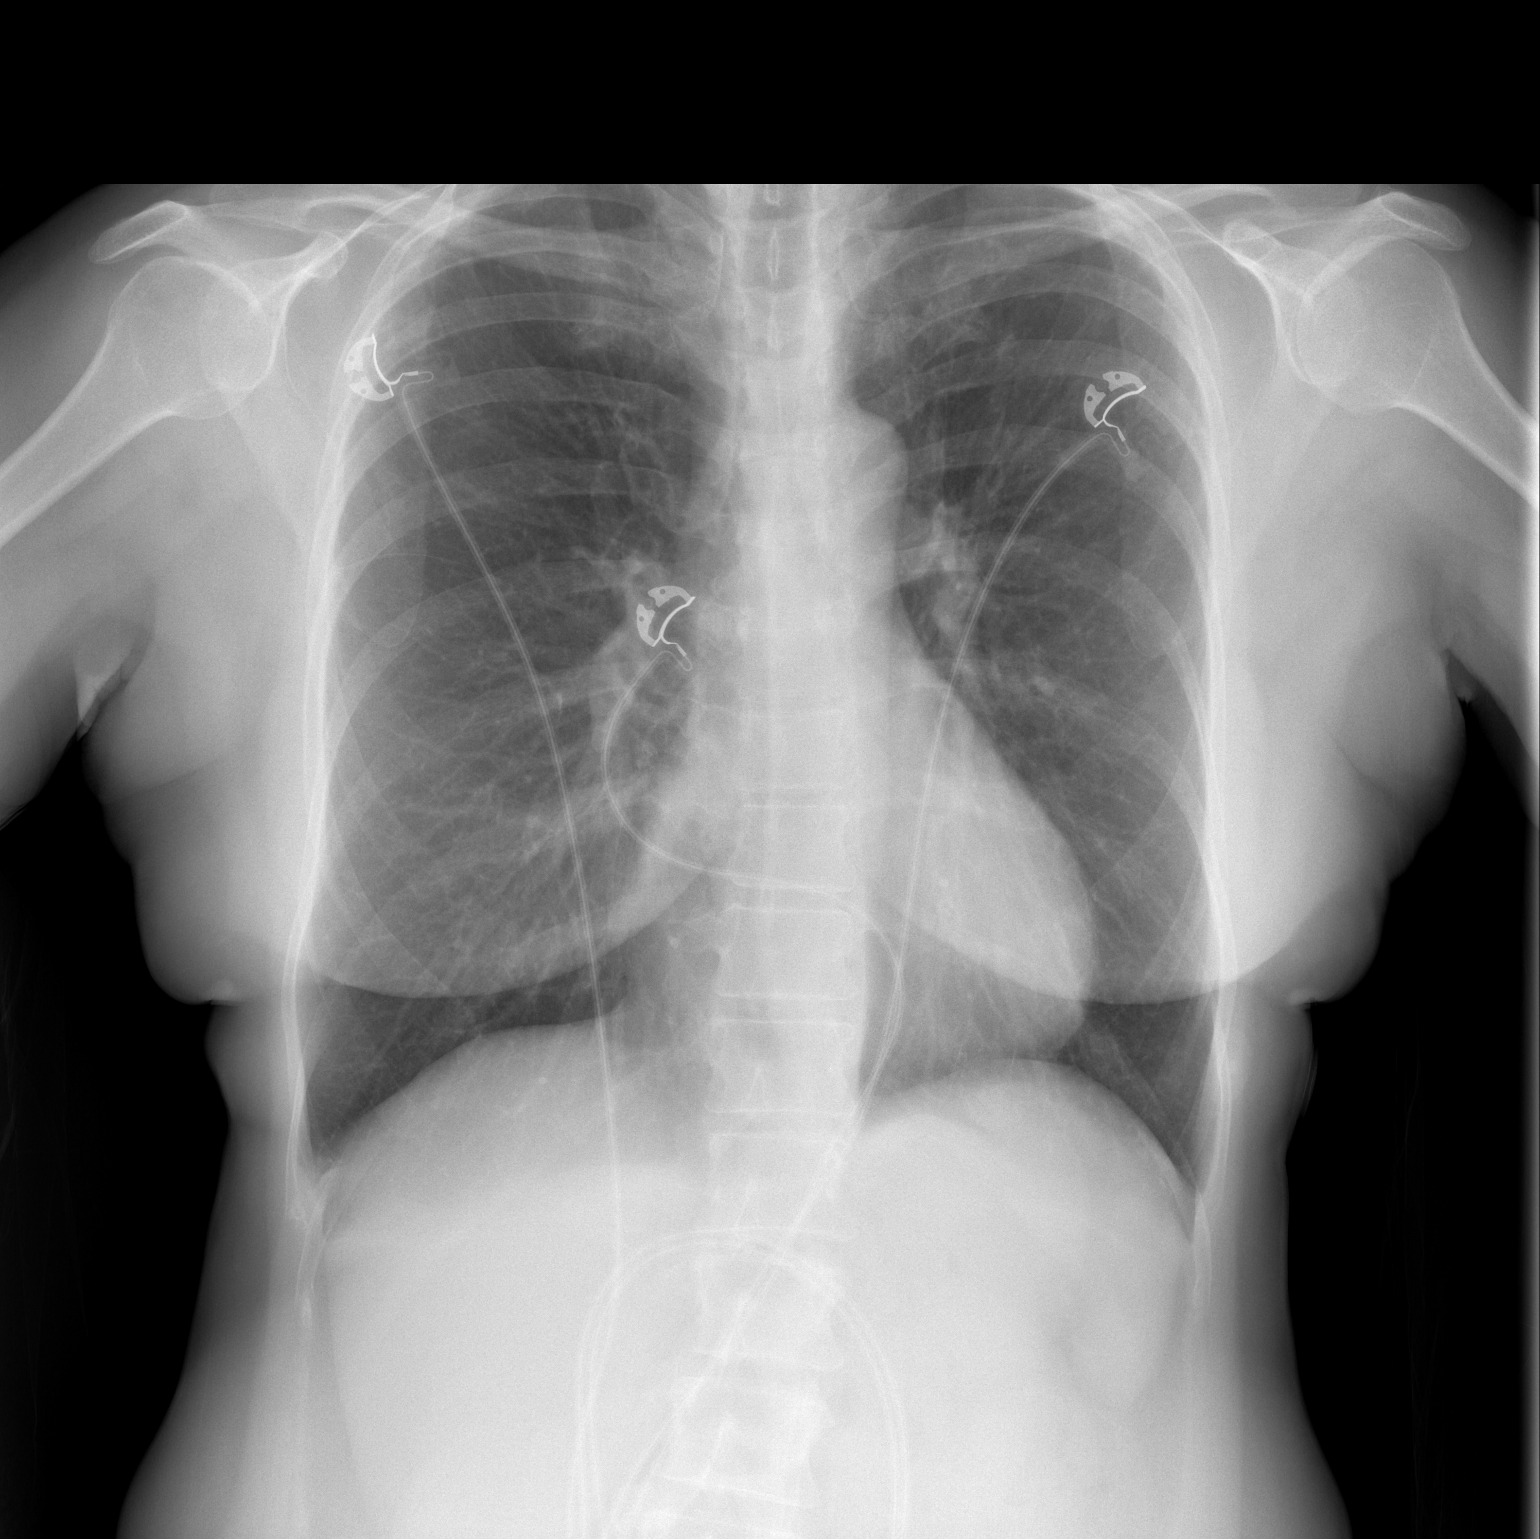

[w chest lat]
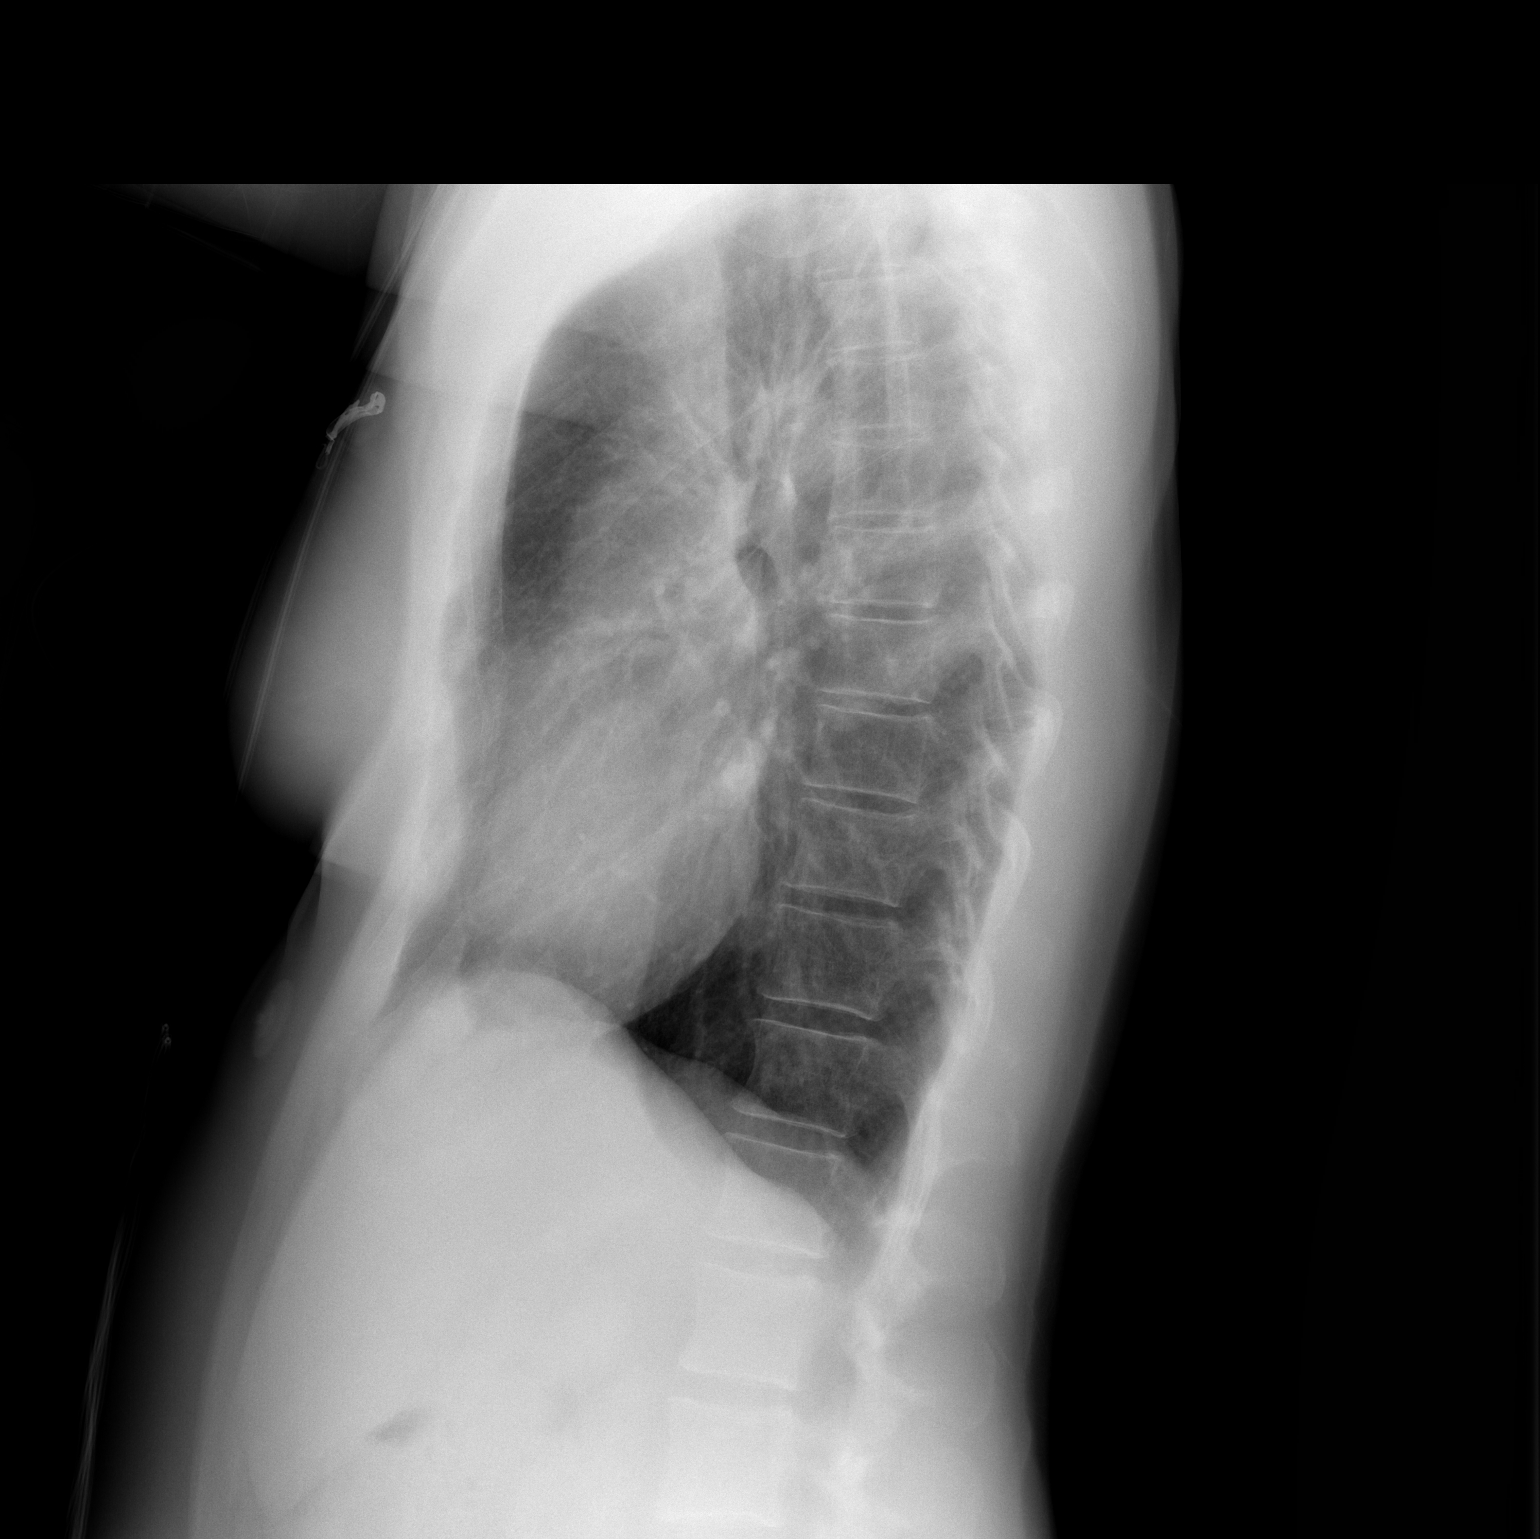

[2 of 2 positions shown; findings below may reference images not displayed]

FINDINGS: The heart, hila, and mediastinum are normal. No pneumothorax. No
nodules or masses. No focal infiltrates.
IMPRESSION: No active cardiopulmonary disease.

## 2020-10-16 NOTE — ED Triage Notes (Signed)
Pt c/o left sided chest pain onset last night described as hot burning sensation. Pain does not radiate. Pt denies N/V, shortness of breath or diaphoresis. Pt woke up today with same pain and feeling weak.

## 2020-10-16 NOTE — ED Provider Notes (Signed)
[facility] Provider Note   CSN: [id number] Arrival date & time: 10/16/20  5885     History Chief Complaint  Patient presents with  . Chest Pain    Barbara Carlson is a 60 y.o. female.  HPI Patient presents with chest pain.  Anterior upper chest.  States there is an area on her chest she can circle.  States began to have pain last night.  Burn for a little while and then it stopped and she felt very wiped out after it.  Still has some mild pain but not like it was earlier.  Feels worn out.  Pain is worse with movement.  Also worse with shortness of breath.  States a couple days ago she had some pain in her left calf.  States that is cleared up.  Does not smoke.  No history of coronary artery disease.  No fevers or chills.  Does have a previous history of costochondritis.  States this does feel somewhat different.  Pain is dull.  May have some mild radiation of the back but states her back does not hurt.    Past Medical History:  Diagnosis Date  . Asthma    as a child  . Cancer ([place])    basal cell  . Costochondritis, acute    couple years ago  . Hyperlipidemia    diet controlled  . Other dyspnea and respiratory abnormality   . Palpitations   . Post-operative nausea and vomiting   . Tachycardia, unspecified     Patient Active Problem List   Diagnosis Date Noted  . Midline low back pain without sciatica 04/11/2018  . HYPERLIPIDEMIA-MIXED 09/25/2010  . TACHYCARDIA 11/19/2008  . PALPITATIONS 11/19/2008    Past Surgical History:  Procedure Laterality Date  . BASAL CELL CARCINOMA EXCISION  2004   right  abd  . GANGLION CYST EXCISION  2015   right hand  . REFRACTIVE SURGERY  2003   Bil  . TONSILLECTOMY Bilateral 1987     OB History   No obstetric history on file.     Family History  Problem Relation Age of Onset  . Heart disease Mother        Atrial fibrillation  . Colon polyps Mother   . Cancer Father 75       lung  .  Hyperlipidemia Father   . Hypertension Father   . Hypertension Daughter   . Colon cancer Maternal Aunt   . Colon cancer Paternal Aunt     Social History   Tobacco Use  . Smoking status: Never Smoker  . Smokeless tobacco: Never Used  Vaping Use  . Vaping Use: Never used  Substance Use Topics  . Alcohol use: No  . Drug use: No    Home Medications Prior to Admission medications   Medication Sig Start Date End Date Taking? Authorizing Provider  atenolol (TENORMIN) 25 MG tablet Take 0.5 tablets (12.5 mg total) by mouth daily. 05/04/20   Burchette, Alinda Sierras, MD  Calcium Carb-Cholecalciferol (CALCIUM 600 + D PO) Take by mouth daily.    [provider]  cetirizine (ZYRTEC) 10 MG tablet Take 10 mg by mouth daily.     [provider]  Cholecalciferol (VITAMIN D3) 2000 units TABS Take by mouth daily.    [provider]  Multiple Vitamins-Calcium (ONE-A-DAY WOMENS PO) Take by mouth daily.    [provider]  pseudoephedrine (SUDAFED) 30 MG tablet Take 30 mg by mouth daily.  [provider]  rosuvastatin (CRESTOR) 10 MG tablet Take 1 tablet (10 mg total) by mouth daily. 11/11/19   Burchette, Alinda Sierras, MD  vitamin C (ASCORBIC ACID) 500 MG tablet Take 500 mg by mouth daily.    [provider]    Allergies    Patient has no known allergies.  Review of Systems   Review of Systems  Constitutional: Negative for appetite change.  HENT: Negative for congestion.   Respiratory: Negative for shortness of breath, wheezing and stridor.   Cardiovascular: Positive for chest pain. Negative for palpitations and leg swelling.  Gastrointestinal: Negative for abdominal pain.  Genitourinary: Negative for flank pain.  Musculoskeletal: Negative for back pain.  Skin: Negative for rash.  Neurological: Negative for weakness.  Psychiatric/Behavioral: Negative for confusion.    Physical Exam Updated Vital Signs BP 116/72   Pulse 78   Temp 98.6 F (37  C) (Oral)   Resp 12   Ht 5\' 3"  (1.6 m)   Wt 63.5 kg   LMP 09/05/2010   SpO2 97%   BMI 24.80 kg/m   Physical Exam Vitals reviewed.  Constitutional:      Appearance: She is well-developed.  HENT:     Head: Atraumatic.  Cardiovascular:     Rate and Rhythm: Normal rate and regular rhythm.     Heart sounds: No murmur heard.   Pulmonary:     Breath sounds: No rhonchi or rales.  Chest:     Chest wall: No tenderness.  Abdominal:     Tenderness: There is no abdominal tenderness.  Musculoskeletal:     Right lower leg: No tenderness. No edema.     Left lower leg: No tenderness. No edema.  Skin:    General: Skin is warm.     Capillary Refill: Capillary refill takes less than 2 seconds.  Neurological:     Mental Status: She is alert and oriented to person, place, and time.     ED Results / Procedures / Treatments   Labs (all labs ordered are listed, but only abnormal results are displayed) Labs Reviewed  BASIC METABOLIC PANEL - Abnormal; Notable for the following components:      Result Value   Glucose, Bld 103 (*)    BUN 25 (*)    All other components within normal limits  CBC  D-DIMER, QUANTITATIVE  TROPONIN I (HIGH SENSITIVITY)    EKG EKG Interpretation  Date/Time:  Sunday October 16 2020 09:05:33 EST Ventricular Rate:  82 PR Interval:  150 QRS Duration: 88 QT Interval:  356 QTC Calculation: 415 R Axis:   90 Text Interpretation: Normal sinus rhythm Rightward axis Borderline ECG No significant change since last tracing Confirmed by Davonna Belling [phone]) on 10/16/2020 10:42:03 AM   Radiology DG Chest 2 View  Result Date: 10/16/2020 CLINICAL DATA:  Chest pain. EXAM: CHEST - 2 VIEW COMPARISON:  February 25, 2016 FINDINGS: The heart, hila, and mediastinum are normal. No pneumothorax. No nodules or masses. No focal infiltrates. IMPRESSION: No active cardiopulmonary disease. Electronically Signed   By: Dorise Bullion III M.D   On: 10/16/2020 09:56     Procedures Procedures   Medications Ordered in ED Medications - No data to display  ED Course  I have reviewed the triage vital signs and the nursing notes.  Pertinent labs & imaging results that were available during my care of the patient were reviewed by me and considered in my medical decision making (see chart for details).    MDM  Rules/Calculators/A&P                          Patient presents with nonspecific chest pain.  Upper chest and towards the midline.  No fevers or chills.  Not exertional.  States she did not do anything she knows physically that would have hurt it but has been able to do physical activity recently.  EKG reassuring.  Chest x-ray reassuring.  D-dimer done due to recent left calf pain.  Negative D-dimer.  Troponin negative.  Doubt cardiac cause of the pain.  Doubt pulmonary embolism aortic dissection or Boerhaave's.  Patient appears stable for discharge.  Patient follow-up as needed. Final Clinical Impression(s) / ED Diagnoses Final diagnoses:  Nonspecific chest pain    Rx / DC Orders ED Discharge Orders    None       Davonna Belling, MD 10/16/20 1053

## 2020-10-17 DIAGNOSIS — M5441 Lumbago with sciatica, right side: Secondary | ICD-10-CM | POA: Diagnosis not present

## 2020-10-17 DIAGNOSIS — M9903 Segmental and somatic dysfunction of lumbar region: Secondary | ICD-10-CM | POA: Diagnosis not present

## 2020-10-19 ENCOUNTER — Ambulatory Visit: Payer: 59 | Admitting: Family Medicine

## 2020-10-19 ENCOUNTER — Other Ambulatory Visit: Payer: Self-pay

## 2020-10-19 ENCOUNTER — Encounter: Payer: Self-pay | Admitting: Family Medicine

## 2020-10-19 VITALS — BP 102/64 | HR 68 | Temp 98.2°F | Wt 139.9 lb

## 2020-10-19 DIAGNOSIS — R0789 Other chest pain: Secondary | ICD-10-CM

## 2020-10-19 DIAGNOSIS — M940 Chondrocostal junction syndrome [Tietze]: Secondary | ICD-10-CM

## 2020-10-19 NOTE — Patient Instructions (Signed)
Costochondritis  Costochondritis is inflammation of the tissue (cartilage) that connects the ribs to the breastbone (sternum). This causes pain in the front of the chest. The pain usually starts slowly and involves more than one rib. What are the causes? The exact cause of this condition is not always known. It results from stress on the cartilage where your ribs attach to your sternum. The cause of this stress could be:  Chest injury.  Exercise or activity, such as lifting.  Severe coughing. What increases the risk? You are more likely to develop this condition if you:  Are female.  Are 59-5 years old.  Recently started a new exercise or work activity.  Have low levels of vitamin D.  Have a condition that makes you cough frequently. What are the signs or symptoms? The main symptom of this condition is chest pain. The pain:  Usually starts gradually and can be sharp or dull.  Gets worse with deep breathing, coughing, or exercise.  Gets better with rest.  May be worse when you press on the affected area of your ribs and sternum. How is this diagnosed? This condition is diagnosed based on your symptoms, your medical history, and a physical exam. Your health care provider will check for pain when pressing on your sternum. You may also have tests to rule out other causes of chest pain. These may include:  A chest X-ray to check for lung problems.  An ECG (electrocardiogram) to see if you have a heart problem that could be causing the pain.  An imaging scan to rule out a chest or rib fracture. How is this treated? This condition usually goes away on its own over time. Your health care provider may prescribe an NSAID, such as ibuprofen, to reduce pain and inflammation. Treatment may also include:  Resting and avoiding activities that make pain worse.  Applying heat or ice to the area to reduce pain and inflammation.  Doing exercises to stretch your chest muscles. If these  treatments do not help, your health care provider may inject a numbing medicine at the sternum-rib connection to help relieve the pain. Follow these instructions at home: Managing pain, stiffness, and swelling  If directed, put ice on the painful area. To do this: ? Put ice in a plastic bag. ? Place a towel between your skin and the bag. ? Leave the ice on for 20 minutes, 2-3 times a day.  If directed, apply heat to the affected area as often as told by your health care provider. Use the heat source that your health care provider recommends, such as a moist heat pack or a heating pad. ? Place a towel between your skin and the heat source. ? Leave the heat on for 20-30 minutes. ? Remove the heat if your skin turns bright red. This is especially important if you are unable to feel pain, heat, or cold. You may have a greater risk of getting burned.      Activity  Rest as told by your health care provider. Avoid activities that make pain worse. This includes any activities that use chest, abdominal, and side muscles.  Do not lift anything that is heavier than 10 lb (4.5 kg), or the limit that you are told, until your health care provider says that it is safe.  Return to your normal activities as told by your health care provider. Ask your health care provider what activities are safe for you. General instructions  Take over-the-counter and prescription medicines  only as told by your health care provider.  Keep all follow-up visits as told by your health care provider. This is important. Contact a health care provider if:  You have chills or a fever.  Your pain does not go away or it gets worse.  You have a cough that does not go away. Get help right away if:  You have shortness of breath.  You have severe chest pain that is not relieved by medicines, heat, or ice. These symptoms may represent a serious problem that is an emergency. Do not wait to see if the symptoms will go away.  Get medical help right away. Call your local emergency services (911 in the [place] Do not drive yourself to the hospital.  Summary  Costochondritis is inflammation of the tissue (cartilage) that connects the ribs to the breastbone (sternum).  This condition causes pain in the front of the chest.  Costochondritis results from stress on the cartilage where your ribs attach to your sternum.  Treatment may include medicines, rest, heat or ice, and exercises. This information is not intended to replace advice given to you by your health care provider. Make sure you discuss any questions you have with your health care provider. Document Revised: 06/12/2019 Document Reviewed: 06/12/2019 Elsevier Patient Education  2021 [employer].

## 2020-10-19 NOTE — Progress Notes (Signed)
Established Patient Office Visit  Subjective:  Patient ID: Barbara Carlson, female    DOB: 1960-11-21  Age: 60 y.o. MRN: [id number]  CC: No chief complaint on file.   HPI [name] presents for recent left-sided chest pain.  Recent history is that around February 5 she had sudden onset of low back pain.  She went to chiropractor and was told that she had likely L5-S1 disc herniation.  She has been getting some chiropractic work since then and her back pain is improved.  She and her husband are no some renovations around the house.  She has done some lifting/exertion with that.  She states on 6 March she developed some sudden sharp left anterior chest pain just left of the sternum which occurred at rest.  This was very intense and lasted about 3 to 4 minutes.  She described this as a burning quality.  She had some subsequent palpitations.  She went to bed and Sunday woke up with some fatigue issues and became anxious and developed some tachycardia which she has been prone to in the past.  At that point she went to ER for further evaluation  She had multiple studies there including chest x-ray, EKG, troponins, D-dimer all negative.  Was felt she had some costochondritis.  She had plan to take some ibuprofen but her symptoms were better by Monday.  She had some recurrence of soreness yesterday and symptoms are again improved today.  Her symptoms have been consistently been produced with palpation over the area.  No pleuritic pain.  No exertional chest pain.  No dyspnea.  No nausea or vomiting.  No family history of premature CAD  Past Medical History:  Diagnosis Date  . Asthma    as a child  . Cancer ([place])    basal cell  . Costochondritis, acute    couple years ago  . Hyperlipidemia    diet controlled  . Other dyspnea and respiratory abnormality   . Palpitations   . Post-operative nausea and vomiting   . Tachycardia, unspecified     Past Surgical History:  Procedure Laterality  Date  . BASAL CELL CARCINOMA EXCISION  2004   right  abd  . GANGLION CYST EXCISION  2015   right hand  . REFRACTIVE SURGERY  2003   Bil  . TONSILLECTOMY Bilateral 1987    Family History  Problem Relation Age of Onset  . Heart disease Mother        Atrial fibrillation  . Colon polyps Mother   . Cancer Father 49       lung  . Hyperlipidemia Father   . Hypertension Father   . Hypertension Daughter   . Colon cancer Maternal Aunt   . Colon cancer Paternal Aunt     Social History   Socioeconomic History  . Marital status: Married    Spouse name: Not on file  . Number of children: Not on file  . Years of education: Not on file  . Highest education level: Not on file  Occupational History  . Occupation: Marine scientist Admin    Employer: [employer]  Tobacco Use  . Smoking status: Never Smoker  . Smokeless tobacco: Never Used  Vaping Use  . Vaping Use: Never used  Substance and Sexual Activity  . Alcohol use: No  . Drug use: No  . Sexual activity: Not on file  Other Topics Concern  . Not on file  Social History Narrative  . Not on file  Social Determinants of Health   Financial Resource Strain: Not on file  Food Insecurity: Not on file  Transportation Needs: Not on file  Physical Activity: Not on file  Stress: Not on file  Social Connections: Not on file  Intimate Partner Violence: Not on file    Outpatient Medications Prior to Visit  Medication Sig Dispense Refill  . atenolol (TENORMIN) 25 MG tablet Take 0.5 tablets (12.5 mg total) by mouth daily. 45 tablet 1  . Calcium Carb-Cholecalciferol (CALCIUM 600 + D PO) Take by mouth daily.    . cetirizine (ZYRTEC) 10 MG tablet Take 10 mg by mouth daily.    . Cholecalciferol (VITAMIN D3) 2000 units TABS Take by mouth daily.    . Multiple Vitamins-Calcium (ONE-A-DAY WOMENS PO) Take by mouth daily.    . pseudoephedrine (SUDAFED) 30 MG tablet Take 30 mg by mouth daily.    . rosuvastatin (CRESTOR) 10 MG tablet Take 1 tablet (10  mg total) by mouth daily. 90 tablet 3  . vitamin C (ASCORBIC ACID) 500 MG tablet Take 500 mg by mouth daily.     Facility-Administered Medications Prior to Visit  Medication Dose Route Frequency Provider Last Rate Last Admin  . 0.9 %  sodium chloride infusion  500 mL Intravenous Once Armbruster, Carlota Raspberry, MD        No Known Allergies  ROS Review of Systems  Constitutional: Negative for chills and fever.  Respiratory: Negative for cough, shortness of breath and wheezing.   Cardiovascular:       See HPI  Gastrointestinal: Negative for abdominal pain.  Neurological: Negative for dizziness.      Objective:    Physical Exam Vitals reviewed.  Constitutional:      Appearance: Normal appearance.  Cardiovascular:     Rate and Rhythm: Normal rate and regular rhythm.  Pulmonary:     Effort: Pulmonary effort is normal.     Breath sounds: Normal breath sounds.     Comments: Mildly tender to palpation left costochondral junction around the second and third rib region Neurological:     Mental Status: She is alert.     BP 102/64 (BP Location: Left Arm, Patient Position: Sitting, Cuff Size: Normal)   Pulse 68   Temp 98.2 F (36.8 C) (Oral)   Wt 139 lb 14.4 oz (63.5 kg)   LMP 09/05/2010   SpO2 99%   BMI 24.78 kg/m  Wt Readings from Last 3 Encounters:  10/19/20 139 lb 14.4 oz (63.5 kg)  10/16/20 140 lb (63.5 kg)  11/10/19 142 lb 14.4 oz (64.8 kg)     Health Maintenance Due  Topic Date Due  . HIV Screening  Never done  . TETANUS/TDAP  09/08/2019    There are no preventive care reminders to display for this patient.  Lab Results  Component Value Date   TSH 2.21 11/10/2019   Lab Results  Component Value Date   WBC 5.7 10/16/2020   HGB 12.3 10/16/2020   HCT 36.1 10/16/2020   MCV 89.1 10/16/2020   PLT 271 10/16/2020   Lab Results  Component Value Date   NA 139 10/16/2020   K 3.9 10/16/2020   CO2 24 10/16/2020   GLUCOSE 103 (H) 10/16/2020   BUN 25 (H) 10/16/2020    CREATININE 0.88 10/16/2020   BILITOT 0.4 01/06/2020   ALKPHOS 73 01/06/2020   AST 15 01/06/2020   ALT 15 01/06/2020   PROT 6.9 01/06/2020   ALBUMIN 4.3 01/06/2020   CALCIUM 9.1 10/16/2020  ANIONGAP 8 10/16/2020   GFR 73.67 11/10/2019   Lab Results  Component Value Date   CHOL 179 01/06/2020   Lab Results  Component Value Date   HDL 39.60 01/06/2020   No results found for: [facility] Lab Results  Component Value Date   TRIG 241.0 (H) 01/06/2020   Lab Results  Component Value Date   CHOLHDL 5 01/06/2020   No results found for: HGBA1C    Assessment & Plan:   Recent acute onset of atypical chest pain.  Suspect costochondritis.  She has reproducible tenderness on exam.  Recent ER evaluation unremarkable.  -We recommend Aleve 2 tablets every 12 hours as needed.  Could add prednisone but only if she has symptoms resistant to Aleve -Follow-up for any persistent or worsening chest pain symptoms.  No orders of the defined types were placed in this encounter.   Follow-up: No follow-ups on file.    Carolann Littler, MD

## 2020-10-24 DIAGNOSIS — M9903 Segmental and somatic dysfunction of lumbar region: Secondary | ICD-10-CM | POA: Diagnosis not present

## 2020-10-24 DIAGNOSIS — M5441 Lumbago with sciatica, right side: Secondary | ICD-10-CM | POA: Diagnosis not present

## 2020-11-03 ENCOUNTER — Other Ambulatory Visit: Payer: Self-pay | Admitting: Family Medicine

## 2020-11-03 ENCOUNTER — Encounter: Payer: Self-pay | Admitting: Family Medicine

## 2020-11-03 DIAGNOSIS — R002 Palpitations: Secondary | ICD-10-CM

## 2020-11-03 MED ORDER — ATENOLOL 25 MG PO TABS: 12.5000 mg | ORAL_TABLET | Freq: Every day | ORAL | 0 refills | Status: DC

## 2020-11-03 MED ORDER — ROSUVASTATIN CALCIUM 10 MG PO TABS: 10.0000 mg | ORAL_TABLET | Freq: Every day | ORAL | 0 refills | Status: DC

## 2020-11-03 MED FILL — ATENOLOL 25 MG TABLET: 25 | 90 days supply | Qty: 45 | Fill #0

## 2020-11-03 MED FILL — ROSUVASTATIN CALCIUM 10 MG: 10 | 90 days supply | Qty: 90 | Fill #0

## 2020-12-22 ENCOUNTER — Telehealth: Payer: Self-pay | Admitting: *Deleted

## 2020-12-22 ENCOUNTER — Other Ambulatory Visit (HOSPITAL_COMMUNITY): Payer: Self-pay

## 2020-12-22 ENCOUNTER — Encounter (INDEPENDENT_AMBULATORY_CARE_PROVIDER_SITE_OTHER): Payer: Self-pay

## 2020-12-22 ENCOUNTER — Encounter: Payer: Self-pay | Admitting: Family Medicine

## 2020-12-22 ENCOUNTER — Telehealth: Payer: 59 | Admitting: Physician Assistant

## 2020-12-22 DIAGNOSIS — U071 COVID-19: Secondary | ICD-10-CM

## 2020-12-22 MED ORDER — BENZONATATE 100 MG PO CAPS
100.0000 mg | ORAL_CAPSULE | Freq: Three times a day (TID) | ORAL | 0 refills | Status: DC | PRN
  Filled 2020-12-22: qty 30, 10d supply, fill #0

## 2020-12-22 NOTE — Progress Notes (Signed)
I have spent 5 minutes in review of e-visit questionnaire, review and updating patient chart, medical decision making and response to patient.   Atanacio Melnyk Cody Tanisa Lagace, PA-C    

## 2020-12-22 NOTE — Telephone Encounter (Signed)
BPA triggered for increased cough, temp 100.5. Pt states she did alert PCP and was prescribed Tessalon which she has since gotten from pharmacy. Questioning use of the anti-virual oral agents; states she sent MyChart message to provider. Advised to CB if any questions arise.

## 2020-12-22 NOTE — Progress Notes (Signed)
E-Visit for Positive Covid Test Result We are sorry you are not feeling well. We are here to help!  You have tested positive for COVID-19, meaning that you were infected with the novel coronavirus and could give the virus to others.  It is vitally important that you stay home so you do not spread it to others.      Please continue isolation at home, for at least 10 days since the start of your symptoms and until you have had 24 hours with no fever (without taking a fever reducer) and with improving of symptoms.  If you have no symptoms but tested positive (or all symptoms resolve after 5 days and you have no fever) you can leave your house but continue to wear a mask around others for an additional 5 days. If you have a fever,continue to stay home until you have had 24 hours of no fever. Most cases improve 5-10 days from onset but we have seen a small number of patients who have gotten worse after the 10 days.  Please be sure to watch for worsening symptoms and remain taking the proper precautions.   Go to the nearest hospital ED for assessment if fever/cough/breathlessness are severe or illness seems like a threat to life.    The following symptoms may appear 2-14 days after exposure: . Fever . Cough . Shortness of breath or difficulty breathing . Chills . Repeated shaking with chills . Muscle pain . Headache . Sore throat . New loss of taste or smell . Fatigue . Congestion or runny nose . Nausea or vomiting . Diarrhea  You have been enrolled in MyChart Home Monitoring for COVID-19. Daily you will receive a questionnaire within the MyChart website. Our COVID-19 response team will be monitoring your responses daily.  You can use medication such as prescription cough medication called Tessalon Perles 100 mg. You may take 1-2 capsules every 8 hours as needed for cough  You may also take acetaminophen (Tylenol) as needed for fever.  HOME CARE: . Only take medications as instructed by  your medical team. . Drink plenty of fluids and get plenty of rest. . A steam or ultrasonic humidifier can help if you have congestion.   GET HELP RIGHT AWAY IF YOU HAVE EMERGENCY WARNING SIGNS.  Call 911 or proceed to your closest emergency facility if: . You develop worsening high fever. . Trouble breathing . Bluish lips or face . Persistent pain or pressure in the chest . New confusion . Inability to wake or stay awake . You cough up blood. . Your symptoms become more severe . Inability to hold down food or fluids  This list is not all possible symptoms. Contact your medical provider for any symptoms that are severe or concerning to you.    Your e-visit answers were reviewed by a board certified advanced clinical practitioner to complete your personal care plan.  Depending on the condition, your plan could have included both over the counter or prescription medications.  If there is a problem please reply once you have received a response from your provider.  Your safety is important to us.  If you have drug allergies check your prescription carefully.    You can use MyChart to ask questions about today's visit, request a non-urgent call back, or ask for a work or school excuse for 24 hours related to this e-Visit. If it has been greater than 24 hours you will need to follow up with your provider, or enter   I hope that your e-visit has been valuable and will speed your recovery. Thank you for using e-visits.      

## 2021-02-07 ENCOUNTER — Ambulatory Visit (INDEPENDENT_AMBULATORY_CARE_PROVIDER_SITE_OTHER): Payer: 59 | Admitting: Family Medicine

## 2021-02-07 ENCOUNTER — Other Ambulatory Visit: Payer: Self-pay

## 2021-02-07 ENCOUNTER — Encounter: Payer: Self-pay | Admitting: Family Medicine

## 2021-02-07 ENCOUNTER — Other Ambulatory Visit (HOSPITAL_COMMUNITY): Payer: Self-pay

## 2021-02-07 VITALS — BP 124/70 | HR 91 | Temp 98.2°F | Ht 63.0 in | Wt 140.0 lb

## 2021-02-07 DIAGNOSIS — Z0000 Encounter for general adult medical examination without abnormal findings: Secondary | ICD-10-CM | POA: Diagnosis not present

## 2021-02-07 DIAGNOSIS — M461 Sacroiliitis, not elsewhere classified: Secondary | ICD-10-CM | POA: Diagnosis not present

## 2021-02-07 DIAGNOSIS — Z23 Encounter for immunization: Secondary | ICD-10-CM

## 2021-02-07 DIAGNOSIS — R002 Palpitations: Secondary | ICD-10-CM

## 2021-02-07 LAB — BASIC METABOLIC PANEL
BUN: 17 mg/dL (ref 6–23)
CO2: 27 mEq/L (ref 19–32)
Calcium: 9.8 mg/dL (ref 8.4–10.5)
Chloride: 103 mEq/L (ref 96–112)
Creatinine, Ser: 0.81 mg/dL (ref 0.40–1.20)
GFR: 79.54 mL/min (ref >60.00)
Glucose, Bld: 96 mg/dL (ref 70–99)
Potassium: 4.4 mEq/L (ref 3.5–5.1)
Sodium: 138 mEq/L (ref 135–145)

## 2021-02-07 LAB — CBC WITH DIFFERENTIAL/PLATELET
Basophils Absolute: 0.1 10*3/uL (ref 0.0–0.1)
Basophils Relative: 0.9 % (ref 0.0–3.0)
Eosinophils Absolute: 0.3 10*3/uL (ref 0.0–0.7)
Eosinophils Relative: 4.9 % (ref 0.0–5.0)
HCT: 37.6 % (ref 36.0–46.0)
Hemoglobin: 12.8 g/dL (ref 12.0–15.0)
Lymphocytes Relative: 40.3 % (ref 12.0–46.0)
Lymphs Abs: 2.5 10*3/uL (ref 0.7–4.0)
MCHC: 33.9 g/dL (ref 30.0–36.0)
MCV: 88.9 fl (ref 78.0–100.0)
Monocytes Absolute: 0.4 10*3/uL (ref 0.1–1.0)
Monocytes Relative: 6.9 % (ref 3.0–12.0)
Neutro Abs: 2.9 10*3/uL (ref 1.4–7.7)
Neutrophils Relative %: 47 % (ref 43.0–77.0)
Platelets: 307 10*3/uL (ref 150.0–400.0)
RBC: 4.23 Mil/uL (ref 3.87–5.11)
RDW: 12.5 % (ref 11.5–15.5)
WBC: 6.3 10*3/uL (ref 4.0–10.5)

## 2021-02-07 LAB — LIPID PANEL
Cholesterol: 205 mg/dL — ABNORMAL HIGH (ref 0–200)
HDL: 44.2 mg/dL (ref >39.00)
NonHDL: 161.2
Total CHOL/HDL Ratio: 5
Triglycerides: 277 mg/dL — ABNORMAL HIGH (ref 0.0–149.0)
VLDL: 55.4 mg/dL — ABNORMAL HIGH (ref 0.0–40.0)

## 2021-02-07 LAB — HEPATIC FUNCTION PANEL
ALT: 15 U/L (ref 0–35)
AST: 17 U/L (ref 0–37)
Albumin: 4.6 g/dL (ref 3.5–5.2)
Alkaline Phosphatase: 68 U/L (ref 39–117)
Bilirubin, Direct: 0.1 mg/dL (ref 0.0–0.3)
Total Bilirubin: 0.6 mg/dL (ref 0.2–1.2)
Total Protein: 7.6 g/dL (ref 6.0–8.3)

## 2021-02-07 LAB — LDL CHOLESTEROL, DIRECT: Direct LDL: 112 mg/dL

## 2021-02-07 LAB — TSH: TSH: 2.34 u[IU]/mL (ref 0.35–4.50)

## 2021-02-07 MED ORDER — ATENOLOL 25 MG PO TABS
12.5000 mg | ORAL_TABLET | Freq: Every day | ORAL | 3 refills | Status: DC
  Filled 2021-02-07: qty 45, 90d supply, fill #0
  Filled 2021-05-04: qty 45, 90d supply, fill #1

## 2021-02-07 MED ORDER — ROSUVASTATIN CALCIUM 10 MG PO TABS
10.0000 mg | ORAL_TABLET | Freq: Every day | ORAL | 3 refills | Status: DC
  Filled 2021-02-07: qty 90, 90d supply, fill #0
  Filled 2021-05-08: qty 90, 90d supply, fill #1
  Filled 2021-08-09: qty 90, 90d supply, fill #2
  Filled 2021-11-10: qty 90, 90d supply, fill #3

## 2021-02-07 NOTE — Progress Notes (Signed)
Established Patient Office Visit  Subjective:  Patient ID: Barbara Carlson, female    DOB: [birth year]  Age: 60 y.o. MRN: [id number]  CC:  Chief Complaint  Patient presents with   Annual Exam    No new concerns     HPI Barbara Carlson presents for physical exam.  She sees gynecologist yearly and also sees dermatologist yearly.  She has had multiple dysplastic nevi removed previously.  Her major issue is that she has had some chronic left SI joint pain following injury about 4 years ago.  Has been seeing chiropractor and had recent films which were unremarkable except for some curvature of the spine.  She would like to consider trial of physical therapy.  She is not gotten any benefit from chiropractic treatment over the past 4 months.  She has history of frequent palpitations and takes low-dose atenolol.  Symptoms currently stable.  She has hyperlipidemia treated with Crestor.  Family history-mother had history of atrial fibrillation.  Father had history of hyperlipidemia and hypertension and lung cancer age 47.  She has 2 brothers who are alive and without significant medical problems.  No family history of premature heart disease.    Social history-married.  No children.  Has worked for years in Psychologist, educational.  Non-smoker.  No alcohol use.  Health maintenance reviewed  -She gets yearly flu vaccines -Tetanus due now -Getting regular mammograms through GYN and these are up-to-date -Pete colonoscopy due 2024 -Pap smear up-to-date -Has had previous Shingrix vaccine -Previous hepatitis C screen negative    Past Medical History:  Diagnosis Date   Asthma    as a child   Cancer ([place])    basal cell   Costochondritis, acute    couple years ago   Hyperlipidemia    diet controlled   Other dyspnea and respiratory abnormality    Palpitations    Post-operative nausea and vomiting    Tachycardia, unspecified     Past Surgical History:  Procedure Laterality Date   BASAL  CELL CARCINOMA EXCISION  2004   right  abd   GANGLION CYST EXCISION  2015   right hand   REFRACTIVE SURGERY  2003   Bil   TONSILLECTOMY Bilateral 1987    Family History  Problem Relation Age of Onset   Heart disease Mother        Atrial fibrillation   Colon polyps Mother    Cancer Father 52       lung   Hyperlipidemia Father    Hypertension Father    Colon cancer Maternal Aunt    Colon cancer Paternal Aunt     Social History   Socioeconomic History   Marital status: Married    Spouse name: Not on file   Number of children: Not on file   Years of education: Not on file   Highest education level: Not on file  Occupational History   Occupation: Nurse Admin    Employer: [employer]  Tobacco Use   Smoking status: Never   Smokeless tobacco: Never  Vaping Use   Vaping Use: Never used  Substance and Sexual Activity   Alcohol use: No   Drug use: No   Sexual activity: Not on file  Other Topics Concern   Not on file  Social History Narrative   Not on file   Social Determinants of Health   Financial Resource Strain: Not on file  Food Insecurity: Not on file  Transportation Needs: Not on file  Physical  Activity: Not on file  Stress: Not on file  Social Connections: Not on file  Intimate Partner Violence: Not on file    Outpatient Medications Prior to Visit  Medication Sig Dispense Refill   benzonatate (TESSALON) 100 MG capsule Take 1 capsule (100 mg total) by mouth 3 (three) times daily as needed for cough. 30 capsule 0   Calcium Carb-Cholecalciferol (CALCIUM 600 + D PO) Take by mouth daily.     cetirizine (ZYRTEC) 10 MG tablet Take 10 mg by mouth daily.     Cholecalciferol (VITAMIN D3) 2000 units TABS Take by mouth daily.     Multiple Vitamins-Calcium (ONE-A-DAY WOMENS PO) Take by mouth daily.     pseudoephedrine (SUDAFED) 30 MG tablet Take 30 mg by mouth daily.     vitamin C (ASCORBIC ACID) 500 MG tablet Take 500 mg by mouth daily.     atenolol (TENORMIN) 25 MG  tablet TAKE 0.5 TABLETS (12.5 MG TOTAL) BY MOUTH DAILY. 45 tablet 0   rosuvastatin (CRESTOR) 10 MG tablet TAKE 1 TABLET (10 MG TOTAL) BY MOUTH DAILY. 90 tablet 0   Facility-Administered Medications Prior to Visit  Medication Dose Route Frequency Provider Last Rate Last Admin   0.9 %  sodium chloride infusion  500 mL Intravenous Once Armbruster, Carlota Raspberry, MD        No Known Allergies  ROS Review of Systems  Constitutional:  Negative for activity change, appetite change, fatigue, fever and unexpected weight change.  HENT:  Negative for ear pain, hearing loss, sore throat and trouble swallowing.   Eyes:  Negative for visual disturbance.  Respiratory:  Negative for cough and shortness of breath.   Cardiovascular:  Negative for chest pain and palpitations.  Gastrointestinal:  Negative for abdominal pain, blood in stool, constipation and diarrhea.  Genitourinary:  Negative for dysuria and hematuria.  Musculoskeletal:  Positive for back pain. Negative for arthralgias and myalgias.  Skin:  Negative for rash.  Neurological:  Negative for dizziness, syncope and headaches.  Hematological:  Negative for adenopathy.  Psychiatric/Behavioral:  Negative for confusion and dysphoric mood.      Objective:    Physical Exam Constitutional:      Appearance: She is well-developed.  HENT:     Head: Normocephalic and atraumatic.  Eyes:     Pupils: Pupils are equal, round, and reactive to light.  Neck:     Thyroid: No thyromegaly.  Cardiovascular:     Rate and Rhythm: Normal rate and regular rhythm.     Heart sounds: Normal heart sounds. No murmur heard. Pulmonary:     Effort: No respiratory distress.     Breath sounds: Normal breath sounds. No wheezing or rales.  Abdominal:     General: Bowel sounds are normal. There is no distension.     Palpations: Abdomen is soft. There is no mass.     Tenderness: There is no abdominal tenderness. There is no guarding or rebound.  Musculoskeletal:         General: Normal range of motion.     Cervical back: Normal range of motion and neck supple.     Right lower leg: No edema.     Left lower leg: No edema.  Lymphadenopathy:     Cervical: No cervical adenopathy.  Skin:    Findings: No rash.  Neurological:     Mental Status: She is alert and oriented to person, place, and time.     Cranial Nerves: No cranial nerve deficit.  Deep Tendon Reflexes: Reflexes normal.  Psychiatric:        Behavior: Behavior normal.        Thought Content: Thought content normal.        Judgment: Judgment normal.    BP 124/70 (BP Location: Left Arm, Patient Position: Sitting, Cuff Size: Normal)   Pulse 91   Temp 98.2 F (36.8 C) (Oral)   Ht 5\' 3"  (1.6 m)   Wt 140 lb (63.5 kg)   LMP 09/05/2010   SpO2 98%   BMI 24.80 kg/m  Wt Readings from Last 3 Encounters:  02/07/21 140 lb (63.5 kg)  10/19/20 139 lb 14.4 oz (63.5 kg)  10/16/20 140 lb (63.5 kg)     Health Maintenance Due  Topic Date Due   HIV Screening  Never done   COVID-19 Vaccine (3 - Booster for Pfizer series) 02/06/2020   PAP SMEAR-Modifier  03/31/2021    There are no preventive care reminders to display for this patient.  Lab Results  Component Value Date   TSH 2.21 11/10/2019   Lab Results  Component Value Date   WBC 5.7 10/16/2020   HGB 12.3 10/16/2020   HCT 36.1 10/16/2020   MCV 89.1 10/16/2020   PLT 271 10/16/2020   Lab Results  Component Value Date   NA 139 10/16/2020   K 3.9 10/16/2020   CO2 24 10/16/2020   GLUCOSE 103 (H) 10/16/2020   BUN 25 (H) 10/16/2020   CREATININE 0.88 10/16/2020   BILITOT 0.4 01/06/2020   ALKPHOS 73 01/06/2020   AST 15 01/06/2020   ALT 15 01/06/2020   PROT 6.9 01/06/2020   ALBUMIN 4.3 01/06/2020   CALCIUM 9.1 10/16/2020   ANIONGAP 8 10/16/2020   GFR 73.67 11/10/2019   Lab Results  Component Value Date   CHOL 179 01/06/2020   Lab Results  Component Value Date   HDL 39.60 01/06/2020   No results found for: [facility] Lab Results   Component Value Date   TRIG 241.0 (H) 01/06/2020   Lab Results  Component Value Date   CHOLHDL 5 01/06/2020   No results found for: HGBA1C    Assessment & Plan:   Problem List Items Addressed This Visit   None Visit Diagnoses     Physical exam    -  Primary   Relevant Orders   Basic metabolic panel   Lipid panel   CBC with Differential/Platelet   TSH   Hepatic function panel   Intermittent palpitations       Relevant Medications   atenolol (TENORMIN) 25 MG tablet   Need for Tdap vaccination       Relevant Orders   Tdap vaccine greater than or equal to 7yo IM (Completed)     Generally healthy 60 year old female.  She is had some chronic left SI joint pains not improved with recent chiropractic care.  We discussed the following items  -Set up trial of physical therapy for left SI joint pain and if not improving in a few weeks consider sports medicine referral -Tdap given -Obtain screening labs as above -Refilled her Crestor and atenolol for 1 year -Continue regular weightbearing exercise -Continue annual flu vaccine -She plans to continue GYN follow-up regarding her mammograms and Pap smears.  Meds ordered this encounter  Medications   atenolol (TENORMIN) 25 MG tablet    Sig: Take 0.5 tablets (12.5 mg total) by mouth daily.    Dispense:  45 tablet    Refill:  3   rosuvastatin (CRESTOR)  10 MG tablet    Sig: Take 1 tablet (10 mg total) by mouth daily.    Dispense:  90 tablet    Refill:  3    Follow-up: No follow-ups on file.    Carolann Littler, MD

## 2021-02-15 ENCOUNTER — Ambulatory Visit: Payer: 59 | Attending: Family Medicine | Admitting: Physical Therapy

## 2021-02-15 ENCOUNTER — Encounter: Payer: Self-pay | Admitting: Physical Therapy

## 2021-02-15 ENCOUNTER — Other Ambulatory Visit: Payer: Self-pay

## 2021-02-15 DIAGNOSIS — M5442 Lumbago with sciatica, left side: Secondary | ICD-10-CM | POA: Diagnosis not present

## 2021-02-15 DIAGNOSIS — M62838 Other muscle spasm: Secondary | ICD-10-CM | POA: Diagnosis not present

## 2021-02-15 DIAGNOSIS — G8929 Other chronic pain: Secondary | ICD-10-CM | POA: Diagnosis not present

## 2021-02-15 NOTE — Patient Instructions (Signed)
Access Code: [email or web address] URL: [email or web address] Date: 02/15/2021 Prepared by: Almyra Free  Exercises Supine Quadriceps Stretch with Strap on Table - 2 x daily - 7 x weekly - 1 sets - 3 reps - 30-60 sec hold Supine Sciatic Nerve Glide - 1 x daily - 7 x weekly - 1 sets - 10 reps - 5 sec hold Prone Press Up - 3-4 x daily - 7 x weekly - 1-3 sets - 10 reps Gastroc Stretch on Wall - 2 x daily - 7 x weekly - 1 sets - 3 reps - 30-60 sec hold

## 2021-02-15 NOTE — Therapy (Signed)
[facility] [address], [place], [place], [address] Phone: [phone]   Fax:  [phone]  Physical Therapy Evaluation  Patient Details  Name: Barbara Carlson MRN: [id number] Date of Birth: 1961-06-26 Referring Provider (PT): Lawson Fiscal MD   Encounter Date: 02/15/2021   PT End of Session - 02/15/21 1011     Visit Number 1    Date for PT Re-Evaluation 04/12/21    Authorization Type UMR    PT Start Time 1011    PT Stop Time 1056    PT Time Calculation (min) 45 min    Activity Tolerance Patient tolerated treatment well    Behavior During Therapy [facility] for tasks assessed/performed             Past Medical History:  Diagnosis Date   Asthma    as a child   Cancer ([place])    basal cell   Costochondritis, acute    couple years ago   Hyperlipidemia    diet controlled   Other dyspnea and respiratory abnormality    Palpitations    Post-operative nausea and vomiting    Tachycardia, unspecified     Past Surgical History:  Procedure Laterality Date   BASAL CELL CARCINOMA EXCISION  2004   right  abd   GANGLION CYST EXCISION  2015   right hand   REFRACTIVE SURGERY  2003   Bil   TONSILLECTOMY Bilateral 1987    There were no vitals filed for this visit.    Subjective Assessment - 02/15/21 1013     Subjective Patient with h/o left LBP and is familiar to this clinic. In February, patient crossed her right leg over left and sneezed. Pain in right side initially then moved to left. She saw chiropractor but it has not improved. She is getting regular massages. Feels swollen on left side. Sitting and driving are the worst and she now feels pain into left lower lateral leg. She is a Marine scientist at [employer].    Pertinent History bulging disc L5/S1    How long can you sit comfortably? 20 min    Diagnostic tests xrays    Patient Stated Goals decrease pain    Currently in Pain? Yes    Pain Score 7     Pain Location Neck     Pain Orientation Left    Pain Descriptors / Indicators Sharp;Throbbing;Shooting    Pain Type Chronic pain    Pain Radiating Towards post thigh to ankle    Pain Onset More than a month ago    Pain Frequency Intermittent    Aggravating Factors  sitting and driving    Pain Relieving Factors stand                [facility] PT Assessment - 02/15/21 0001       Assessment   Medical Diagnosis chronic left SIJ pain    Referring Provider (PT) Lawson Fiscal MD    Onset Date/Surgical Date 09/13/20    Hand Dominance Right    Next MD Visit no    Prior Therapy yes      Precautions   Precautions None      Restrictions   Weight Bearing Restrictions No      Balance Screen   Has the patient fallen in the past 6 months No    Has the patient had a decrease in activity level because of a fear of falling?  No    Is the patient reluctant  to leave their home because of a fear of falling?  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    Leisure walks daily; cleaning, flower garden      Observation/Other Assessments   Focus on Therapeutic Outcomes (FOTO)  FS = 51      Posture/Postural Control   Posture Comments left mild scoliosis      ROM / Strength   AROM / PROM / Strength AROM;Strength      AROM   Overall AROM Comments Lumbar WNL except left SB limited 75% by pain      Strength   Overall Strength Comments left lumbar stabilizers weak with MMT of hip ext      Palpation   Spinal mobility Lt L2/3 painful, Stiff with UPA mobs bil,  Rt> Lt    Palpation comment left ITB, QL/lumbar, gluteals tender and with TPs      Special Tests   Other special tests neg lumbar special tests                        Objective measurements completed on  examination: See above findings.                 PT Short Term Goals - 02/15/21 1114       PT SHORT TERM GOAL #1   Title ind with initial HEP    Time 4    Period Weeks    Status New    Target Date 03/15/21      PT SHORT TERM GOAL #2   Title patient able to consistently centralize LE sx with HEP    Time 4    Period Weeks    Status New               PT Long Term Goals - 02/15/21 1112       PT LONG TERM GOAL #1   Title ind with advanced HEP    Time 8    Period Weeks    Status New      PT LONG TERM GOAL #2   Title FS score >= 66    Baseline baseline 51    Time 8    Period Weeks    Status New      PT LONG TERM GOAL #3   Title Patient able to sit and drive for one hour without pain.    Time 8    Period Weeks    Status New      PT LONG TERM GOAL #4   Title pt to demo good stabilization in the lumbar spine with functional ther ex and MMT of hip extensors.    Baseline -    Time 8    Period Weeks    Status New      PT LONG TERM GOAL #5   Title Pt will demonstrate full and pain free lumbar AROM for return to prior level of activity    Baseline -    Time 8    Period Weeks    Status New                    Plan - 02/15/21 1100  Clinical Impression Statement Patient with presents with returning c/o of left-sided low back and radicular pain starting in February 2022. She has decreased lumbar spine mobility and decreased left lateral SB with pain. She has pain primarily with sitting and driving and relief with standing. She reports that she has a bulging disc at L5/S1 and her signs and sx are consistent with this. She has 5/5 strength in bil hips and knees but demonstrates weakness in her left lumbar stabilizers with MMT. She has tight bil hip flexors, postive sciatic nerve tension on the left and a tight and tender left ITB. Patient will benefit from PT to address these deficits.    Personal Factors and Comorbidities Past/Current Experience     Examination-Activity Limitations Sit    Stability/Clinical Decision Making Stable/Uncomplicated    Clinical Decision Making Low    Rehab Potential Excellent    PT Frequency 2x / week    PT Duration 8 weeks    PT Treatment/Interventions ADLs/Self Care Home Management;Cryotherapy;Electrical Stimulation;Moist Heat;Traction;Neuromuscular re-education;Therapeutic exercise;Therapeutic activities;Patient/family education;Manual techniques;Dry needling    PT Next Visit Plan review HEP, extension biased TE, lumbar stabilization and functional strengthening for low back, DN to lumbar spine and left hip/ITB    PT Home Exercise Plan [email or web address]    Consulted and Agree with Plan of Care Patient             Patient will benefit from skilled therapeutic intervention in order to improve the following deficits and impairments:  Decreased range of motion, Pain, Increased muscle spasms, Hypomobility, Impaired flexibility, Postural dysfunction, Decreased strength  Visit Diagnosis: Chronic left-sided low back pain with left-sided sciatica - Plan: PT plan of care cert/re-cert  Other muscle spasm - Plan: PT plan of care cert/re-cert     Problem List Patient Active Problem List   Diagnosis Date Noted   Midline low back pain without sciatica 04/11/2018   HYPERLIPIDEMIA-MIXED 09/25/2010   TACHYCARDIA 11/19/2008   PALPITATIONS 11/19/2008   Madelyn Flavors PT 02/15/2021, 11:22 AM   [facility] [address], [place], [place], [address] Phone: [phone]   Fax:  [phone]  Name: AYRIANA WIX MRN: [id number] Date of Birth: 13-Oct-1960

## 2021-02-21 ENCOUNTER — Encounter: Payer: Self-pay | Admitting: Physical Therapy

## 2021-02-21 ENCOUNTER — Ambulatory Visit: Payer: 59 | Admitting: Physical Therapy

## 2021-02-21 ENCOUNTER — Other Ambulatory Visit: Payer: Self-pay

## 2021-02-21 DIAGNOSIS — M62838 Other muscle spasm: Secondary | ICD-10-CM | POA: Diagnosis not present

## 2021-02-21 DIAGNOSIS — G8929 Other chronic pain: Secondary | ICD-10-CM | POA: Diagnosis not present

## 2021-02-21 DIAGNOSIS — M5442 Lumbago with sciatica, left side: Secondary | ICD-10-CM | POA: Diagnosis not present

## 2021-02-21 NOTE — Therapy (Signed)
[facility] [address], [place], [place], [address] Phone: [phone]   Fax:  [phone]  Physical Therapy Treatment  Patient Details  Name: Barbara Carlson MRN: [id number] Date of Birth: Mar 02, 1961 Referring Provider (PT): Lawson Fiscal MD   Encounter Date: 02/21/2021   PT End of Session - 02/21/21 1403     Visit Number 2    Date for PT Re-Evaluation 04/12/21    Authorization Type UMR, medical review after 25 visits    Authorization - Visit Number 2    Authorization - Number of Visits 25    PT Start Time 5809    PT Stop Time 1446    PT Time Calculation (min) 41 min    Activity Tolerance Patient tolerated treatment well    Behavior During Therapy [facility] for tasks assessed/performed             Past Medical History:  Diagnosis Date   Asthma    as a child   Cancer ([place])    basal cell   Costochondritis, acute    couple years ago   Hyperlipidemia    diet controlled   Other dyspnea and respiratory abnormality    Palpitations    Post-operative nausea and vomiting    Tachycardia, unspecified     Past Surgical History:  Procedure Laterality Date   BASAL CELL CARCINOMA EXCISION  2004   right  abd   GANGLION CYST EXCISION  2015   right hand   REFRACTIVE SURGERY  2003   Bil   TONSILLECTOMY Bilateral 1987    There were no vitals filed for this visit.   Subjective Assessment - 02/21/21 1404     Subjective This is a better week than last.  I had to ride in a car on Fri x 2 hours (1 each way) and I was miserable all weekend.  Sitting is just terrible for me.    Pertinent History bulging disc L5/S1    How long can you sit comfortably? 20 min    Diagnostic tests xrays    Patient Stated Goals decrease pain    Currently in Pain? Yes    Pain Score 2     Pain Location Pelvis    Pain Orientation Left    Pain Descriptors / Indicators Throbbing    Pain Radiating Towards no leg pain today    Pain Onset  More than a month ago    Pain Frequency Intermittent                               [facility] Adult PT Treatment/Exercise - 02/21/21 0001       Exercises   Exercises Knee/Hip;Lumbar      Lumbar Exercises: Standing   Other Standing Lumbar Exercises lumbar extension 5x5" (added to HEP)      Knee/Hip Exercises: Stretches   Sports administrator Both;1 rep;30 seconds    Quad Stretch Limitations standing (changed from supine leg off table due to Pt pain)    Hip Flexor Stretch Left;30 seconds;2 reps    Hip Flexor Stretch Limitations standing      Manual Therapy   Manual Therapy Manual Traction;Joint mobilization    Joint Mobilization lumbar PAs and UPAs on Lt Gr II/III L1-L5, T/L jxn, S/L Lt lumbar PAVMs T12-L3 Gr II/III    Manual Traction prone sacral distraction Gr II/III x 2'  Trigger Point Dry Needling - 02/21/21 0001     Consent Given? Yes    Education Handout Provided Previously provided    Muscles Treated Back/Hip Gluteus minimus;Gluteus medius;Piriformis;Lumbar multifidi    Other Dry Needling bil lumbar, Lt hip    Gluteus Minimus Response Twitch response elicited;Palpable increased muscle length    Gluteus Medius Response Twitch response elicited;Palpable increased muscle length    Piriformis Response Twitch response elicited;Palpable increased muscle length    Lumbar multifidi Response Twitch response elicited;Palpable increased muscle length                    PT Short Term Goals - 02/15/21 1114       PT SHORT TERM GOAL #1   Title ind with initial HEP    Time 4    Period Weeks    Status New    Target Date 03/15/21      PT SHORT TERM GOAL #2   Title patient able to consistently centralize LE sx with HEP    Time 4    Period Weeks    Status New               PT Long Term Goals - 02/15/21 1112       PT LONG TERM GOAL #1   Title ind with advanced HEP    Time 8    Period Weeks    Status New      PT LONG TERM GOAL #2    Title FS score >= 66    Baseline baseline 51    Time 8    Period Weeks    Status New      PT LONG TERM GOAL #3   Title Patient able to sit and drive for one hour without pain.    Time 8    Period Weeks    Status New      PT LONG TERM GOAL #4   Title pt to demo good stabilization in the lumbar spine with functional ther ex and MMT of hip extensors.    Baseline -    Time 8    Period Weeks    Status New      PT LONG TERM GOAL #5   Title Pt will demonstrate full and pain free lumbar AROM for return to prior level of activity    Baseline -    Time 8    Period Weeks    Status New                   Plan - 02/21/21 1622     Clinical Impression Statement Pt returns for first follow up since initial visit.  She arrived without LE symptoms and low grade pain in lumbar region.  She continues to have increased pain with sitting and had to sit 2x 1 hour in car last Fri and had a bad flare up of pain which lasted most of the weekend.  She reported back pain with supine quad stretch off edge of bed from initial HEP so PT modified to standing quad stretch with good tolerance.  PT also added standing hip flexor stretch and standing lumbar backbends as an option to do for relief throughout day when she is unable to lay on stomach for prone press ups.  PT performed manual techniques including DN, manual traction and joint mobs to address limitations in mobility of left lumbar region and hip with good release and improved mobility.  PT discussed DN  aftercare and gave handouts for updated HEP.  Continue along POC.    PT Frequency 2x / week    PT Duration 8 weeks    PT Treatment/Interventions ADLs/Self Care Home Management;Cryotherapy;Electrical Stimulation;Moist Heat;Traction;Neuromuscular re-education;Therapeutic exercise;Therapeutic activities;Patient/family education;Manual techniques;Dry needling    PT Next Visit Plan f/u on HEP updates and DN #1, did Pt get massage, prone manual  traction, intro deep core, neutral spine strengthening, extension bias for pain control, modalities as needed    PT Home Exercise Plan [email or web address]    Consulted and Agree with Plan of Care Patient             Patient will benefit from skilled therapeutic intervention in order to improve the following deficits and impairments:     Visit Diagnosis: Chronic left-sided low back pain with left-sided sciatica  Other muscle spasm     Problem List Patient Active Problem List   Diagnosis Date Noted   Midline low back pain without sciatica 04/11/2018   HYPERLIPIDEMIA-MIXED 09/25/2010   TACHYCARDIA 11/19/2008   PALPITATIONS 11/19/2008    Baruch Merl, PT 02/21/21 4:29 PM   [facility] [address] [place], [place], [address] Phone: [phone]   Fax:  [phone]  Name: MARYSOL WELLNITZ MRN: [id number] Date of Birth: 1960-10-16

## 2021-02-24 ENCOUNTER — Other Ambulatory Visit: Payer: Self-pay

## 2021-02-24 ENCOUNTER — Ambulatory Visit: Payer: 59 | Admitting: Physical Therapy

## 2021-02-24 ENCOUNTER — Encounter: Payer: Self-pay | Admitting: Physical Therapy

## 2021-02-24 DIAGNOSIS — M62838 Other muscle spasm: Secondary | ICD-10-CM | POA: Diagnosis not present

## 2021-02-24 DIAGNOSIS — M5442 Lumbago with sciatica, left side: Secondary | ICD-10-CM

## 2021-02-24 DIAGNOSIS — G8929 Other chronic pain: Secondary | ICD-10-CM

## 2021-02-24 NOTE — Therapy (Signed)
[facility] [address], [place], [place], [address] Phone: [phone]   Fax:  [phone]  Physical Therapy Treatment  Patient Details  Name: Barbara Carlson MRN: [id number] Date of Birth: August 09, 1961 Referring Provider (PT): Lawson Fiscal MD   Encounter Date: 02/24/2021   PT End of Session - 02/24/21 1146     Visit Number 3    Date for PT Re-Evaluation 04/12/21    Authorization Type UMR, medical review after 25 visits    Authorization - Visit Number 3    Authorization - Number of Visits 25    PT Start Time 1100    PT Stop Time 1856    PT Time Calculation (min) 45 min    Activity Tolerance Patient tolerated treatment well    Behavior During Therapy [facility] for tasks assessed/performed             Past Medical History:  Diagnosis Date   Asthma    as a child   Cancer ([place])    basal cell   Costochondritis, acute    couple years ago   Hyperlipidemia    diet controlled   Other dyspnea and respiratory abnormality    Palpitations    Post-operative nausea and vomiting    Tachycardia, unspecified     Past Surgical History:  Procedure Laterality Date   BASAL CELL CARCINOMA EXCISION  2004   right  abd   GANGLION CYST EXCISION  2015   right hand   REFRACTIVE SURGERY  2003   Bil   TONSILLECTOMY Bilateral 1987    There were no vitals filed for this visit.   Subjective Assessment - 02/24/21 1103     Subjective I have pain that feels different from before the DN.  I did work 12 hours yesterday and sitting is still painful.  The standing extensions feel really good.  I have pain again today Lt SI region to knee along back of thigh    Pertinent History bulging disc L5/S1    How long can you sit comfortably? 20 min    Diagnostic tests xrays    Patient Stated Goals decrease pain    Currently in Pain? Yes    Pain Score 5     Pain Location Pelvis    Pain Orientation Left    Pain Type Chronic pain    Pain  Radiating Towards posterior thigh to knee    Pain Onset More than a month ago    Pain Frequency Intermittent    Aggravating Factors  sitting/driving                               [facility] Adult PT Treatment/Exercise - 02/24/21 0001       Exercises   Exercises Shoulder;Lumbar;Knee/Hip      Knee/Hip Exercises: Stretches   Hip Flexor Stretch Left;30 seconds;2 reps    Hip Flexor Stretch Limitations standing    Gastroc Stretch Left;30 seconds      Knee/Hip Exercises: Standing   Hip Abduction Stengthening;Both;1 set;10 reps;Knee straight    Hip Extension Stengthening;Both;1 set;10 reps;Knee straight    Forward Step Up Both;1 set;10 reps;Step Height: 6"    Forward Step Up Limitations with contralateral hip ext    Functional Squat 10 reps    Functional Squat Limitations small range holding 5lb kbell    Walking with Sports Cord red band resisted bwd/fwd walking x 10 reps each  Shoulder Exercises: Standing   Other Standing Exercises 3-way raises standing on foam 2lb bil UE dumbbells x 5 rounds, then diag x 10 in SLS with toe support of contralateral leg      Modalities   Modalities Cryotherapy      Cryotherapy   Number Minutes Cryotherapy 10 Minutes    Cryotherapy Location Lumbar Spine   prone   Type of Cryotherapy Ice pack      Manual Therapy   Manual Therapy Joint mobilization;Manual Traction    Joint Mobilization Lt sacral anterior rot Gr II/III    Manual Traction prone sacral distraction Gr II/III x 2'                      PT Short Term Goals - 02/15/21 1114       PT SHORT TERM GOAL #1   Title ind with initial HEP    Time 4    Period Weeks    Status New    Target Date 03/15/21      PT SHORT TERM GOAL #2   Title patient able to consistently centralize LE sx with HEP    Time 4    Period Weeks    Status New               PT Long Term Goals - 02/15/21 1112       PT LONG TERM GOAL #1   Title ind with advanced HEP    Time 8     Period Weeks    Status New      PT LONG TERM GOAL #2   Title FS score >= 66    Baseline baseline 51    Time 8    Period Weeks    Status New      PT LONG TERM GOAL #3   Title Patient able to sit and drive for one hour without pain.    Time 8    Period Weeks    Status New      PT LONG TERM GOAL #4   Title pt to demo good stabilization in the lumbar spine with functional ther ex and MMT of hip extensors.    Baseline -    Time 8    Period Weeks    Status New      PT LONG TERM GOAL #5   Title Pt will demonstrate full and pain free lumbar AROM for return to prior level of activity    Baseline -    Time 8    Period Weeks    Status New                   Plan - 02/24/21 1151     Clinical Impression Statement Pt reported good release of lumbar spine after DN but with ongoing local pain around Lt SI joint which refers down into poterior thigh.  Sitting continues to be very aggravating.  Ice, heat and HEP especially extension based ther ex have been helpful in managing pain.  PT addressed sacral mobs and performed prone manual traction followed by intro of functional strength for core activation and hip strength in neutral spine.  Pt needed ice end of session for Lt SI joint aching pain.  HEP progressed to reflect ther ex from today.    Rehab Potential Excellent    PT Frequency 2x / week    PT Duration 8 weeks    PT Treatment/Interventions ADLs/Self Care Home Management;Cryotherapy;Electrical Stimulation;Moist Heat;Traction;Neuromuscular  re-education;Therapeutic exercise;Therapeutic activities;Patient/family education;Manual techniques;Dry needling    PT Next Visit Plan repeat DN lumbar and hip, review HEP, continue lumbopelvic strength and core stabilization    PT Home Exercise Plan [email or web address]    Consulted and Agree with Plan of Care Patient             Patient will benefit from skilled therapeutic intervention in order to improve the following deficits and  impairments:     Visit Diagnosis: Chronic left-sided low back pain with left-sided sciatica  Other muscle spasm     Problem List Patient Active Problem List   Diagnosis Date Noted   Midline low back pain without sciatica 04/11/2018   HYPERLIPIDEMIA-MIXED 09/25/2010   TACHYCARDIA 11/19/2008   PALPITATIONS 11/19/2008    Baruch Merl, PT 02/24/21 11:55 AM   [facility] [address], [place], [place], [address] Phone: [phone]   Fax:  [phone]  Name: Barbara Carlson MRN: [id number] Date of Birth: 08/19/1960

## 2021-02-28 ENCOUNTER — Ambulatory Visit: Payer: 59 | Admitting: Physical Therapy

## 2021-02-28 ENCOUNTER — Other Ambulatory Visit: Payer: Self-pay

## 2021-02-28 ENCOUNTER — Encounter: Payer: Self-pay | Admitting: Physical Therapy

## 2021-02-28 DIAGNOSIS — M5442 Lumbago with sciatica, left side: Secondary | ICD-10-CM | POA: Diagnosis not present

## 2021-02-28 DIAGNOSIS — M62838 Other muscle spasm: Secondary | ICD-10-CM | POA: Diagnosis not present

## 2021-02-28 DIAGNOSIS — G8929 Other chronic pain: Secondary | ICD-10-CM | POA: Diagnosis not present

## 2021-02-28 NOTE — Therapy (Signed)
[facility] [address], [place], [place], [address] Phone: [phone]   Fax:  [phone]  Physical Therapy Treatment  Patient Details  Name: Barbara Carlson MRN: [id number] Date of Birth: 27-Aug-1960 Referring Provider (PT): Lawson Fiscal MD   Encounter Date: 02/28/2021   PT End of Session - 02/28/21 1228     Visit Number 4    Date for PT Re-Evaluation 04/12/21    Authorization Type UMR, medical review after 25 visits    Authorization - Visit Number 4    Authorization - Number of Visits 25    PT Start Time 5427    PT Stop Time 1322    PT Time Calculation (min) 52 min    Activity Tolerance Patient tolerated treatment well    Behavior During Therapy [facility] for tasks assessed/performed             Past Medical History:  Diagnosis Date   Asthma    as a child   Cancer ([place])    basal cell   Costochondritis, acute    couple years ago   Hyperlipidemia    diet controlled   Other dyspnea and respiratory abnormality    Palpitations    Post-operative nausea and vomiting    Tachycardia, unspecified     Past Surgical History:  Procedure Laterality Date   BASAL CELL CARCINOMA EXCISION  2004   right  abd   GANGLION CYST EXCISION  2015   right hand   REFRACTIVE SURGERY  2003   Bil   TONSILLECTOMY Bilateral 1987    There were no vitals filed for this visit.   Subjective Assessment - 02/28/21 1229     Subjective I'm still feeling the Lt SI joint area and it is going into the thigh/knee.    Pertinent History bulging disc L5/S1    How long can you sit comfortably? 20 min    Diagnostic tests xrays    Patient Stated Goals decrease pain    Currently in Pain? Yes    Pain Score 5     Pain Location Pelvis    Pain Orientation Left    Pain Descriptors / Indicators Throbbing    Pain Type Chronic pain    Pain Onset More than a month ago    Pain Frequency Intermittent    Aggravating Factors  sitting    Pain  Relieving Factors standing, extension based exercise                               [facility] Adult PT Treatment/Exercise - 02/28/21 0001       Lumbar Exercises: Standing   Other Standing Lumbar Exercises lumbar ext in counter plank position, let pelvis sag through (for standing desk option for pain relief at work)      Modalities   Modalities Moist Heat      Cryotherapy   Number Minutes Cryotherapy 10 Minutes    Cryotherapy Location Hip;Lumbar Spine   Lt     Manual Therapy   Manual Therapy Joint mobilization;Soft tissue mobilization    Manual therapy comments skilled palpation for DN    Joint Mobilization thoracic UPAs and rib springing on Lt T4-T10 Gr II/III    Soft tissue mobilization Lt obliques, thoracic paraspinals, QL, glut med    Manual Traction prone sacral distraction Gr II/III x 5'              Trigger  Point Dry Needling - 02/28/21 0001     Consent Given? Yes    Education Handout Provided Previously provided    Muscles Treated Back/Hip Gluteus medius;Piriformis;Quadratus lumborum;Lumbar multifidi;Gluteus maximus    Dry Needling Comments Lt    Gluteus Medius Response Twitch response elicited;Palpable increased muscle length    Gluteus Maximus Response Palpable increased muscle length    Piriformis Response Twitch response elicited;Palpable increased muscle length    Lumbar multifidi Response Twitch response elicited;Palpable increased muscle length    Quadratus Lumborum Response Palpable increased muscle length                    PT Short Term Goals - 02/15/21 1114       PT SHORT TERM GOAL #1   Title ind with initial HEP    Time 4    Period Weeks    Status New    Target Date 03/15/21      PT SHORT TERM GOAL #2   Title patient able to consistently centralize LE sx with HEP    Time 4    Period Weeks    Status New               PT Long Term Goals - 02/15/21 1112       PT LONG TERM GOAL #1   Title ind with advanced  HEP    Time 8    Period Weeks    Status New      PT LONG TERM GOAL #2   Title FS score >= 66    Baseline baseline 51    Time 8    Period Weeks    Status New      PT LONG TERM GOAL #3   Title Patient able to sit and drive for one hour without pain.    Time 8    Period Weeks    Status New      PT LONG TERM GOAL #4   Title pt to demo good stabilization in the lumbar spine with functional ther ex and MMT of hip extensors.    Baseline -    Time 8    Period Weeks    Status New      PT LONG TERM GOAL #5   Title Pt will demonstrate full and pain free lumbar AROM for return to prior level of activity    Baseline -    Time 8    Period Weeks    Status New                   Plan - 02/28/21 1318     Clinical Impression Statement Pt continues to have central low back pain that aches and spreads Lt>Rt with extension of pain into posterolateral thigh and knee.  PT repeated DN today with most signif twitch and release L4/5 multifidus and glut med.  Pt reported Lt LE pain with DN to glut med suggesting some leg pain may be derived from TP of lateral hip.  Good elongation and release of Lt QL today which was DN for the first time today.  Pt has ongoing signif thoracic paraspinal, oblique and QL tension with limited UPAs and rib springing on Lt which responded well today to [facility] and joint mobs.  Pt sore end of session but with relief and improved mobility; heat used for soreness end of session.    Stability/Clinical Decision Making Stable/Uncomplicated    Rehab Potential Excellent    PT Frequency  2x / week    PT Duration 8 weeks    PT Treatment/Interventions ADLs/Self Care Home Management;Cryotherapy;Electrical Stimulation;Moist Heat;Traction;Neuromuscular re-education;Therapeutic exercise;Therapeutic activities;Patient/family education;Manual techniques;Dry needling    PT Next Visit Plan continue thoracic mobs and STM, try adding cat/cow and open book, review HEP and progress as needed     PT Home Exercise Plan [id number]    Consulted and Agree with Plan of Care Patient             Patient will benefit from skilled therapeutic intervention in order to improve the following deficits and impairments:     Visit Diagnosis: Chronic left-sided low back pain with left-sided sciatica  Other muscle spasm     Problem List Patient Active Problem List   Diagnosis Date Noted   Midline low back pain without sciatica 04/11/2018   HYPERLIPIDEMIA-MIXED 09/25/2010   TACHYCARDIA 11/19/2008   PALPITATIONS 11/19/2008    Trayvond Viets, PT 02/28/21 1:24 PM   [facility] [address] [place], [place], [address] Phone: [phone]   Fax:  [phone]  Name: Barbara Carlson MRN: [id number] Date of Birth: 03/08/1961

## 2021-03-03 ENCOUNTER — Ambulatory Visit: Payer: 59 | Admitting: Physical Therapy

## 2021-03-03 ENCOUNTER — Encounter: Payer: Self-pay | Admitting: Physical Therapy

## 2021-03-03 ENCOUNTER — Other Ambulatory Visit: Payer: Self-pay

## 2021-03-03 DIAGNOSIS — M62838 Other muscle spasm: Secondary | ICD-10-CM | POA: Diagnosis not present

## 2021-03-03 DIAGNOSIS — G8929 Other chronic pain: Secondary | ICD-10-CM

## 2021-03-03 DIAGNOSIS — M5442 Lumbago with sciatica, left side: Secondary | ICD-10-CM | POA: Diagnosis not present

## 2021-03-03 NOTE — Therapy (Signed)
[facility] [address], [place], [place], [address] Phone: [phone]   Fax:  [phone]  Physical Therapy Treatment  Patient Details  Name: Barbara Carlson MRN: [id number] Date of Birth: 06-04-1961 Referring Provider (PT): Lawson Fiscal MD   Encounter Date: 03/03/2021   PT End of Session - 03/03/21 1120     Visit Number 5    Date for PT Re-Evaluation 04/12/21    Authorization Type UMR, medical review after 25 visits    Authorization - Visit Number 5    Authorization - Number of Visits 25    PT Start Time [id number]   PT started late   PT Stop Time 1117    PT Time Calculation (min) 47 min    Activity Tolerance Patient tolerated treatment well    Behavior During Therapy [facility] for tasks assessed/performed             Past Medical History:  Diagnosis Date   Asthma    as a child   Cancer ([place])    basal cell   Costochondritis, acute    couple years ago   Hyperlipidemia    diet controlled   Other dyspnea and respiratory abnormality    Palpitations    Post-operative nausea and vomiting    Tachycardia, unspecified     Past Surgical History:  Procedure Laterality Date   BASAL CELL CARCINOMA EXCISION  2004   right  abd   GANGLION CYST EXCISION  2015   right hand   REFRACTIVE SURGERY  2003   Bil   TONSILLECTOMY Bilateral 1987    There were no vitals filed for this visit.   Subjective Assessment - 03/03/21 1030     Subjective I was sore following the DN for a few days but then felt better.  Last night I made the mistake of propping on my side in bed and had increased pain again though.    Pertinent History bulging disc L5/S1    How long can you sit comfortably? 20 min    Diagnostic tests xrays    Patient Stated Goals decrease pain    Currently in Pain? Yes    Pain Score 3     Pain Location Back    Pain Orientation Left    Aggravating Factors  sitting    Pain Relieving Factors stand, extension  based exercise                               [facility] Adult PT Treatment/Exercise - 03/03/21 0001       Neuro Re-ed    Neuro Re-ed Details  prone TA 3x10 sec VC to hover grape in belly button, breathwork cueing for sequencing of activaiton and maintenance of core without holding breath, add on small range bil knee flexion with TC for lumbar multifidi 2x5 with TA co-contraction      Knee/Hip Exercises: Stretches   Sports administrator Left;1 rep;30 seconds    Quad Stretch Limitations standing    Hip Flexor Stretch Left;30 seconds    Hip Flexor Stretch Limitations dynamic stretch Rt foot on 2nd step, trunk slight extension, rocking into Rt knee flexion for dynamic lumbar ext and Lt hip flexor stretch      Modalities   Modalities Moist Heat      Moist Heat Therapy   Number Minutes Moist Heat 5 Minutes   prone   Moist Heat Location Lumbar Spine  Manual Therapy   Manual Therapy Joint mobilization;Soft tissue mobilization;Myofascial release    Joint Mobilization thoracic UPAs and rib springing on Lt T4-T10 Gr II/III    Soft tissue mobilization Lt thoracic paraspinals, Rt obliques    Myofascial Release suction cup to thoracodorsal fascia and bil oblique lines multi-angle, followed by manual medial to lateral and diagonal across body fascial elongation    Manual Traction prone sacral distraction Gr II/III x 5'                      PT Short Term Goals - 03/03/21 1139       PT SHORT TERM GOAL #1   Title ind with initial HEP    Status On-going      PT SHORT TERM GOAL #2   Title patient able to consistently centralize LE sx with HEP    Status Achieved               PT Long Term Goals - 02/15/21 1112       PT LONG TERM GOAL #1   Title ind with advanced HEP    Time 8    Period Weeks    Status New      PT LONG TERM GOAL #2   Title FS score >= 66    Baseline baseline 51    Time 8    Period Weeks    Status New      PT LONG TERM GOAL #3    Title Patient able to sit and drive for one hour without pain.    Time 8    Period Weeks    Status New      PT LONG TERM GOAL #4   Title pt to demo good stabilization in the lumbar spine with functional ther ex and MMT of hip extensors.    Baseline -    Time 8    Period Weeks    Status New      PT LONG TERM GOAL #5   Title Pt will demonstrate full and pain free lumbar AROM for return to prior level of activity    Baseline -    Time 8    Period Weeks    Status New                   Plan - 03/03/21 1122     Clinical Impression Statement Pt with ongoing Lt low back pain that extends into Lt posterior hip, worse with sitting and relieved in prone and with extension based movements.  Signs and symptoms continue to be consistent with discogenic pain.  She has gained partial relief with DN but continues to be very sore in lumbar and hip soft tissues.  She has ongoing restrictions in thoracic spine mobility, with Lt sided paraspinal increased tone and fascial restrictions along thoracic and lumbar regions.  PT used suction cup and manual techniques for improved fascial mobility which did appear to yield a more successful TA activation end of session from prone position.  Pt has been compliant with HEP and is able to perform without exacerbation of pain.  PT and Pt discussed safe body mechanics for household tasks today such as vaccuuming to limit flexion and outward reaching with UEs.  Pt reported relief end of session and will continue to benefit from skilled PT along POC.    PT Frequency 2x / week    PT Duration 8 weeks    PT Treatment/Interventions ADLs/Self Care  Home Management;Cryotherapy;Electrical Stimulation;Moist Heat;Traction;Neuromuscular re-education;Therapeutic exercise;Therapeutic activities;Patient/family education;Manual techniques;Dry needling    PT Next Visit Plan did myofascial release help last time, Rt foot on step dynamic Lt hip flexor stretch, try adding cat/cow and  open book, DN #3 if needed    PT Home Exercise Plan [id number]    Consulted and Agree with Plan of Care Patient             Patient will benefit from skilled therapeutic intervention in order to improve the following deficits and impairments:     Visit Diagnosis: Chronic left-sided low back pain with left-sided sciatica  Other muscle spasm     Problem List Patient Active Problem List   Diagnosis Date Noted   Midline low back pain without sciatica 04/11/2018   HYPERLIPIDEMIA-MIXED 09/25/2010   TACHYCARDIA 11/19/2008   PALPITATIONS 11/19/2008    Baruch Merl, PT 03/03/21 11:40 AM   [facility] [address], [place], [place], [address] Phone: [phone]   Fax:  [phone]  Name: Barbara Carlson MRN: [id number] Date of Birth: 08-Jan-1961

## 2021-03-07 ENCOUNTER — Other Ambulatory Visit: Payer: Self-pay

## 2021-03-07 ENCOUNTER — Encounter: Payer: Self-pay | Admitting: Physical Therapy

## 2021-03-07 ENCOUNTER — Ambulatory Visit: Payer: 59 | Admitting: Physical Therapy

## 2021-03-07 DIAGNOSIS — G8929 Other chronic pain: Secondary | ICD-10-CM

## 2021-03-07 DIAGNOSIS — M62838 Other muscle spasm: Secondary | ICD-10-CM | POA: Diagnosis not present

## 2021-03-07 DIAGNOSIS — M5442 Lumbago with sciatica, left side: Secondary | ICD-10-CM | POA: Diagnosis not present

## 2021-03-07 NOTE — Therapy (Signed)
[facility] [address], [place], [place], [address] Phone: [phone]   Fax:  [phone]  Physical Therapy Treatment  Patient Details  Name: Barbara Carlson MRN: [id number] Date of Birth: 1961/04/09 Referring Provider (PT): Lawson Fiscal MD   Encounter Date: 03/07/2021   PT End of Session - 03/07/21 1715     Visit Number 6    Date for PT Re-Evaluation 04/12/21    Authorization Type UMR, medical review after 25 visits    Authorization - Visit Number 6    Authorization - Number of Visits 25    PT Start Time [id number]    PT Stop Time 1610    PT Time Calculation (min) 35 min    Activity Tolerance Patient limited by pain    Behavior During Therapy [facility] for tasks assessed/performed             Past Medical History:  Diagnosis Date   Asthma    as a child   Cancer ([place])    basal cell   Costochondritis, acute    couple years ago   Hyperlipidemia    diet controlled   Other dyspnea and respiratory abnormality    Palpitations    Post-operative nausea and vomiting    Tachycardia, unspecified     Past Surgical History:  Procedure Laterality Date   BASAL CELL CARCINOMA EXCISION  2004   right  abd   GANGLION CYST EXCISION  2015   right hand   REFRACTIVE SURGERY  2003   Bil   TONSILLECTOMY Bilateral 1987    There were no vitals filed for this visit.   Subjective Assessment - 03/07/21 1535     Subjective I was flared up after the massage last time.  Then I sat in a very low chair in the toddler room at church over the weekend and had acute pain into Rt side (new, usually Lt side only).  I had to leave work early yesterday due to pain.  Yesterday pain was 8/10.  Rt side has calmed down somewhat but I am in a lot of pain on Lt side and it extends into the back of my knee. I just used ice at work.  I don't know how much we can actually do today.    Pertinent History bulging disc L5/S1    How long can you sit  comfortably? 20 min    Diagnostic tests xrays    Patient Stated Goals decrease pain    Currently in Pain? Yes    Pain Score 6     Pain Location Back    Pain Orientation Left    Pain Descriptors / Indicators Throbbing;Constant    Pain Type Acute pain;Chronic pain    Pain Radiating Towards posterior thigh to knee    Pain Onset More than a month ago    Pain Frequency Constant    Aggravating Factors  sitting    Pain Relieving Factors stand, ice, extension based exercise, TENS                [facility] PT Assessment - 03/07/21 0001       Observation/Other Assessments-Edema    Edema --   mild edema obseved and palpated overlying Lt SI joint     Palpation   Spinal mobility lumbar PAs limited L4-S1    SI assessment  left SI joint restricted    Palpation comment superficial tenderness Lt>Rt lumbar multifidi, Lt SI joint, gluteals, piriformis  [place] Adult PT Treatment/Exercise - 03/07/21 0001       Self-Care   Self-Care Other Self-Care Comments    Other Self-Care Comments  discussion of home TENS use parameters, plan for sending MD message about symptoms      Modalities   Modalities Electrical Stimulation      Electrical Stimulation   Electrical Stimulation Location lumbar   Pt in prone   Electrical Stimulation Action IFC    Electrical Stimulation Parameters 15 min    Electrical Stimulation Goals Pain      Manual Therapy   Manual Therapy Manual Traction;Joint mobilization    Joint Mobilization Gr I PAs L3-L5, Gr I/II sacral distraction    Manual Traction Gr II/III prone Lt LE traction 2x30 sec                      PT Short Term Goals - 03/03/21 1139       PT SHORT TERM GOAL #1   Title ind with initial HEP    Status On-going      PT SHORT TERM GOAL #2   Title patient able to consistently centralize LE sx with HEP    Status Achieved               PT Long Term Goals - 02/15/21 1112       PT LONG TERM  GOAL #1   Title ind with advanced HEP    Time 8    Period Weeks    Status New      PT LONG TERM GOAL #2   Title FS score >= 66    Baseline baseline 51    Time 8    Period Weeks    Status New      PT LONG TERM GOAL #3   Title Patient able to sit and drive for one hour without pain.    Time 8    Period Weeks    Status New      PT LONG TERM GOAL #4   Title pt to demo good stabilization in the lumbar spine with functional ther ex and MMT of hip extensors.    Baseline -    Time 8    Period Weeks    Status New      PT LONG TERM GOAL #5   Title Pt will demonstrate full and pain free lumbar AROM for return to prior level of activity    Baseline -    Time 8    Period Weeks    Status New                   Plan - 03/07/21 1719     Clinical Impression Statement Pt has had acute pain since propping in bed on her side last Thurs night which was further exacerbated by manual therapy performed on Fri by treating PT.  She then sat in a very low chair in a toddler room at church on Sunday and pain reached 8/10 and spread to Rt which was new.  Pt left work early yesterday due to pain.  She is able to stand or lay prone and has ongoing deep throbbing pain in Lt lumbar, SI joint and posterior thigh to knee.  She does have Lt SI joint restriction but also seems to have signs and symptoms consistent with discogenic pain.  She was more superficially tender today and PT and Pt decided not to do DN today.  She reported partial  relief with prone traction to Lt LE.  Pt had just used ice pack at work so PT applied e-stim only to lumbar region in prone x 15 min after light manual therapy.  PT messaged referring MD per Pt request to see if further work up is warranted at this point given her flare up and lack of consistent progress with PT to date.    PT Frequency 2x / week    PT Duration 8 weeks    PT Treatment/Interventions ADLs/Self Care Home Management;Cryotherapy;Electrical Stimulation;Moist  Heat;Traction;Neuromuscular re-education;Therapeutic exercise;Therapeutic activities;Patient/family education;Manual techniques;Dry needling    PT Next Visit Plan DN #3 if Pt ok with this and is less superficially tender, continue extension based ther ex and manual therapy as tol, neutral spine stabilization if feeling better    PT Home Exercise Plan [id number]    Consulted and Agree with Plan of Care Patient             Patient will benefit from skilled therapeutic intervention in order to improve the following deficits and impairments:     Visit Diagnosis: Chronic left-sided low back pain with left-sided sciatica  Other muscle spasm     Problem List Patient Active Problem List   Diagnosis Date Noted   Midline low back pain without sciatica 04/11/2018   HYPERLIPIDEMIA-MIXED 09/25/2010   TACHYCARDIA 11/19/2008   PALPITATIONS 11/19/2008    Baruch Merl, PT 03/07/21 5:28 PM   [facility] [address], [place], [place], [address] Phone: [phone]   Fax:  [phone]  Name: Barbara Carlson MRN: [id number] Date of Birth: Nov 24, 1960

## 2021-03-08 ENCOUNTER — Telehealth: Payer: Self-pay | Admitting: Family Medicine

## 2021-03-08 ENCOUNTER — Encounter: Payer: Self-pay | Admitting: Family Medicine

## 2021-03-08 DIAGNOSIS — M533 Sacrococcygeal disorders, not elsewhere classified: Secondary | ICD-10-CM

## 2021-03-08 NOTE — Telephone Encounter (Signed)
Patient has had persistent left SI pain and recently had physical therapy without improvement.  We had previously discussed with her at the time of her physical setting up sports medicine referral and she would like to proceed in that direction.  May eventually need MRI lumbar spine

## 2021-03-10 ENCOUNTER — Other Ambulatory Visit: Payer: Self-pay

## 2021-03-10 ENCOUNTER — Ambulatory Visit: Payer: 59 | Admitting: Physical Therapy

## 2021-03-10 DIAGNOSIS — G8929 Other chronic pain: Secondary | ICD-10-CM

## 2021-03-10 DIAGNOSIS — M62838 Other muscle spasm: Secondary | ICD-10-CM | POA: Diagnosis not present

## 2021-03-10 DIAGNOSIS — M5442 Lumbago with sciatica, left side: Secondary | ICD-10-CM | POA: Diagnosis not present

## 2021-03-10 NOTE — Patient Instructions (Signed)
Access Code: [email or web address] URL: [email or web address] Date: 03/10/2021 Prepared by: Ruben Im  Exercises Supine Sciatic Nerve Glide - 1 x daily - 7 x weekly - 1 sets - 10 reps - 5 sec hold Prone Press Up - 3-4 x daily - 7 x weekly - 1-3 sets - 10 reps Gastroc Stretch on Wall - 2 x daily - 7 x weekly - 1 sets - 3 reps - 30-60 sec hold Standing Quadriceps Stretch - 1 x daily - 7 x weekly - 1 sets - 3 reps - 30 hold Standing Lumbar Extension - 1 x daily - 7 x weekly - 1 sets - 5 reps - 5 hold Standing Hip Flexor Stretch - 1 x daily - 7 x weekly - 1 sets - 3 reps - 30 hold Cat Cow - 1 x daily - 7 x weekly - 1 sets - 10 reps Standing Hip Extension with Chair - 2 x daily - 7 x weekly - 1 sets - 10 reps Standing Hip Abduction with Anterior Support - 2 x daily - 7 x weekly - 1 sets - 10 reps Quarter Squat with Dumbbells - 1 x daily - 7 x weekly - 2 sets - 10 reps Standing Shoulder Flexion to 90 Degrees with Dumbbells - 1 x daily - 7 x weekly - 1 sets - 5 reps Shoulder Abduction with Dumbbells - Palms Down - 1 x daily - 7 x weekly - 1 sets - 5 reps Shoulder PNF D2 Flexion - 1 x daily - 7 x weekly - 1 sets - 10 reps Standing Lumbar Extension with Counter - 5 x daily - 7 x weekly - 1 sets - 10 reps Standing Lumbar Extension at Wall - Forearms - 5 x daily - 7 x weekly - 1 sets - 10 reps

## 2021-03-10 NOTE — Therapy (Signed)
[facility] [address] [place], [place], [address] Phone: [phone]   Fax:  [phone]  Physical Therapy Treatment  Patient Details  Name: Barbara Carlson MRN: [id number] Date of Birth: [birth year] Referring Provider (PT): Lawson Fiscal MD   Encounter Date: 03/10/2021   PT End of Session - 03/10/21 0943     Visit Number 7    Date for PT Re-Evaluation 04/12/21    Authorization Type UMR, medical review after 25 visits    Authorization - Visit Number 7    Authorization - Number of Visits 25    PT Start Time 0845    PT Stop Time 0931    PT Time Calculation (min) 46 min    Activity Tolerance Patient tolerated treatment well             Past Medical History:  Diagnosis Date   Asthma    as a child   Cancer ([place])    basal cell   Costochondritis, acute    couple years ago   Hyperlipidemia    diet controlled   Other dyspnea and respiratory abnormality    Palpitations    Post-operative nausea and vomiting    Tachycardia, unspecified     Past Surgical History:  Procedure Laterality Date   BASAL CELL CARCINOMA EXCISION  2004   right  abd   GANGLION CYST EXCISION  2015   right hand   REFRACTIVE SURGERY  2003   Bil   TONSILLECTOMY Bilateral 1987    There were no vitals filed for this visit.   Subjective Assessment - 03/10/21 0842     Subjective I'm better than I was.  Pain is across low back today.  No LE symptoms currently.  Last had left LE pain last night.  Going to see Sports Med doctor next Thursday.    Pertinent History bulging disc L5/S1;  wrist pain/cyst removal    Currently in Pain? Yes    Pain Score 3     Pain Location Back    Pain Orientation Right;Left    Pain Relieving Factors extension; TENS, ice                               [facility] Adult PT Treatment/Exercise - 03/10/21 0001       Therapeutic Activites    Therapeutic Activities Other Therapeutic  Activities    Other Therapeutic Activities use of a smaller towel roll with sitting; supine 90/90 position for relief; trial of endrange extension every 2 hours      Lumbar Exercises: Standing   Side Lunge Limitations left wall side glides 10x    Other Standing Lumbar Exercises lumbar extension with towel for overpressure    Other Standing Lumbar Exercises lumbar extension in front of the counter      Lumbar Exercises: Sidelying   Other Sidelying Lumbar Exercises left sidelying over folded pillow 2 min      Lumbar Exercises: Prone   Other Prone Lumbar Exercises press ups encouraging straighter elbows 10x    Other Prone Lumbar Exercises variatons of prone extension to encourage endrange      Manual Therapy   Manual therapy comments prone press ups with manual overpressure 8x    Joint Mobilization Gr I-3 PAs L3-L5                    PT Education - 03/10/21 [id number]  Education Details centralization principle;  prone and standing extensions every 2 hours    Person(s) Educated Patient    Methods Explanation;Demonstration;Handout    Comprehension Returned demonstration;Verbalized understanding              PT Short Term Goals - 03/03/21 1139       PT SHORT TERM GOAL #1   Title ind with initial HEP    Status On-going      PT SHORT TERM GOAL #2   Title patient able to consistently centralize LE sx with HEP    Status Achieved               PT Long Term Goals - 02/15/21 1112       PT LONG TERM GOAL #1   Title ind with advanced HEP    Time 8    Period Weeks    Status New      PT LONG TERM GOAL #2   Title FS score >= 66    Baseline baseline 51    Time 8    Period Weeks    Status New      PT LONG TERM GOAL #3   Title Patient able to sit and drive for one hour without pain.    Time 8    Period Weeks    Status New      PT LONG TERM GOAL #4   Title pt to demo good stabilization in the lumbar spine with functional ther ex and MMT of hip extensors.     Baseline -    Time 8    Period Weeks    Status New      PT LONG TERM GOAL #5   Title Pt will demonstrate full and pain free lumbar AROM for return to prior level of activity    Baseline -    Time 8    Period Weeks    Status New                   Plan - 03/10/21 [id number]     Clinical Impression Statement Positive response to extension biased movement.  Encouraged movement into endrange and increasing frequency of performance for full centralization of symptoms.  No peripheral symptoms produced.  Symptoms temporarily moved to right side of her back but primarily remained on left side.  Discussed plan for the weekend including positioning, activity and ex performance.  Therapist monitoring response and modifying treatment accordingly.    Personal Factors and Comorbidities Past/Current Experience    Rehab Potential Excellent    PT Frequency 2x / week    PT Duration 8 weeks    PT Treatment/Interventions ADLs/Self Care Home Management;Cryotherapy;Electrical Stimulation;Moist Heat;Traction;Neuromuscular re-education;Therapeutic exercise;Therapeutic activities;Patient/family education;Manual techniques;Dry needling    PT Next Visit Plan assess response to extension/overpressure every 2 hours;  if not fully centralized will focus on lateral movements;  DN as needed    PT Home Exercise Plan [phone]             Patient will benefit from skilled therapeutic intervention in order to improve the following deficits and impairments:  Decreased range of motion, Pain, Increased muscle spasms, Hypomobility, Impaired flexibility, Postural dysfunction, Decreased strength  Visit Diagnosis: Chronic left-sided low back pain with left-sided sciatica  Other muscle spasm     Problem List Patient Active Problem List   Diagnosis Date Noted   Midline low back pain without sciatica 04/11/2018   HYPERLIPIDEMIA-MIXED 09/25/2010   TACHYCARDIA 11/19/2008   PALPITATIONS  11/19/2008   Ruben Im, PT 03/10/21 9:50 AM Phone: [phone] Fax: [phone]  Alvera Singh 03/10/2021, 9:50 AM  [facility] [address] [place], [place], [address] Phone: [phone]   Fax:  [phone]  Name: THERESE LEFFINGWELL MRN: [id number] Date of Birth: 1961/06/23

## 2021-03-14 ENCOUNTER — Other Ambulatory Visit: Payer: Self-pay

## 2021-03-14 ENCOUNTER — Ambulatory Visit: Payer: 59 | Attending: Family Medicine | Admitting: Physical Therapy

## 2021-03-14 DIAGNOSIS — G8929 Other chronic pain: Secondary | ICD-10-CM | POA: Insufficient documentation

## 2021-03-14 DIAGNOSIS — M5442 Lumbago with sciatica, left side: Secondary | ICD-10-CM | POA: Insufficient documentation

## 2021-03-14 DIAGNOSIS — M62838 Other muscle spasm: Secondary | ICD-10-CM | POA: Insufficient documentation

## 2021-03-14 NOTE — Therapy (Signed)
[facility] [address], [place], [place], [address] Phone: [phone]   Fax:  [phone]  Physical Therapy Treatment  Patient Details  Name: Barbara Carlson MRN: [id number] Date of Birth: August 04, 1961 Referring Provider (PT): Lawson Fiscal MD   Encounter Date: 03/14/2021   PT End of Session - 03/14/21 1549     Visit Number 8    Date for PT Re-Evaluation 04/12/21    Authorization Type UMR, medical review after 25 visits    Authorization - Visit Number 8    Authorization - Number of Visits 25    PT Start Time [id number]    PT Stop Time 1610   DN heat   PT Time Calculation (min) 40 min    Activity Tolerance Patient tolerated treatment well             Past Medical History:  Diagnosis Date   Asthma    as a child   Cancer ([place])    basal cell   Costochondritis, acute    couple years ago   Hyperlipidemia    diet controlled   Other dyspnea and respiratory abnormality    Palpitations    Post-operative nausea and vomiting    Tachycardia, unspecified     Past Surgical History:  Procedure Laterality Date   BASAL CELL CARCINOMA EXCISION  2004   right  abd   GANGLION CYST EXCISION  2015   right hand   REFRACTIVE SURGERY  2003   Bil   TONSILLECTOMY Bilateral 1987    There were no vitals filed for this visit.   Subjective Assessment - 03/14/21 1529     Subjective I hurt.  I was so sore that night and the next day from extension.  Hurts all the way across.  Today I feel it down the leg.  It's about the same.  I've also felt it down the right leg while driving.  Seeing sports med doc on Thursday.  Sitting in church and riding is still the worst.    Pertinent History bulging disc L5/S1;  wrist pain/cyst removal    Currently in Pain? Yes    Pain Score 6     Pain Location Back    Pain Orientation Right;Left                               [facility] Adult PT Treatment/Exercise - 03/14/21 0001        Lumbar Exercises: Prone   Other Prone Lumbar Exercises discussion of limited extension as tolerated      Lumbar Exercises: Quadruped   Other Quadruped Lumbar Exercises use of quadruped rocking for pain management      Moist Heat Therapy   Number Minutes Moist Heat 5 Minutes    Moist Heat Location Lumbar Spine      Electrical Stimulation   Electrical Stimulation Location bil lumbar    Electrical Stimulation Action with DN pre mod 1.5 V   Electrical Stimulation Parameters 10 min    Electrical Stimulation Goals Pain      Manual Therapy   Soft tissue mobilization bil lumbar paraspinals              Trigger Point Dry Needling - 03/14/21 0001     Dry Needling Comments bil    Electrical Stimulation Performed with Dry Needling Yes    Lumbar multifidi Response Palpable increased muscle length  PT Education - 03/14/21 1600     Education Details quad rock    Person(s) Educated Patient    Methods Explanation;Demonstration;Handout    Comprehension Returned demonstration;Verbalized understanding              PT Short Term Goals - 03/03/21 1139       PT SHORT TERM GOAL #1   Title ind with initial HEP    Status On-going      PT SHORT TERM GOAL #2   Title patient able to consistently centralize LE sx with HEP    Status Achieved               PT Long Term Goals - 02/15/21 1112       PT LONG TERM GOAL #1   Title ind with advanced HEP    Time 8    Period Weeks    Status New      PT LONG TERM GOAL #2   Title FS score >= 66    Baseline baseline 51    Time 8    Period Weeks    Status New      PT LONG TERM GOAL #3   Title Patient able to sit and drive for one hour without pain.    Time 8    Period Weeks    Status New      PT LONG TERM GOAL #4   Title pt to demo good stabilization in the lumbar spine with functional ther ex and MMT of hip extensors.    Baseline -    Time 8    Period Weeks    Status New      PT LONG  TERM GOAL #5   Title Pt will demonstrate full and pain free lumbar AROM for return to prior level of activity    Baseline -    Time 8    Period Weeks    Status New                   Plan - 03/14/21 1611     Clinical Impression Statement The patient reports a worsening of pain across the low back and no change in LE symptoms.  She is unsure if this is related to an increase in extension ex's or sitting at church/in the car.  Treatment focus on pain relieving interventions including DN with ES to lumbar multifidi and instruction in gentle mobility ex.  Improved soft tissue mobilty noted with patient also reporting that it "does feel looser" post treatment session.  Given her continued pain not improved overall, will hold PT while she follows up with the sports medicine doctor on Thursday.    Rehab Potential Excellent    PT Frequency 2x / week    PT Duration 8 weeks    PT Treatment/Interventions ADLs/Self Care Home Management;Cryotherapy;Electrical Stimulation;Moist Heat;Traction;Neuromuscular re-education;Therapeutic exercise;Therapeutic activities;Patient/family education;Manual techniques;Dry needling    PT Next Visit Plan see how MD appt went;  reassess status    PT Home Exercise Plan [phone]             Patient will benefit from skilled therapeutic intervention in order to improve the following deficits and impairments:  Decreased range of motion, Pain, Increased muscle spasms, Hypomobility, Impaired flexibility, Postural dysfunction, Decreased strength  Visit Diagnosis: Chronic left-sided low back pain with left-sided sciatica  Other muscle spasm     Problem List Patient Active Problem List   Diagnosis Date Noted   Midline low back pain  without sciatica 04/11/2018   HYPERLIPIDEMIA-MIXED 09/25/2010   TACHYCARDIA 11/19/2008   PALPITATIONS 11/19/2008   Ruben Im, PT 03/14/21 4:17 PM Phone: [phone] Fax: [phone]  Alvera Singh 03/14/2021, 4:16  PM  [facility] [address] [place], [place], [address] Phone: [phone]   Fax:  [phone]  Name: Barbara Carlson MRN: [id number] Date of Birth: 22-Aug-1960

## 2021-03-14 NOTE — Patient Instructions (Signed)
Access Code: [email or web address] URL: [email or web address] Date: 03/14/2021 Prepared by: Ruben Im  Exercises Supine Sciatic Nerve Glide - 1 x daily - 7 x weekly - 1 sets - 10 reps - 5 sec hold Prone Press Up - 3-4 x daily - 7 x weekly - 1-3 sets - 10 reps Gastroc Stretch on Wall - 2 x daily - 7 x weekly - 1 sets - 3 reps - 30-60 sec hold Standing Quadriceps Stretch - 1 x daily - 7 x weekly - 1 sets - 3 reps - 30 hold Standing Lumbar Extension - 1 x daily - 7 x weekly - 1 sets - 5 reps - 5 hold Standing Hip Flexor Stretch - 1 x daily - 7 x weekly - 1 sets - 3 reps - 30 hold Cat Cow - 1 x daily - 7 x weekly - 1 sets - 10 reps Standing Hip Extension with Chair - 2 x daily - 7 x weekly - 1 sets - 10 reps Standing Hip Abduction with Anterior Support - 2 x daily - 7 x weekly - 1 sets - 10 reps Quarter Squat with Dumbbells - 1 x daily - 7 x weekly - 2 sets - 10 reps Standing Shoulder Flexion to 90 Degrees with Dumbbells - 1 x daily - 7 x weekly - 1 sets - 5 reps Shoulder Abduction with Dumbbells - Palms Down - 1 x daily - 7 x weekly - 1 sets - 5 reps Shoulder PNF D2 Flexion - 1 x daily - 7 x weekly - 1 sets - 10 reps Standing Lumbar Extension with Counter - 5 x daily - 7 x weekly - 1 sets - 10 reps Standing Lumbar Extension at Wall - Forearms - 5 x daily - 7 x weekly - 1 sets - 10 reps Quadruped Rocking Slow - 1 x daily - 7 x weekly - 1 sets - 10 reps

## 2021-03-15 NOTE — Progress Notes (Signed)
I, [name], LAT, ATC, am serving as scribe for Dr. [name].  Subjective:    I'm seeing this patient as a consultation for: Dr. Carolann Littler. Note will be routed back to referring provider/PCP.  CC: Low back/SI joint pain  HPI: Pt is a 60 y/o female c/o bilat low back/ L SI joint x 3 years that worsened in Feb 2022 when she sneezed while slightly flexed forward.  She has consistently been seeing a chiropractor but con't to struggle with LBP and SIJ pain.  Pt locates pain to her L SIJ currently w/ pain in her L lateral LE from the distal thigh to latera calf.  She is currently attending PT and has completed 8 visits with dry needling.    She is new issue of pain radiating down her left leg to her lateral calf ongoing over the past few months now.  Radiates: yes into her L lateral leg at the knee and calf LE numbness/tingling: No Aggravates: prolonged sitting; any stationary position for a prolonged period of time;  Treatments tried: PT, chiro, ice; heat  Diagnostic testing: L-spine XR at [facility] office in February and again about 2 months ago.  Past medical history, Surgical history, Family history, Social history, Allergies, and medications have been entered into the medical record, reviewed.   Review of Systems: No new headache, visual changes, nausea, vomiting, diarrhea, constipation, dizziness, abdominal pain, skin rash, fevers, chills, night sweats, weight loss, swollen lymph nodes, body aches, joint swelling, muscle aches, chest pain, shortness of breath, mood changes, visual or auditory hallucinations.   Objective:    Vitals:   03/16/21 1328  BP: 110/70  Pulse: 96  SpO2: 99%   General: Well Developed, well nourished, and in no acute distress.  Neuro/Psych: Alert and oriented x3, extra-ocular muscles intact, able to move all 4 extremities, sensation grossly intact. Skin: Warm and dry, no rashes noted.  Respiratory: Not using accessory muscles, speaking in full  sentences, trachea midline.  Cardiovascular: Pulses palpable, no extremity edema. Abdomen: Does not appear distended. MSK: L-spine nontender midline.  Mildly tender palpation left lumbar paraspinal musculature. Normal lumbar motion.  Lower extremity strength is intact bilaterally. Reflexes are neuro intact distally. Pulses cap refill and sensation are intact distally.   Lab and Radiology Results  X-ray images L-spine obtained February 2022 provided by the patient and independently interpreted today showing mild lumbar facet DJD lower portion lumbar spine.  Impression and Recommendations:    Assessment and Plan: 60 y.o. female with chronic low back pain with new onset of left lumbar radiculopathy and L5 dermatomal pattern.  Plan for MRI to further characterize cause of pain.  At this point she has had significant trials of conservative management including long-term ongoing physical therapy and chiropractor care.  Anticipate likely epidural steroid injection as result of MRI.  Additionally trial of gabapentin.  Discussed prednisone patient like to hold off for now.  Recheck after MRI.[name]  PDMP not reviewed this encounter. Orders Placed This Encounter  Procedures   MR Lumbar Spine Wo Contrast    Standing Status:   Future    Standing Expiration Date:   03/16/2022    Order Specific Question:   What is the patient's sedation requirement?    Answer:   No Sedation    Order Specific Question:   Does the patient have a pacemaker or implanted devices?    Answer:   No    Order Specific Question:   Preferred imaging location?  Answer:   Product/process development scientist (table limit-350lbs)   Meds ordered this encounter  Medications   gabapentin (NEURONTIN) 300 MG capsule    Sig: Take 1 capsule (300 mg total) by mouth 3 (three) times daily as needed.    Dispense:  90 capsule    Refill:  3    Discussed warning signs or symptoms. Please see discharge instructions. Patient expresses  understanding.   The above documentation has been reviewed and is accurate and complete [name], M.D.

## 2021-03-16 ENCOUNTER — Other Ambulatory Visit (HOSPITAL_COMMUNITY): Payer: Self-pay

## 2021-03-16 ENCOUNTER — Encounter: Payer: Self-pay | Admitting: Family Medicine

## 2021-03-16 ENCOUNTER — Other Ambulatory Visit: Payer: Self-pay

## 2021-03-16 ENCOUNTER — Ambulatory Visit (INDEPENDENT_AMBULATORY_CARE_PROVIDER_SITE_OTHER): Payer: 59 | Admitting: Family Medicine

## 2021-03-16 VITALS — BP 110/70 | HR 96 | Ht 63.0 in | Wt 141.6 lb

## 2021-03-16 DIAGNOSIS — M5442 Lumbago with sciatica, left side: Secondary | ICD-10-CM

## 2021-03-16 DIAGNOSIS — G8929 Other chronic pain: Secondary | ICD-10-CM

## 2021-03-16 MED ORDER — GABAPENTIN 300 MG PO CAPS
300.0000 mg | ORAL_CAPSULE | Freq: Three times a day (TID) | ORAL | 3 refills | Status: DC | PRN
  Filled 2021-03-16: qty 90, 30d supply, fill #0

## 2021-03-16 MED ORDER — CARESTART COVID-19 HOME TEST VI KIT
PACK | 0 refills | Status: DC
  Filled 2021-03-16: qty 4, 4d supply, fill #0

## 2021-03-16 NOTE — Patient Instructions (Addendum)
Thank you for coming in today.   You should hear from MRI scheduling within 1 week. If you do not hear please let me know.    Recheck following the MRI.   Keep me updated. Let me know if you have a problem.   Take the gabapentin as needed mostly at bedtime.

## 2021-03-17 ENCOUNTER — Ambulatory Visit: Payer: 59 | Admitting: Physical Therapy

## 2021-03-19 ENCOUNTER — Ambulatory Visit (INDEPENDENT_AMBULATORY_CARE_PROVIDER_SITE_OTHER): Payer: 59

## 2021-03-19 ENCOUNTER — Other Ambulatory Visit: Payer: Self-pay

## 2021-03-19 DIAGNOSIS — M5442 Lumbago with sciatica, left side: Secondary | ICD-10-CM

## 2021-03-19 DIAGNOSIS — G8929 Other chronic pain: Secondary | ICD-10-CM | POA: Diagnosis not present

## 2021-03-19 DIAGNOSIS — M545 Low back pain, unspecified: Secondary | ICD-10-CM | POA: Diagnosis not present

## 2021-03-19 IMAGING — MR MR LUMBAR SPINE W/O CM
4 of 5 series · 24 of 48 positions shown · non-contrast
Comparison: CT abdomen/pelvis [DATE].

CLINICAL DATA: Chronic left-sided low back pain with left-sided
sciatica M54.42, [ZJ] ([ZJ]-CM). Low back pain, > 6 wks.
Additional history provided by scanning technologist: Patient
reports left lower back and left leg pain after seen easing 6 months
ago, history of skin cancer.

EXAM:
MRI LUMBAR SPINE WITHOUT CONTRAST
TECHNIQUE: Multiplanar, multisequence MR imaging of the lumbar spine was
performed. No intravenous contrast was administered.

[Series 3: T2 · sagittal · 4.0mm · 0.81mm/px · 6 of 15 slices shown (1 of 2)]
[im 1/15]
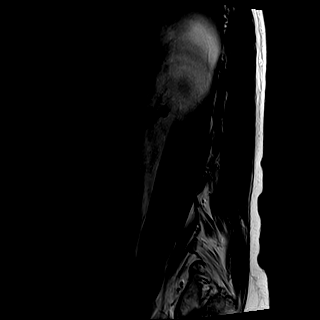
[im 3/15]
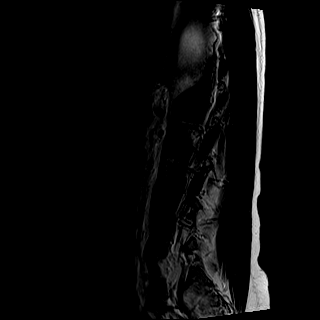
[im 6/15]
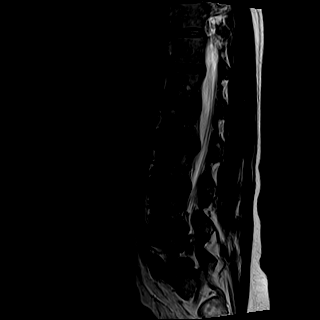
[im 9/15]
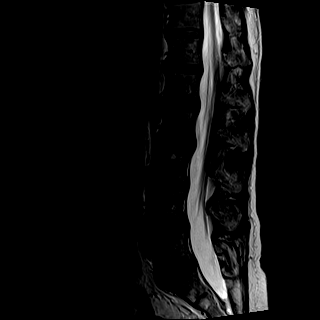
[im 12/15]
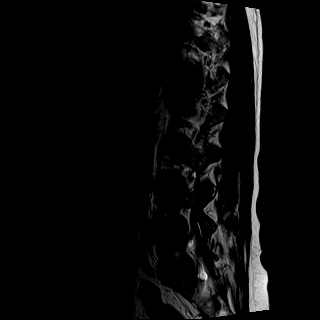
[im 15/15]
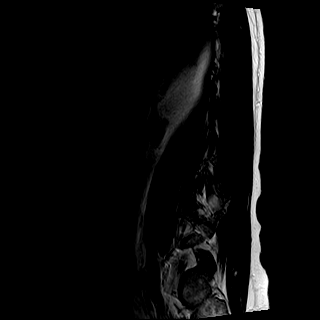

[Series 4: T1 · sagittal · 4.0mm · 0.41mm/px · 6 of 15 slices shown (1 of 2)]
[im 1/15]
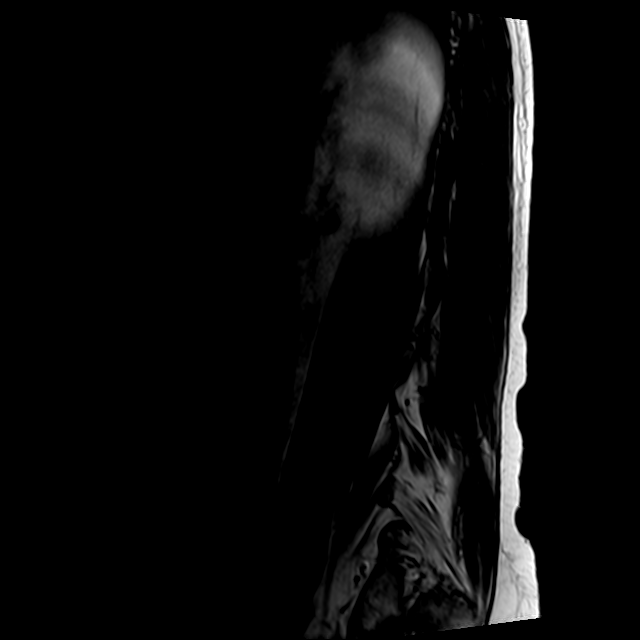
[im 3/15]
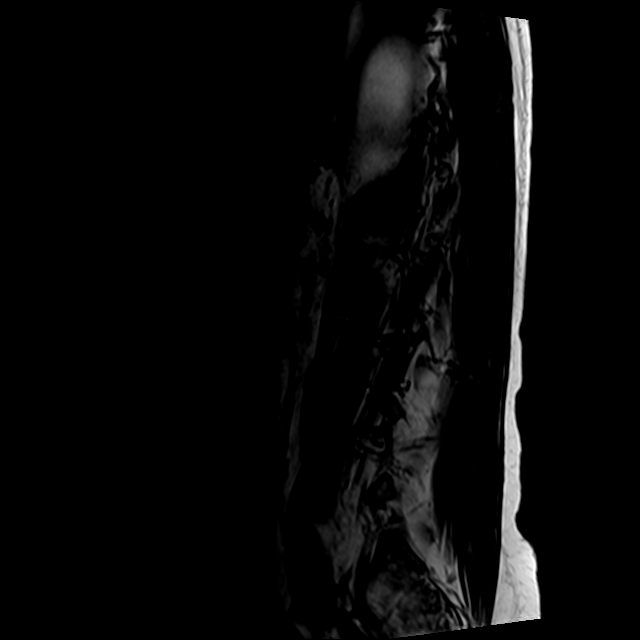
[im 6/15]
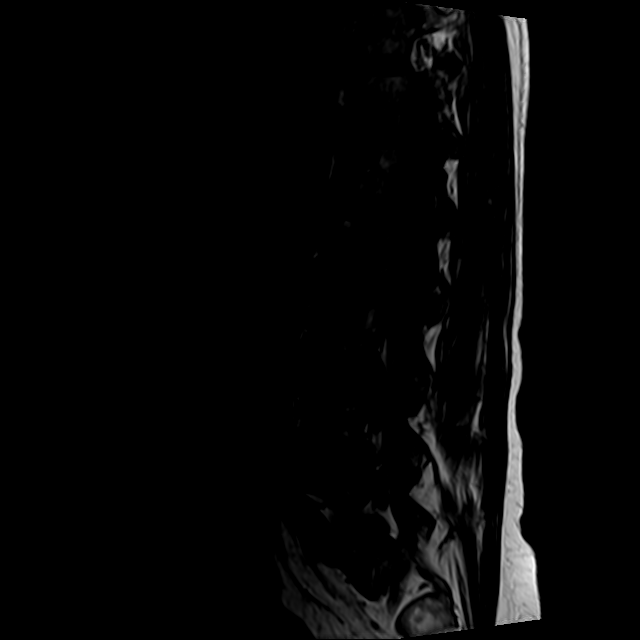
[im 9/15]
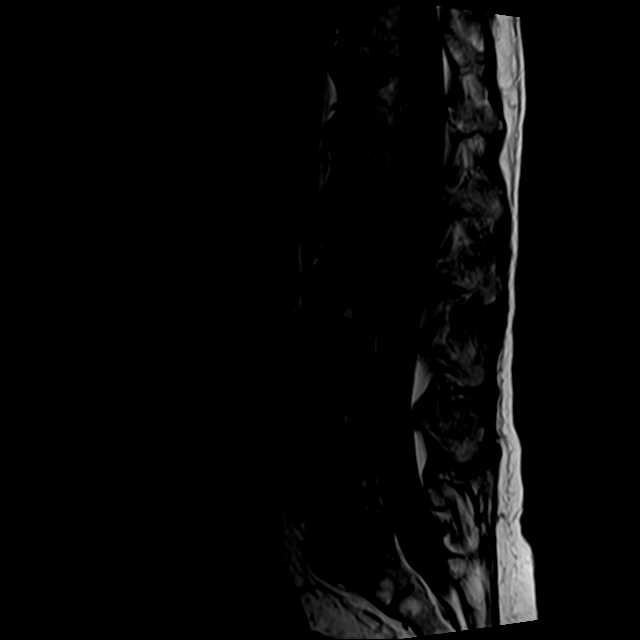
[im 12/15]
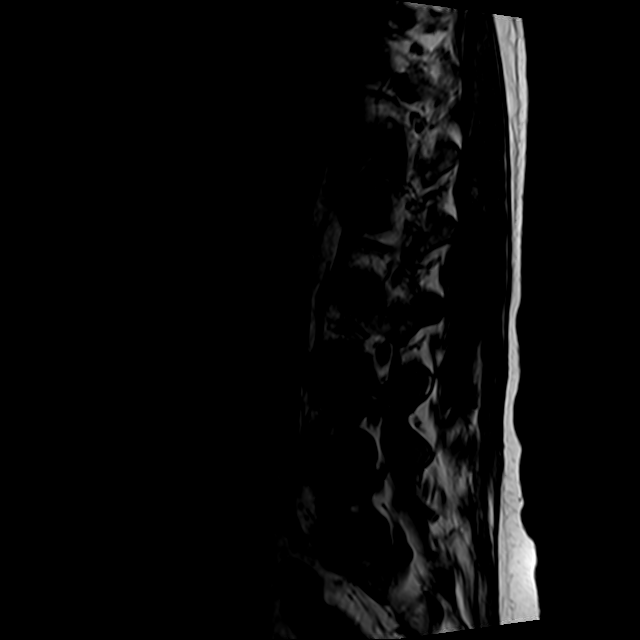
[im 15/15]
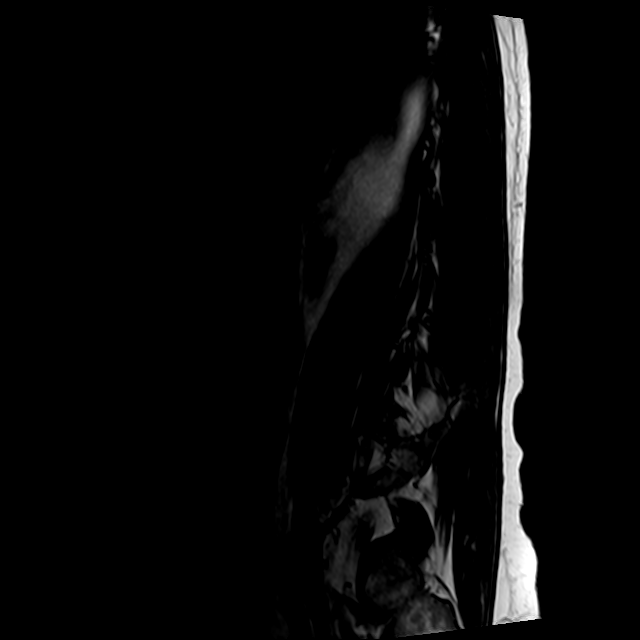

[Series 6: T2 · axial · 4.0mm · 0.78mm/px · z∈[-127,+86]mm · 9 of 39 slices shown (2 of 2)]
[im 1/39]
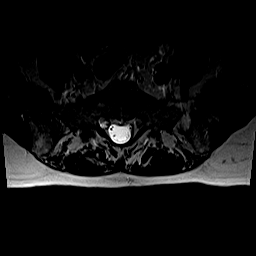
[im 6/39]
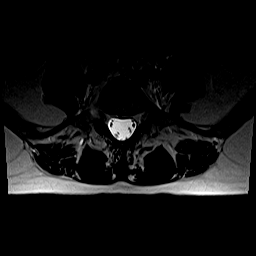
[im 11/39]
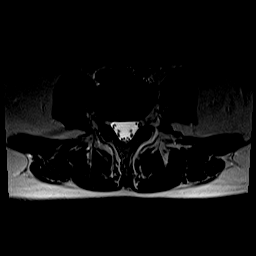
[im 17/39]
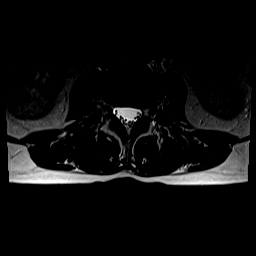
[im 20/39]
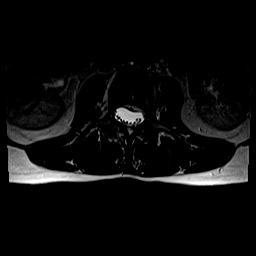
[im 22/39]
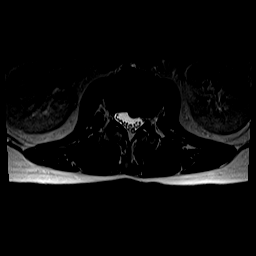
[im 28/39]
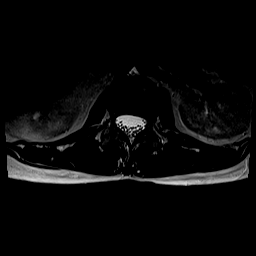
[im 33/39]
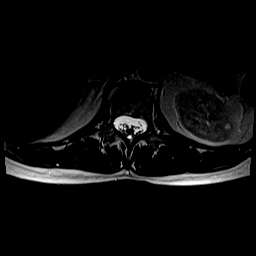
[im 39/39]
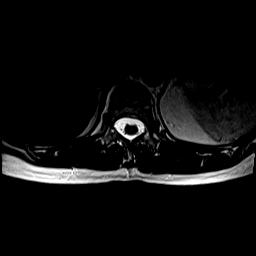

[Series 7: T1 · axial · 4.0mm · 0.39mm/px · z∈[-103,+56]mm · 3 of 39 slices shown (2 of 2)]
[im 6/39]
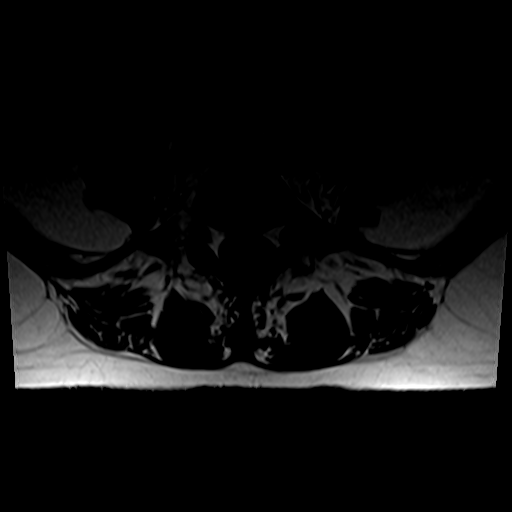
[im 20/39]
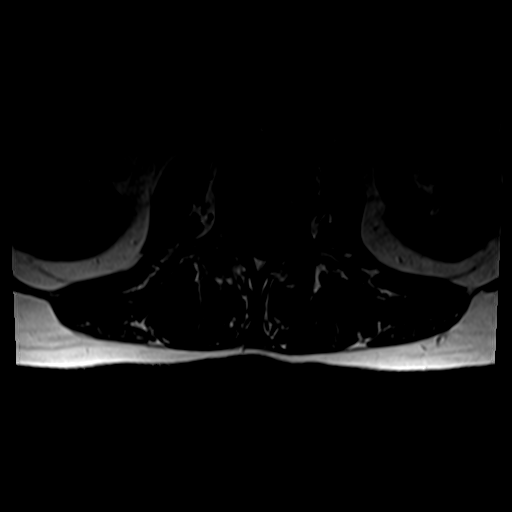
[im 33/39]
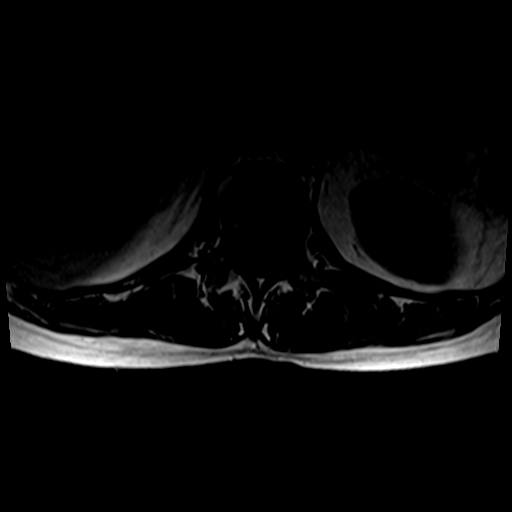

[24 of 48 positions shown; findings below may reference images not displayed]

FINDINGS: Segmentation: 5 lumbar vertebrae. The caudal most well-formed
intervertebral disc space is designated L5-S1.

Alignment:  Lumbar levocurvature.  No significant spondylolisthesis.

Vertebrae: Vertebral body height is maintained. Small L1 vertebral
body hemangioma. No significant marrow edema or focal suspicious
osseous lesion.

Conus medullaris and cauda equina: Conus extends to the L1-L2 level.
No signal abnormality within the visualized distal spinal cord.

Paraspinal and other soft tissues: Small bilateral renal cysts.
Paraspinal soft tissues unremarkable.

Disc levels:

No more than mild disc degeneration at any level.

T11-T12: Imaged sagittally. No significant disc herniation or
stenosis.

T12-L1: Disc bulge. Superimposed small left center disc protrusion.
The disc protrusion partially effaces the ventral thecal sac
contributing to mild relative spinal canal narrowing (without spinal
cord or nerve root mass effect). No significant foraminal stenosis.

L1-L2: No significant disc herniation or stenosis.

L2-L3: Irregular broad-based left center to left foraminal disc
protrusion. Mild central canal stenosis. Left subarticular narrowing
with the disc protrusion contacting and posteriorly displacing the
descending left L3 nerve root (series 6, image 18). Mild left
inferior neural foraminal narrowing.

L3-L4: Small disc bulge with mild endplate spurring. Superimposed
shallow broad-based left foraminal disc protrusion. No significant
spinal canal or foraminal stenosis

L4-L5: Disc bulge. Mild facet arthrosis/ligamentum flavum
hypertrophy. Superimposed small T2 hyperintense right
foraminal/extraforaminal disc protrusion. Mild left subarticular
narrowing with slight crowding of the descending left L5 nerve root
(series 6, image 30). Central canal patent. The right
foraminal/extraforaminal disc protrusion contributes to mild right
neural foraminal narrowing and contacts the exiting right L4 nerve
root (series 3, image 3). No significant left foraminal stenosis.

L5-S1: Left extraforaminal zone posterior annular fissure abutting
the left L5 nerve root beyond the left neural foramen (series 6,
image 36). Small disc bulge. Superimposed tiny right center disc
protrusion. Minimal facet arthrosis. No significant spinal canal or
foraminal stenosis.
IMPRESSION: Lumbar spondylosis, as outlined and with findings most notably as
follows.

At L2-L3, there is an irregular broad-based left center to left
foraminal disc protrusion. Resultant mild central canal stenosis.
The disc protrusion also results in left subarticular narrowing,
contacting and posteriorly displacing the descending left L3 nerve
root. Correlate for left L3 radiculopathy. The disc protrusion also
contributes to mild left inferior neural foraminal narrowing.

At T12-L1, a small left center disc protrusion contributes to mild
relative spinal canal narrowing (without spinal cord or nerve root
mass effect).

At L4-L5, there is multifactorial mild left subarticular narrowing
with slight crowding of the descending left L5 nerve root. Also at
this level, a right foraminal/extraforaminal disc protrusion
contributes to mild right neural foraminal narrowing, contacting the
exiting right L4 nerve root.

At L5-S1, a left extraforaminal zone posterior annular fissure abuts
the left L5 nerve root beyond the left neural foramen.

Lumbar levocurvature.

## 2021-03-20 ENCOUNTER — Other Ambulatory Visit (HOSPITAL_COMMUNITY): Payer: Self-pay

## 2021-03-21 ENCOUNTER — Telehealth: Payer: Self-pay | Admitting: Family Medicine

## 2021-03-21 ENCOUNTER — Other Ambulatory Visit: Payer: Self-pay

## 2021-03-21 ENCOUNTER — Ambulatory Visit: Payer: 59 | Admitting: Physical Therapy

## 2021-03-21 DIAGNOSIS — G8929 Other chronic pain: Secondary | ICD-10-CM | POA: Diagnosis not present

## 2021-03-21 DIAGNOSIS — M62838 Other muscle spasm: Secondary | ICD-10-CM | POA: Diagnosis not present

## 2021-03-21 DIAGNOSIS — M5442 Lumbago with sciatica, left side: Secondary | ICD-10-CM | POA: Diagnosis not present

## 2021-03-21 NOTE — Therapy (Signed)
[facility] [address], [place], [place], [address] Phone: [phone]   Fax:  [phone]  Physical Therapy Treatment  Patient Details  Name: Barbara Carlson MRN: [id number] Date of Birth: 1961/02/24 Referring Provider (PT): Lawson Fiscal MD   Encounter Date: 03/21/2021   PT End of Session - 03/21/21 1714     Visit Number 9    Date for PT Re-Evaluation 04/12/21    Authorization Type UMR, medical review after 25 visits    Authorization - Visit Number 9    Authorization - Number of Visits 25    PT Start Time 0940   late   PT Stop Time [id number]    PT Time Calculation (min) 43 min    Activity Tolerance Patient tolerated treatment well             Past Medical History:  Diagnosis Date   Asthma    as a child   Cancer ([place])    basal cell   Costochondritis, acute    couple years ago   Hyperlipidemia    diet controlled   Other dyspnea and respiratory abnormality    Palpitations    Post-operative nausea and vomiting    Tachycardia, unspecified     Past Surgical History:  Procedure Laterality Date   BASAL CELL CARCINOMA EXCISION  2004   right  abd   GANGLION CYST EXCISION  2015   right hand   REFRACTIVE SURGERY  2003   Bil   TONSILLECTOMY Bilateral 1987    There were no vitals filed for this visit.   Subjective Assessment - 03/21/21 0937     Subjective Got an MRI on Sunday.  Seeing the doctor to discuss results tomorrow.  He may recommend ESI.  Worked yesterday.    Pertinent History bulging disc L5/S1;  wrist pain/cyst removal    Diagnostic tests MRI L5-S1 Left extraoraminal zone posterior annular fissure abutting Left L5 nerve root beyond left neural foramen.  Small disc bulge.  superimposed tiny right center disc protrusion.    Patient Stated Goals decrease pain    Currently in Pain? Yes    Pain Score 5     Pain Location Back    Pain Orientation Right;Left    Pain Radiating Towards lateral left  knee                               [facility] Adult PT Treatment/Exercise - 03/21/21 0001       Neuro Re-ed    Neuro Re-ed Details  discussion on MRIresults and studies regarding imaging      Lumbar Exercises: Standing   Other Standing Lumbar Exercises pt demo lumbar extension on wall      Lumbar Exercises: Seated   Other Seated Lumbar Exercises discussion of appropriate ex's for this week to focus on including quadruped rocking which has gone well      Moist Heat Therapy   Moist Heat Location Lumbar Spine      Electrical Stimulation   Electrical Stimulation Location bil lumbar 1.5  8 min with DN   Electrical Stimulation Goals Pain      Manual Therapy   Soft tissue mobilization bil lumbar paraspinals              Trigger Point Dry Needling - 03/21/21 0001     Dry Needling Comments bil    Electrical Stimulation Performed with Dry Needling Yes  E-stim with Dry Needling Details 8 min    Lumbar multifidi Response Palpable increased muscle length                    PT Short Term Goals - 03/21/21 1724       PT SHORT TERM GOAL #1   Title ind with initial HEP    Status Achieved               PT Long Term Goals - 02/15/21 1112       PT LONG TERM GOAL #1   Title ind with advanced HEP    Time 8    Period Weeks    Status New      PT LONG TERM GOAL #2   Title FS score >= 66    Baseline baseline 51    Time 8    Period Weeks    Status New      PT LONG TERM GOAL #3   Title Patient able to sit and drive for one hour without pain.    Time 8    Period Weeks    Status New      PT LONG TERM GOAL #4   Title pt to demo good stabilization in the lumbar spine with functional ther ex and MMT of hip extensors.    Baseline -    Time 8    Period Weeks    Status New      PT LONG TERM GOAL #5   Title Pt will demonstrate full and pain free lumbar AROM for return to prior level of activity    Baseline -    Time 8    Period Weeks     Status New                   Plan - 03/21/21 1715     Clinical Impression Statement The patient had MRI on Sunday which indicated multiple thoracic and lumbar findings but the area of most signficance that fit with her radicular symptoms is the L5-S1  Left extraoraminal zone posterior annular fissure abutting Left L5 nerve root beyond left neural foramen.  She will follow up with the doctor to discuss findings and possible ESI.  Discussed exercise focus on low level mobility in painfree ranges of motion.  She reports improvement following DN with ES last visit and wishes to proceed with this again today.  Multiple tender points in bil lumbar paraspinals and multifidi today.  Therapist monitoring response throughout treatment session.    Personal Factors and Comorbidities Past/Current Experience    Stability/Clinical Decision Making Stable/Uncomplicated    Rehab Potential Excellent    PT Frequency 2x / week    PT Duration 8 weeks    PT Treatment/Interventions ADLs/Self Care Home Management;Cryotherapy;Electrical Stimulation;Moist Heat;Traction;Neuromuscular re-education;Therapeutic exercise;Therapeutic activities;Patient/family education;Manual techniques;Dry needling    PT Next Visit Plan may be getting ESI;  reassess symptoms/status and progress appropriately    PT Home Exercise Plan [phone]             Patient will benefit from skilled therapeutic intervention in order to improve the following deficits and impairments:  Decreased range of motion, Pain, Increased muscle spasms, Hypomobility, Impaired flexibility, Postural dysfunction, Decreased strength  Visit Diagnosis: Chronic left-sided low back pain with left-sided sciatica  Other muscle spasm     Problem List Patient Active Problem List   Diagnosis Date Noted   Chronic left-sided low back pain with left-sided sciatica 04/11/2018  HYPERLIPIDEMIA-MIXED 09/25/2010   TACHYCARDIA 11/19/2008   PALPITATIONS 11/19/2008    Ruben Im, PT 03/21/21 5:25 PM Phone: [phone] Fax: [phone]  Alvera Singh 03/21/2021, 5:25 PM  [facility] [address], [place], [place], [address] Phone: [phone]   Fax:  [phone]  Name: [place] MRN: [id number] Date of Birth: [birth year]

## 2021-03-21 NOTE — Telephone Encounter (Signed)
Epidural steroid injection ordered 

## 2021-03-21 NOTE — Progress Notes (Signed)
Lumbar MRI shows multiple different findings.  The dominant finding causing the pain that you are experiencing down your left leg is potential pinched nerve at L5-S1 contacting the left L5 nerve root.  We will discuss this dominant finding as well as all of the other issues in full detail when you see me on the 10th.  However I have already ordered what I think is the next step which is an epidural steroid injection.  Please contact [facility] to schedule the epidural steroid injection.  [id number].  We will also talk about that injection as well.

## 2021-03-22 ENCOUNTER — Other Ambulatory Visit: Payer: Self-pay

## 2021-03-22 ENCOUNTER — Ambulatory Visit (INDEPENDENT_AMBULATORY_CARE_PROVIDER_SITE_OTHER): Payer: 59 | Admitting: Family Medicine

## 2021-03-22 ENCOUNTER — Encounter: Payer: Self-pay | Admitting: Family Medicine

## 2021-03-22 VITALS — BP 102/72 | HR 80 | Ht 63.0 in | Wt 143.8 lb

## 2021-03-22 DIAGNOSIS — G8929 Other chronic pain: Secondary | ICD-10-CM | POA: Diagnosis not present

## 2021-03-22 DIAGNOSIS — M5442 Lumbago with sciatica, left side: Secondary | ICD-10-CM

## 2021-03-22 NOTE — Patient Instructions (Addendum)
Thank you for coming in today.   Proceed to the injection.   Let me know if you need me to order a 2nd or 3rd one.   STOP gabapentin.   Could use lyrica if needed.

## 2021-03-22 NOTE — Progress Notes (Signed)
I, Barbara Carlson, LAT, ATC, am serving as scribe for Dr. [name].  Barbara Carlson is a 60 y.o. female who presents to [place] [facility] today for f/u of chronic LBP and L SIJ pain and L-spine MRI review.  She was last seen by Dr. Georgina Snell on 03/16/21 and noted pain radiating from her L low back/SKJ into her L lateral calf.  She was prescribed Gabapentin to use at bedtime.  She has completed 8 PT sessions and has been seeing a Restaurant manager, fast food.  Today, pt reports con't pain in her L lower back/sacrum radiating into her L LE to her lateral calf.  She had a new episode of pain slightly more proximal after having to awkwardly bend down to vacuum behind a freezer in her garage.  She has been using the Gabapentin at night but notes that she doesn't feel that it is helping w/ her pain.  She is scheduled for her ESI this Friday, Aug. 12th at 1:15 pm.  Diagnostic testing: L-spine MRI- 03/19/21  Pertinent review of systems: No fevers or chills  Relevant historical information: Hyperlipidemia   Exam:  BP 102/72 (BP Location: Right Arm, Patient Position: Sitting, Cuff Size: Normal)   Pulse 80   Ht '5\' 3"'$  (1.6 m)   Wt 143 lb 12.8 oz (65.2 kg)   LMP 09/05/2010   SpO2 100%   BMI 25.47 kg/m  General: Well Developed, well nourished, and in no acute distress.   MSK: L-spine nontender midline normal lumbar motion.  Lower extremity strength is intact.    Lab and Radiology Results No results found for this or any previous visit (from the past 72 hour(s)). MR Lumbar Spine Wo Contrast  Result Date: 03/20/2021 CLINICAL DATA:  Chronic left-sided low back pain with left-sided sciatica M54.42, G89.29 (ICD-10-CM). Low back pain, > 6 wks. Additional history provided by scanning technologist: Patient reports left lower back and left leg pain after seen easing 6 months ago, history of skin cancer. EXAM: MRI LUMBAR SPINE WITHOUT CONTRAST TECHNIQUE: Multiplanar, multisequence MR imaging of the  lumbar spine was performed. No intravenous contrast was administered. COMPARISON:  CT abdomen/pelvis 04/11/2012. FINDINGS: Segmentation: 5 lumbar vertebrae. The caudal most well-formed intervertebral disc space is designated L5-S1. Alignment:  Lumbar levocurvature.  No significant spondylolisthesis. Vertebrae: Vertebral body height is maintained. Small L1 vertebral body hemangioma. No significant marrow edema or focal suspicious osseous lesion. Conus medullaris and cauda equina: Conus extends to the L1-L2 level. No signal abnormality within the visualized distal spinal cord. Paraspinal and other soft tissues: Small bilateral renal cysts. Paraspinal soft tissues unremarkable. Disc levels: No more than mild disc degeneration at any level. T11-T12: Imaged sagittally. No significant disc herniation or stenosis. T12-L1: Disc bulge. Superimposed small left center disc protrusion. The disc protrusion partially effaces the ventral thecal sac contributing to mild relative spinal canal narrowing (without spinal cord or nerve root mass effect). No significant foraminal stenosis. L1-L2: No significant disc herniation or stenosis. L2-L3: Irregular broad-based left center to left foraminal disc protrusion. Mild central canal stenosis. Left subarticular narrowing with the disc protrusion contacting and posteriorly displacing the descending left L3 nerve root (series 6, image 18). Mild left inferior neural foraminal narrowing. L3-L4: Small disc bulge with mild endplate spurring. Superimposed shallow broad-based left foraminal disc protrusion. No significant spinal canal or foraminal stenosis L4-L5: Disc bulge. Mild facet arthrosis/ligamentum flavum hypertrophy. Superimposed small T2 hyperintense right foraminal/extraforaminal disc protrusion. Mild left subarticular narrowing with slight crowding of the descending  left L5 nerve root (series 6, image 30). Central canal patent. The right foraminal/extraforaminal disc protrusion  contributes to mild right neural foraminal narrowing and contacts the exiting right L4 nerve root (series 3, image 3). No significant left foraminal stenosis. L5-S1: Left extraforaminal zone posterior annular fissure abutting the left L5 nerve root beyond the left neural foramen (series 6, image 36). Small disc bulge. Superimposed tiny right center disc protrusion. Minimal facet arthrosis. No significant spinal canal or foraminal stenosis. IMPRESSION: Lumbar spondylosis, as outlined and with findings most notably as follows. At L2-L3, there is an irregular broad-based left center to left foraminal disc protrusion. Resultant mild central canal stenosis. The disc protrusion also results in left subarticular narrowing, contacting and posteriorly displacing the descending left L3 nerve root. Correlate for left L3 radiculopathy. The disc protrusion also contributes to mild left inferior neural foraminal narrowing. At T12-L1, a small left center disc protrusion contributes to mild relative spinal canal narrowing (without spinal cord or nerve root mass effect). At L4-L5, there is multifactorial mild left subarticular narrowing with slight crowding of the descending left L5 nerve root. Also at this level, a right foraminal/extraforaminal disc protrusion contributes to mild right neural foraminal narrowing, contacting the exiting right L4 nerve root. At L5-S1, a left extraforaminal zone posterior annular fissure abuts the left L5 nerve root beyond the left neural foramen. Lumbar levocurvature. Electronically Signed   By: Kellie Simmering DO   On: 03/20/2021 08:34    I, [name], personally (independently) visualized and performed the interpretation of the images attached in this note.    Assessment and Plan: 60 y.o. female with low back pain left-sided with left lumbar radiculopathy and L5 dermatomal pattern.  Plan for epidural steroid injection.  This was already ordered with the MRI results came back in a few days  ago.  Reviewed the MRI results with the patient today and discussed epidural steroid injection and next steps.  She has gabapentin is not helpful so we have discontinued it.  Certainly can proceed with repeat injection if needed.  Patient to be updated with how she goes after the epidural steroid injection.   Discussed warning signs or symptoms. Please see discharge instructions. Patient expresses understanding.   The above documentation has been reviewed and is accurate and complete [name], M.D.   Total encounter time 20 minutes including face-to-face time with the patient and, reviewing past medical record, and charting on the date of service.   Treatment plan and options

## 2021-03-24 ENCOUNTER — Other Ambulatory Visit: Payer: Self-pay

## 2021-03-24 ENCOUNTER — Other Ambulatory Visit: Payer: 59

## 2021-03-24 ENCOUNTER — Ambulatory Visit
Admission: RE | Admit: 2021-03-24 | Discharge: 2021-03-24 | Disposition: A | Discharge status: Home / Self Care | Payer: 59 | Source: Ambulatory Visit | Attending: Family Medicine | Admitting: Family Medicine

## 2021-03-24 ENCOUNTER — Other Ambulatory Visit: Payer: Self-pay | Admitting: Family Medicine

## 2021-03-24 ENCOUNTER — Encounter: Payer: 59 | Admitting: Physical Therapy

## 2021-03-24 DIAGNOSIS — M47817 Spondylosis without myelopathy or radiculopathy, lumbosacral region: Secondary | ICD-10-CM | POA: Diagnosis not present

## 2021-03-24 DIAGNOSIS — G8929 Other chronic pain: Secondary | ICD-10-CM

## 2021-03-24 IMAGING — XA DG EPIDURAL NERVE ROOT
2 series · 2 of 2 positions shown · non-contrast
Comparison: none

CLINICAL DATA: Lumbosacral spondylosis without myelopathy with
radiculopathy. Left-sided low back pain with intermittent left
lateral leg pain. Left far lateral annular fissure at L5-S1 in close
proximity to the exiting left L5 nerve root. No prior injections or
surgery.

[Series 1: ortho standard · 1 of 1 slices shown (1 of 2)]
[im 1/1]
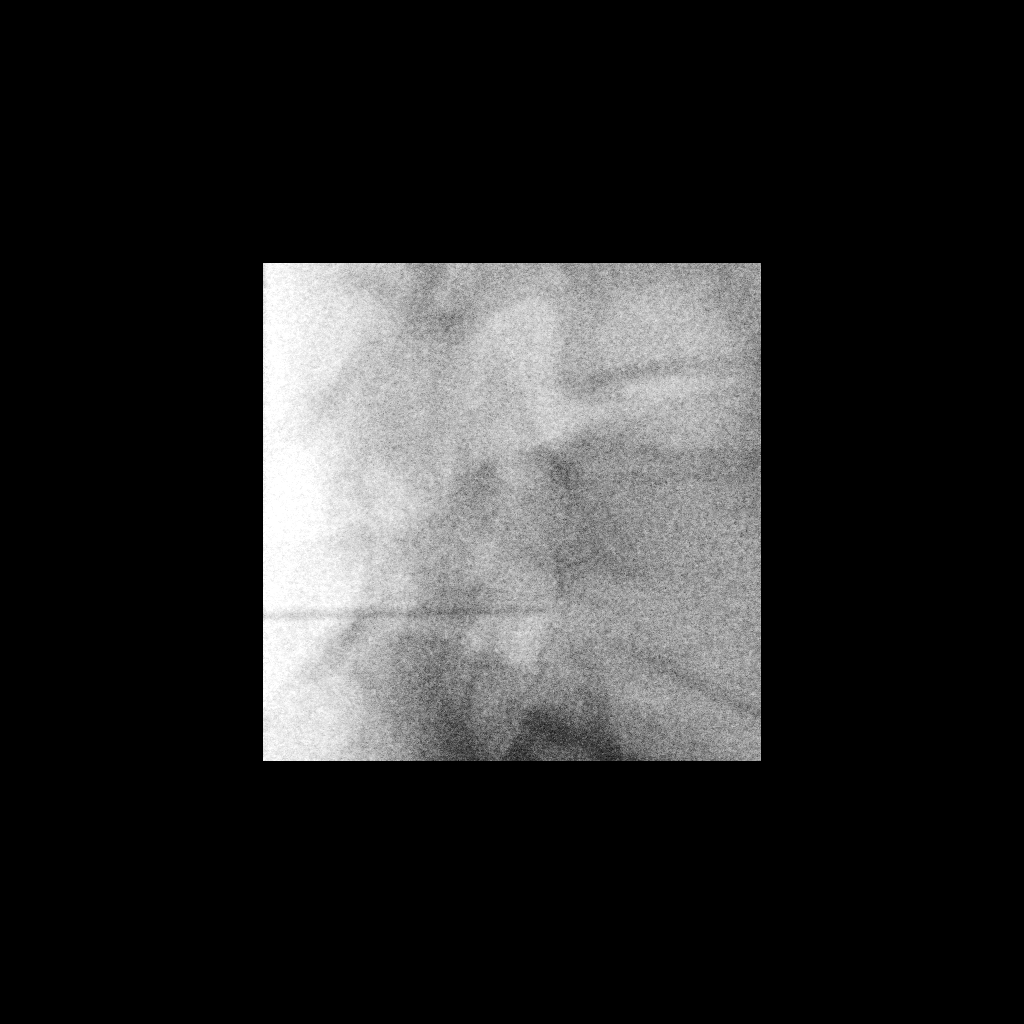

[Series 2: ortho standard · 1 of 1 slices shown (2 of 2)]
[im 1/1]
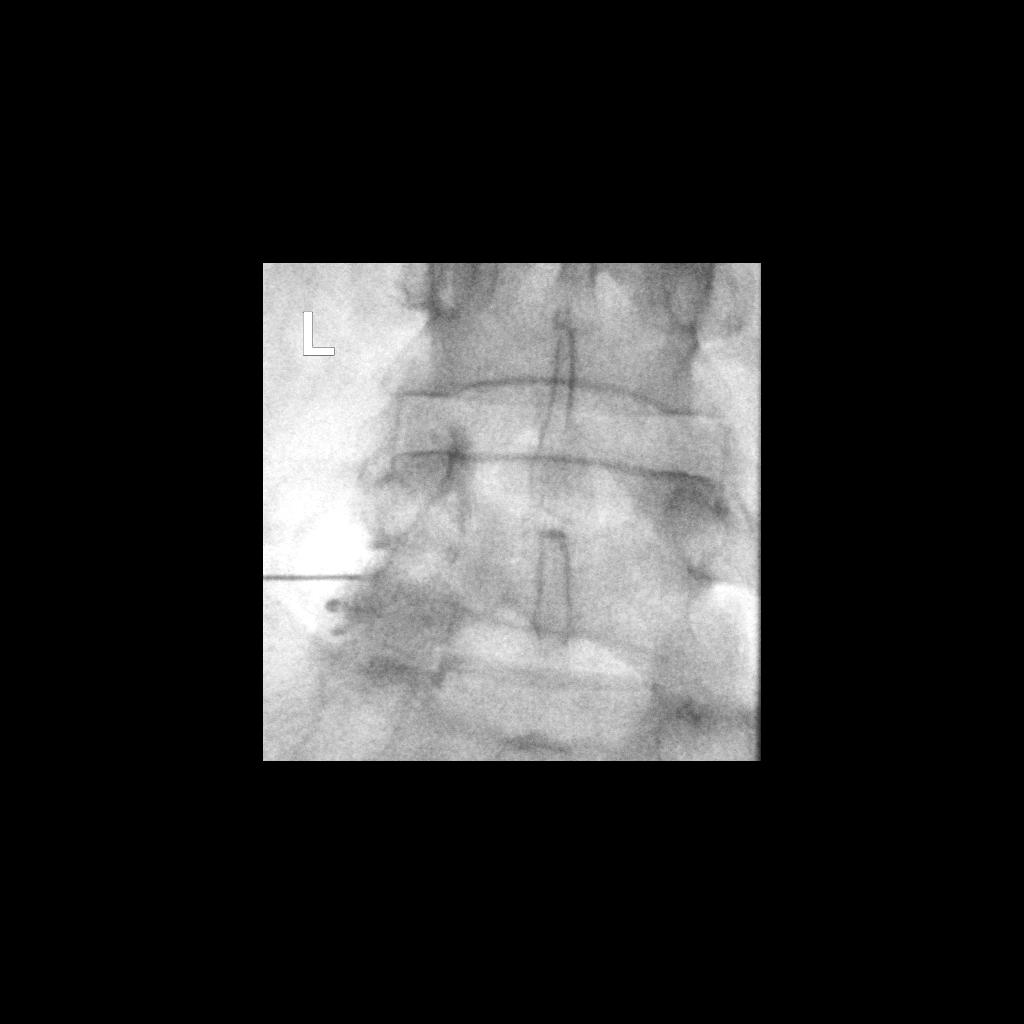

[2 of 2 positions shown; findings below may reference images not displayed]

EXAM:
EPIDURAL/NERVE ROOT

FLUOROSCOPY TIME:  Radiation Exposure Index (as provided by the
fluoroscopic device): 2.5 mGy

Fluoroscopy Time:  22 seconds

Number of Acquired Images:  0

PROCEDURE:
The procedure, risks, benefits, and alternatives were explained to
the patient. Questions regarding the procedure were encouraged and
answered. The patient understands and consents to the procedure.

LEFT L5 NERVE ROOT BLOCK AND TRANSFORAMINAL EPIDURAL: A posterior
oblique approach was taken to the intervertebral foramen on the left
at L5-S1 using a curved 3.5 inch 22 gauge spinal needle. Injection
of Isovue M 200 outlined the left L5 nerve root and showed good
epidural spread. No vascular opacification is seen. 80 mg of
Depo-Medrol mixed with 2 mL of 1% lidocaine were instilled. The
procedure was well-tolerated, and the patient was discharged thirty
minutes following the injection in good condition.

COMPLICATIONS:
None immediate.
IMPRESSION: Technically successful injection consisting of a left L5 nerve root
block and transforaminal epidural.

## 2021-03-24 MED ORDER — METHYLPREDNISOLONE ACETATE 40 MG/ML INJ SUSP (RADIOLOG
80.0000 mg | Freq: Once | INTRAMUSCULAR | Status: AC
  Administered 2021-03-24: 80 mg via EPIDURAL

## 2021-03-24 MED ORDER — IOPAMIDOL (ISOVUE-M 200) INJECTION 41%
1.0000 mL | Freq: Once | INTRAMUSCULAR | Status: AC
  Administered 2021-03-24: 1 mL via EPIDURAL

## 2021-03-24 NOTE — Discharge Instructions (Signed)

## 2021-03-28 ENCOUNTER — Encounter: Payer: Self-pay | Admitting: Physical Therapy

## 2021-03-28 ENCOUNTER — Ambulatory Visit: Payer: 59 | Admitting: Physical Therapy

## 2021-03-28 ENCOUNTER — Other Ambulatory Visit: Payer: Self-pay

## 2021-03-28 DIAGNOSIS — M5442 Lumbago with sciatica, left side: Secondary | ICD-10-CM | POA: Diagnosis not present

## 2021-03-28 DIAGNOSIS — G8929 Other chronic pain: Secondary | ICD-10-CM

## 2021-03-28 DIAGNOSIS — M62838 Other muscle spasm: Secondary | ICD-10-CM

## 2021-03-28 NOTE — Therapy (Signed)
[facility] [address], [place], [place], [address] Phone: [phone]   Fax:  [phone]  Physical Therapy Treatment  Patient Details  Name: Barbara Carlson MRN: [id number] Date of Birth: [birth year] Referring Provider (PT): Lawson Fiscal MD   Encounter Date: 03/28/2021   PT End of Session - 03/28/21 1536     Visit Number 10    Date for PT Re-Evaluation 04/12/21    Authorization Type UMR, medical review after 25 visits    Authorization - Visit Number 10    Authorization - Number of Visits 25    PT Start Time 1536   Pt late   PT Stop Time [id number]    PT Time Calculation (min) 39 min    Activity Tolerance Patient tolerated treatment well    Behavior During Therapy [facility] for tasks assessed/performed             Past Medical History:  Diagnosis Date   Asthma    as a child   Cancer ([place])    basal cell   Costochondritis, acute    couple years ago   Hyperlipidemia    diet controlled   Other dyspnea and respiratory abnormality    Palpitations    Post-operative nausea and vomiting    Tachycardia, unspecified     Past Surgical History:  Procedure Laterality Date   BASAL CELL CARCINOMA EXCISION  2004   right  abd   GANGLION CYST EXCISION  2015   right hand   REFRACTIVE SURGERY  2003   Bil   TONSILLECTOMY Bilateral 1987    There were no vitals filed for this visit.   Subjective Assessment - 03/28/21 1534     Subjective I had ESI on 8/12 (4 days ago).  I don't want any more needles today but things went very smoothly.  I had some intense Lt leg pain several hours after the injection throught the night but that has subsided.  I continue to have a nagging ache in my Lt low back and I continue to have some radiation of pain into Lt leg to ankle.  I continue to have increased pain if I sit or stretch.    Pertinent History bulging disc L5/S1;  wrist pain/cyst removal    How long can you sit comfortably? 20  min    Diagnostic tests MRI L5-S1 Left extraoraminal zone posterior annular fissure abutting Left L5 nerve root beyond left neural foramen.  Small disc bulge.  superimposed tiny right center disc protrusion.    Patient Stated Goals decrease pain    Currently in Pain? Yes    Pain Score 3     Pain Location Back    Pain Orientation Left    Pain Descriptors / Indicators Nagging;Radiating    Pain Type Chronic pain;Acute pain    Pain Radiating Towards posterior thigh lateral calf    Pain Onset More than a month ago    Pain Frequency Constant    Aggravating Factors  sitting    Pain Relieving Factors extension, TENS, ice                               [facility] Adult PT Treatment/Exercise - 03/28/21 0001       Self-Care   Self-Care Other Self-Care Comments    Other Self-Care Comments  positioning and strategies for intimacy with back pain      Exercises   Exercises  Other Exercises;Lumbar    Other Exercises  verbal review and several rep practice of appropriate ther ex from HEP and exercises to put on hold for now given lumbar presentation: ok to continue hip abd/ext and mini squat with vertical trunk at counter, add heel/toe raise, continue weight shifts, lumbar extension positioning      Lumbar Exercises: Quadruped   Other Quadruped Lumbar Exercises rocking (small range) in lumbar extension                      PT Short Term Goals - 03/21/21 1724       PT SHORT TERM GOAL #1   Title ind with initial HEP    Status Achieved               PT Long Term Goals - 02/15/21 1112       PT LONG TERM GOAL #1   Title ind with advanced HEP    Time 8    Period Weeks    Status New      PT LONG TERM GOAL #2   Title FS score >= 66    Baseline baseline 51    Time 8    Period Weeks    Status New      PT LONG TERM GOAL #3   Title Patient able to sit and drive for one hour without pain.    Time 8    Period Weeks    Status New      PT LONG TERM GOAL #4    Title pt to demo good stabilization in the lumbar spine with functional ther ex and MMT of hip extensors.    Baseline -    Time 8    Period Weeks    Status New      PT LONG TERM GOAL #5   Title Pt will demonstrate full and pain free lumbar AROM for return to prior level of activity    Baseline -    Time 8    Period Weeks    Status New                   Plan - 03/28/21 1728     Clinical Impression Statement Pt is 4 days s/p ESI.  She continues to have 3/10 soreness along Lt lumbar, SI region, posterior thigh and lateral calf.  MD recommended waiting a full 2 weeks to decide if injection had helped and if she would potentially want another one.  She wished to review the dos and don'ts at this point from her HEP so most of session spent verbally reviewing HEP and ruling in all extension based ther ex and neutral spine standing LE counter exercises.  PT encouraged Pt to use weight shifting and staggered stance as her legs get fatigued with standing desk at work.  Pt does sit but is limited to 20 min due to increased discomfort in sitting.  PT and Pt also discussed ideas for positioning with intimacy with husband today per Pt's request.  Pt will continue to need monitoring and ongoing assessment of response to ESI in presence of ongoing acute disc presentation with L5 radiculopathy on Lt.    PT Frequency 2x / week    PT Duration 8 weeks    PT Treatment/Interventions ADLs/Self Care Home Management;Cryotherapy;Electrical Stimulation;Moist Heat;Traction;Neuromuscular re-education;Therapeutic exercise;Therapeutic activities;Patient/family education;Manual techniques;Dry needling    PT Next Visit Plan ongoing assessment of response to ESI and progression of ther ex when able  PT Home Exercise Plan [id number] and Agree with Plan of Care Patient             Patient will benefit from skilled therapeutic intervention in order to improve the following deficits and  impairments:     Visit Diagnosis: Chronic left-sided low back pain with left-sided sciatica  Other muscle spasm     Problem List Patient Active Problem List   Diagnosis Date Noted   Chronic left-sided low back pain with left-sided sciatica 04/11/2018   HYPERLIPIDEMIA-MIXED 09/25/2010   TACHYCARDIA 11/19/2008   PALPITATIONS 11/19/2008    Baruch Merl, PT 03/28/21 5:33 PM   [facility] [address] [place], [place], [address] Phone: [phone]   Fax:  [phone]  Name: Barbara Carlson MRN: [id number] Date of Birth: August 28, 1960

## 2021-03-31 ENCOUNTER — Other Ambulatory Visit: Payer: Self-pay

## 2021-03-31 ENCOUNTER — Encounter: Payer: Self-pay | Admitting: Physical Therapy

## 2021-03-31 ENCOUNTER — Ambulatory Visit: Payer: 59 | Admitting: Physical Therapy

## 2021-03-31 DIAGNOSIS — M62838 Other muscle spasm: Secondary | ICD-10-CM | POA: Diagnosis not present

## 2021-03-31 DIAGNOSIS — G8929 Other chronic pain: Secondary | ICD-10-CM | POA: Diagnosis not present

## 2021-03-31 DIAGNOSIS — M5442 Lumbago with sciatica, left side: Secondary | ICD-10-CM | POA: Diagnosis not present

## 2021-03-31 NOTE — Patient Instructions (Signed)
  Lubrication Used for intercourse to reduce friction Avoid ones that have glycerin, warming gels, tingling gels, icing or cooling gel, scented Avoid parabens due to a preservative similar to female sex hormone May need to be reapplied once or several times during sexual activity Can be applied to both partners genitals prior to vaginal penetration to minimize friction or irritation Prevent irritation and mucosal tears that cause post coital pain and increased the risk of vaginal and urinary tract infections Oil-based lubricants cannot be used with condoms due to breaking them down.  Least likely to irritate vaginal tissue.  Plant based-lubes are safe Silicone-based lubrication are thicker and last long and used for post-menopausal women  Vaginal Lubricators Here is a list of some suggested lubricators you can use for intercourse. Use the most hypoallergenic product.  You can place on you or your partner.  Slippery Stuff ( water based) Sylk or Sliquid Natural H2O ( good  if frequent UTI's)- walmart, amazon Sliquid organics silk-(aloe and silicone based ) [employer] (www.blossom-organics.com)- (aloe based ) Coconut oil, olive oil -not good with condoms  PJur Woman Nude- (water based) amazon Uberlube- ( silicon) [place] has an organic one Yes lubricant- (water based and has plant oil based similar to silicone) [employer] Platinum-Silicone, [employer], [facility] Olive and Bee intimate cream-  www.oliveandbee.com.au Pink - [employer] stuff Erosense Sync- walmart, amazon Coconu- FailLinks.co.uk  Things to avoid in lubricants are glycerin, warming gels, tingling gels, icing or cooling  gels, and scented gels.  Also avoid Vaseline. KY jelly, Replens, and Astroglide contain chlorhexidine which kills good bacteria(lactobacilli)  Things to avoid in the vaginal area Do not use things to irritate the vulvar area No lotions- see below Soaps you  can use :Aveeno, [place], Good  Clean Love cleanser if needed. Must be gentle No deodorants No douches Good to sleep without underwear to let the vaginal area to air out No scrubbing: spread the lips to let warm water rinse over labias and pat dry  Creams that can be used on the Vulva Area V [employer], [employer] Vital V [place] Salve Julva- [employer] Botanical Pro-Meno Wild Yam Cream Coconut oil, olive oil Cleo by [employer] labial moisturizer -[place],  [place] Releveum ( lidocaine) or Desert [employer] Yes Moisturizer

## 2021-03-31 NOTE — Therapy (Signed)
[facility] [address], [place], [place], [address] Phone: [phone]   Fax:  [phone]  Physical Therapy Treatment  Patient Details  Name: Barbara Carlson MRN: [id number] Date of Birth: Oct 14, 1960 Referring Provider (PT): Lawson Fiscal MD   Encounter Date: 03/31/2021   PT End of Session - 03/31/21 0931     Visit Number 11    Date for PT Re-Evaluation 04/12/21    Authorization Type UMR, medical review after 25 visits    Authorization - Visit Number 11    Authorization - Number of Visits 25    PT Start Time [phone]    PT Stop Time 1015    PT Time Calculation (min) 44 min    Activity Tolerance Patient tolerated treatment well    Behavior During Therapy [facility] for tasks assessed/performed             Past Medical History:  Diagnosis Date   Asthma    as a child   Cancer ([place])    basal cell   Costochondritis, acute    couple years ago   Hyperlipidemia    diet controlled   Other dyspnea and respiratory abnormality    Palpitations    Post-operative nausea and vomiting    Tachycardia, unspecified     Past Surgical History:  Procedure Laterality Date   BASAL CELL CARCINOMA EXCISION  2004   right  abd   GANGLION CYST EXCISION  2015   right hand   REFRACTIVE SURGERY  2003   Bil   TONSILLECTOMY Bilateral 1987    There were no vitals filed for this visit.   Subjective Assessment - 03/31/21 0931     Subjective I have been very sore across my low back.  I can feel it in the leg today.  I continue to use ice.  I didn't take Neurontin last night and did not sleep. I worry I am becoming dependent on it to sleep.    Pertinent History bulging disc L5/S1;  wrist pain/cyst removal    How long can you sit comfortably? 20 min    Diagnostic tests MRI L5-S1 Left extraoraminal zone posterior annular fissure abutting Left L5 nerve root beyond left neural foramen.  Small disc bulge.  superimposed tiny right center  disc protrusion.    Patient Stated Goals decrease pain    Currently in Pain? Yes    Pain Score 2     Pain Location Back    Pain Orientation Right;Left;Lower    Pain Descriptors / Indicators Sore    Pain Type Chronic pain;Acute pain    Pain Radiating Towards post thigh and lateral calf    Pain Onset More than a month ago    Pain Frequency Constant    Aggravating Factors  sitting    Pain Relieving Factors extension, ice, TENS                               [facility] Adult PT Treatment/Exercise - 03/31/21 0001       Exercises   Exercises Shoulder;Lumbar;Knee/Hip      Lumbar Exercises: Stretches   Gastroc Stretch Limitations oscillating slant board, Lt x 1' for neural flossing      Lumbar Exercises: Standing   Other Standing Lumbar Exercises wall plank with TA indraw focus on exhale x 10 reps      Lumbar Exercises: Sidelying   Hip Abduction Left;5 reps  Hip Abduction Limitations soreness with this      Shoulder Exercises: Standing   Flexion Strengthening;Left;Theraband;10 reps    Theraband Level (Shoulder Flexion) Level 2 (Red)    Extension Strengthening;Both;10 reps;Theraband    Theraband Level (Shoulder Extension) Level 2 (Red)    Row Strengthening;Both;10 reps;Theraband    Theraband Level (Shoulder Row) Level 2 (Red)    Other Standing Exercises palloff press red x 10 each direction              Trigger Point Dry Needling - 03/31/21 0001     Consent Given? Yes    Education Handout Provided Previously provided    Muscles Treated Back/Hip Gluteus medius;Piriformis;Lumbar multifidi    Dry Needling Comments Lt hip, bil mutlifidi    Gluteus Medius Response Twitch response elicited;Palpable increased muscle length    Piriformis Response Twitch response elicited;Palpable increased muscle length    Lumbar multifidi Response Twitch response elicited;Palpable increased muscle length                    PT Short Term Goals - 03/21/21 1724        PT SHORT TERM GOAL #1   Title ind with initial HEP    Status Achieved               PT Long Term Goals - 02/15/21 1112       PT LONG TERM GOAL #1   Title ind with advanced HEP    Time 8    Period Weeks    Status New      PT LONG TERM GOAL #2   Title FS score >= 66    Baseline baseline 51    Time 8    Period Weeks    Status New      PT LONG TERM GOAL #3   Title Patient able to sit and drive for one hour without pain.    Time 8    Period Weeks    Status New      PT LONG TERM GOAL #4   Title pt to demo good stabilization in the lumbar spine with functional ther ex and MMT of hip extensors.    Baseline -    Time 8    Period Weeks    Status New      PT LONG TERM GOAL #5   Title Pt will demonstrate full and pain free lumbar AROM for return to prior level of activity    Baseline -    Time 8    Period Weeks    Status New                   Plan - 03/31/21 0945     Clinical Impression Statement Pt with ongoing soreness and radiation of Lt LE pain along L5 pattern.  She is 1 week s/p ESI.  She noted increased soreness across entire low back end of work day yesterday and hadn't slept well last night due to not taking Neurontin.  She continues to avoid sitting at work due to pain.  Pt had signif tightness along lumbar region bil and into TPs in lateral Lt hip.  DN used today for Lt hip and bil lumbar mulitifi with most notable response in Lt lateral hip.  PT found wall plank with cue to exhale to initiate indraw of TA yielded improved awareness and activation of TA to date.  Pt very sore along Lt lumbar region end of session following DN.  PT recommended taper to 1 visit next week with ongoing self-management of pain via HEP with plan to reassess status next visit as she will be 2 weeks out from [facility] at that time.    Rehab Potential Excellent    PT Frequency 2x / week    PT Duration 8 weeks    PT Treatment/Interventions ADLs/Self Care Home  Management;Cryotherapy;Electrical Stimulation;Moist Heat;Traction;Neuromuscular re-education;Therapeutic exercise;Therapeutic activities;Patient/family education;Manual techniques;Dry needling    PT Next Visit Plan ongoing assessment of response to ESI and progression of ther ex when able, did DN of Lt hip help?    PT Home Exercise Plan [id number] and Agree with Plan of Care Patient             Patient will benefit from skilled therapeutic intervention in order to improve the following deficits and impairments:     Visit Diagnosis: Chronic left-sided low back pain with left-sided sciatica  Other muscle spasm     Problem List Patient Active Problem List   Diagnosis Date Noted   Chronic left-sided low back pain with left-sided sciatica 04/11/2018   HYPERLIPIDEMIA-MIXED 09/25/2010   TACHYCARDIA 11/19/2008   PALPITATIONS 11/19/2008   Baruch Merl, PT 03/31/21 12:11 PM   [facility] [address] [place], [place], [address] Phone: [phone]   Fax:  [phone]  Name: Barbara Carlson MRN: [id number] Date of Birth: 1960/12/26

## 2021-04-04 ENCOUNTER — Encounter: Payer: 59 | Admitting: Physical Therapy

## 2021-04-05 ENCOUNTER — Other Ambulatory Visit (HOSPITAL_COMMUNITY): Payer: Self-pay

## 2021-04-07 ENCOUNTER — Encounter: Payer: 59 | Admitting: Physical Therapy

## 2021-04-11 ENCOUNTER — Encounter: Payer: Self-pay | Admitting: Physical Therapy

## 2021-04-11 ENCOUNTER — Other Ambulatory Visit: Payer: Self-pay

## 2021-04-11 ENCOUNTER — Ambulatory Visit: Payer: 59 | Admitting: Physical Therapy

## 2021-04-11 DIAGNOSIS — M62838 Other muscle spasm: Secondary | ICD-10-CM

## 2021-04-11 DIAGNOSIS — G8929 Other chronic pain: Secondary | ICD-10-CM

## 2021-04-11 DIAGNOSIS — M5442 Lumbago with sciatica, left side: Secondary | ICD-10-CM | POA: Diagnosis not present

## 2021-04-11 NOTE — Therapy (Addendum)
[facility] [address], [place], [place], [address] Phone: [phone]   Fax:  [phone]  Physical Therapy Treatment  Patient Details  Name: Barbara Carlson MRN: [id number] Date of Birth: 07-May-1961 Referring Provider (PT): Lawson Fiscal MD   Encounter Date: 04/11/2021   PT End of Session - 04/11/21 1023     Visit Number 12    Date for PT Re-Evaluation 04/12/21    Authorization Type UMR, medical review after 25 visits    Authorization - Visit Number 12    Authorization - Number of Visits 25    PT Start Time 5916   Pt late   PT Stop Time 1051    PT Time Calculation (min) 28 min    Activity Tolerance Patient tolerated treatment well    Behavior During Therapy [facility] for tasks assessed/performed             Past Medical History:  Diagnosis Date   Asthma    as a child   Cancer ([place])    basal cell   Costochondritis, acute    couple years ago   Hyperlipidemia    diet controlled   Other dyspnea and respiratory abnormality    Palpitations    Post-operative nausea and vomiting    Tachycardia, unspecified     Past Surgical History:  Procedure Laterality Date   BASAL CELL CARCINOMA EXCISION  2004   right  abd   GANGLION CYST EXCISION  2015   right hand   REFRACTIVE SURGERY  2003   Bil   TONSILLECTOMY Bilateral 1987    There were no vitals filed for this visit.   Subjective Assessment - 04/11/21 1025     Subjective Pt was able to vacuum last night without pain.  She did report a cramp in Lt foot last night.  Pt has noted less Lt LE pain x 1 week but ongoing central LBP.  Has been able to sit for up to 2 hours with slight posture adjustments with more tolerance.    Pertinent History bulging disc L5/S1;  wrist pain/cyst removal    How long can you sit comfortably? improved to up to 2 hours    Diagnostic tests MRI L5-S1 Left extraoraminal zone posterior annular fissure abutting Left L5 nerve root  beyond left neural foramen.  Small disc bulge.  superimposed tiny right center disc protrusion.    Patient Stated Goals decrease pain    Currently in Pain? Yes    Pain Score 2     Pain Location Back    Pain Orientation Lower;Mid;Left;Right    Pain Descriptors / Indicators Sore    Pain Type Chronic pain    Pain Radiating Towards less Lt LE pain    Aggravating Factors  sitting, driving                [facility] PT Assessment - 04/11/21 0001       Assessment   Medical Diagnosis chronic left SIJ pain    Referring Provider (PT) Lawson Fiscal MD    Onset Date/Surgical Date 09/13/20    Hand Dominance Right    Next MD Visit no    Prior Therapy yes      Observation/Other Assessments   Focus on Therapeutic Outcomes (FOTO)  52% (from 51%), goal 66%      Posture/Postural Control   Posture Comments left mild scoliosis      AROM   Overall AROM Comments lumbar flexion to 45 deg with  lumbar and sacral extension of pain, all other ROM of trunk WNL and painfree      Strength   Overall Strength Comments LE strength 5/5 bil      Palpation   Spinal mobility atrophy noted Lt glut max and lumbar multifidi L4-S1    Palpation comment mild tenderness along Lt SI joint and gluteals                           [facility] Adult PT Treatment/Exercise - 04/11/21 0001       Self-Care   Self-Care Other Self-Care Comments    Other Self-Care Comments  discussion of re-eval findings, symptom management and HEP reinforcement during next few weeks                      PT Short Term Goals - 04/11/21 1024       PT SHORT TERM GOAL #1   Title ind with initial HEP    Status Achieved      PT SHORT TERM GOAL #2   Title patient able to consistently centralize LE sx with HEP    Status Achieved               PT Long Term Goals - 04/11/21 1025       PT LONG TERM GOAL #1   Title ind with advanced HEP    Status On-going      PT LONG TERM GOAL #2   Title FS score >= 66     Baseline 52% on 04/11/21    Status On-going      PT LONG TERM GOAL #3   Title Patient able to sit and drive for one hour without pain.    Baseline has done 1-2 hours but with pain    Status On-going      PT LONG TERM GOAL #4   Title pt to demo good stabilization in the lumbar spine with functional ther ex and MMT of hip extensors.    Status On-going      PT LONG TERM GOAL #5   Title Pt will demonstrate full and pain free lumbar AROM for return to prior level of activity    Baseline can't flex    Status On-going                   Plan - 04/11/21 1045     Clinical Impression Statement Pt feels she has noted some improvements since ESI over the past week.  She is having signif less Lt LE pain with ongoing 2/10 central LBP that spreads Lt>Rt.  She does feel she is needing to be very cautious and protective of low back and is avoiding bending, lifting, and sitting when possible.  She has been able to sit and drive for 1-2 hours over last week with need for postural adjustments and min exacerbation of pain, which is an improvement from 20 min sitting tolerance before the injection.  Her Lt LE strength is 5/5.  She has observable atrophy over Lt L4-S1 multifidi and upper aspect of glut max over the Lt SI joint.  Tenderness is present along Lt SI joint and L4/L5, L5/S1 facets Lt>Rt.  Her FOTO score has improved by 1% to 52% with goal of 66%.  PT and Pt discussed her goals and plan for next steps with Pt planning to seek a 2nd injection and would like to take a few weeks break from  PT to see how injection treats her.  Pt has HEP for neutral spine strength training and understands symptom management strategies with modalities and positioning.  PT to place Pt on hold for now with plan for a reassessment when she is ready to resume PT after another ESI if MD agrees.    Personal Factors and Comorbidities Past/Current Experience    Examination-Activity Limitations Sit    Stability/Clinical  Decision Making Stable/Uncomplicated    Clinical Decision Making Low    Rehab Potential Excellent    PT Frequency 1x / week    PT Duration 8 weeks    PT Treatment/Interventions ADLs/Self Care Home Management;Cryotherapy;Electrical Stimulation;Moist Heat;Traction;Neuromuscular re-education;Therapeutic exercise;Therapeutic activities;Patient/family education;Manual techniques;Dry needling    PT Next Visit Plan Pt will be seeking 2nd injection (ESI) and wants to hold PT for a few weeks following injection.  Will call back to schedule another ERO when ready to resume PT.    PT Home Exercise Plan [phone]    Recommended Other Services taper to 1x/week after 2nd injection if Pt returns after being on hold    Consulted and Agree with Plan of Care Patient             Patient will benefit from skilled therapeutic intervention in order to improve the following deficits and impairments:  Decreased range of motion, Pain, Increased muscle spasms, Hypomobility, Impaired flexibility, Postural dysfunction, Decreased strength  Visit Diagnosis: Chronic left-sided low back pain with left-sided sciatica  Other muscle spasm     Problem List Patient Active Problem List   Diagnosis Date Noted   Chronic left-sided low back pain with left-sided sciatica 04/11/2018   HYPERLIPIDEMIA-MIXED 09/25/2010   TACHYCARDIA 11/19/2008   PALPITATIONS 11/19/2008    Anhelica Fowers, PT 04/11/21 11:11 AM  PHYSICAL THERAPY DISCHARGE SUMMARY  Visits from Start of Care: 12  Current functional level related to goals / functional outcomes: Pt wanted to hold PT and did not return to clinic within cert date.   Remaining deficits: See above   Education / Equipment: HEP and self care  Patient agrees to discharge. Patient goals were not met. Patient is being discharged due to not returning since the last visit.  Venetia Night Reginold Beale, PT 06/07/21 8:23 AM  [facility] [address] [place], [place], [address] Phone: [phone]   Fax:  [phone]  Name: Barbara Carlson MRN: [id number] Date of Birth: 07-01-1961

## 2021-04-14 ENCOUNTER — Encounter: Payer: 59 | Admitting: Physical Therapy

## 2021-05-01 ENCOUNTER — Other Ambulatory Visit: Payer: Self-pay

## 2021-05-02 ENCOUNTER — Encounter: Payer: Self-pay | Admitting: Family Medicine

## 2021-05-02 ENCOUNTER — Ambulatory Visit: Payer: 59 | Admitting: Family Medicine

## 2021-05-02 VITALS — BP 116/68 | HR 105 | Temp 98.3°F | Wt 140.0 lb

## 2021-05-02 DIAGNOSIS — R002 Palpitations: Secondary | ICD-10-CM

## 2021-05-02 NOTE — Patient Instructions (Signed)
Consider increasing the atenolol 25 mg one half tablet to TWICE daily to see if that blunts the palpitations and let me know .

## 2021-05-02 NOTE — Progress Notes (Signed)
Established Patient Office Visit  Subjective:  Patient ID: Barbara Carlson, female    DOB: 11-Nov-1960  Age: 60 y.o. MRN: [id number]  CC:  Chief Complaint  Patient presents with   Palpitations    Labor day weekend, started with small palpitations, went away, but came back and has soreness in chest.     HPI TACHE BOBST presents for palpitations.  She has longstanding history of intermittent palpitations.  Possibly correlated with stress in the past.  She does not use any significant amount of caffeine and no alcohol.  Does take occasional Sudafed.  Recent stress of helping to plan funeral arrangements for a friend.  She recalls that Sunday night prior to Labor Day she had what she described as "hard "palpitations lasting 15 to 30 minutes.  Little bit of pain with deep breathing that night and the next day noticed some soreness across her chest.  No recent exertional chest pains.  She then had another episode recently which was very similar.  She has had previous event monitoring which showed occasional PVCs.  Prior echo years ago was unremarkable.  She takes atenolol 25 mg 1/2 tablet once daily and usually takes this at night.  No recent dyspnea.  Denies any pleuritic pain at this time.  She has a Kardia monitor which has shown only occasional PVCs.  She brings in several strips today.  For the most part, this shows normal sinus rhythm.  Past Medical History:  Diagnosis Date   Asthma    as a child   Cancer ([place])    basal cell   Costochondritis, acute    couple years ago   Hyperlipidemia    diet controlled   Other dyspnea and respiratory abnormality    Palpitations    Post-operative nausea and vomiting    Tachycardia, unspecified     Past Surgical History:  Procedure Laterality Date   BASAL CELL CARCINOMA EXCISION  2004   right  abd   GANGLION CYST EXCISION  2015   right hand   REFRACTIVE SURGERY  2003   Bil   TONSILLECTOMY Bilateral 1987    Family History  Problem  Relation Age of Onset   Heart disease Mother        Atrial fibrillation   Colon polyps Mother    Cancer Father 79       lung   Hyperlipidemia Father    Hypertension Father    Colon cancer Maternal Aunt    Colon cancer Paternal Aunt     Social History   Socioeconomic History   Marital status: Married    Spouse name: Not on file   Number of children: Not on file   Years of education: Not on file   Highest education level: Not on file  Occupational History   Occupation: Marine scientist Admin    Employer: [employer]  Tobacco Use   Smoking status: Never   Smokeless tobacco: Never  Vaping Use   Vaping Use: Never used  Substance and Sexual Activity   Alcohol use: No   Drug use: No   Sexual activity: Not on file  Other Topics Concern   Not on file  Social History Narrative   Not on file   Social Determinants of Health   Financial Resource Strain: Not on file  Food Insecurity: Not on file  Transportation Needs: Not on file  Physical Activity: Not on file  Stress: Not on file  Social Connections: Not on file  Intimate Partner  Violence: Not on file    Outpatient Medications Prior to Visit  Medication Sig Dispense Refill   atenolol (TENORMIN) 25 MG tablet Take 0.5 tablets (12.5 mg total) by mouth daily. 45 tablet 3   Calcium Carb-Cholecalciferol (CALCIUM 600 + D PO) Take by mouth daily.     cetirizine (ZYRTEC) 10 MG tablet Take 10 mg by mouth daily.     Cholecalciferol (VITAMIN D3) 2000 units TABS Take by mouth daily.     COVID-19 At Home Antigen Test [facility] COVID-19 HOME TEST) KIT Use as directed 4 each 0   Multiple Vitamins-Calcium (ONE-A-DAY WOMENS PO) Take by mouth daily.     pseudoephedrine (SUDAFED) 30 MG tablet Take 30 mg by mouth daily.     rosuvastatin (CRESTOR) 10 MG tablet Take 1 tablet (10 mg total) by mouth daily. 90 tablet 3   vitamin C (ASCORBIC ACID) 500 MG tablet Take 500 mg by mouth daily.     Facility-Administered Medications Prior to Visit  Medication  Dose Route Frequency Provider Last Rate Last Admin   0.9 %  sodium chloride infusion  500 mL Intravenous Once Armbruster, Carlota Raspberry, MD        No Known Allergies  ROS Review of Systems  Constitutional:  Negative for appetite change, fever and unexpected weight change.  Respiratory:  Negative for shortness of breath.   Cardiovascular:  Positive for palpitations. Negative for chest pain.  Neurological:  Negative for dizziness and syncope.     Objective:    Physical Exam Vitals reviewed.  Constitutional:      Appearance: Normal appearance.  Cardiovascular:     Rate and Rhythm: Normal rate and regular rhythm.     Heart sounds: No murmur heard.   No gallop.  Pulmonary:     Effort: Pulmonary effort is normal.     Breath sounds: Normal breath sounds. No wheezing or rales.  Neurological:     Mental Status: She is alert.    BP 116/68 (BP Location: Right Arm, Patient Position: Sitting, Cuff Size: Normal)   Pulse (!) 105   Temp 98.3 F (36.8 C) (Oral)   Wt 140 lb (63.5 kg)   LMP 09/05/2010   SpO2 99%   BMI 24.80 kg/m  Wt Readings from Last 3 Encounters:  05/02/21 140 lb (63.5 kg)  03/22/21 143 lb 12.8 oz (65.2 kg)  03/16/21 141 lb 9.6 oz (64.2 kg)     Health Maintenance Due  Topic Date Due   HIV Screening  Never done   COVID-19 Vaccine (3 - Booster for Pfizer series) 02/06/2020   INFLUENZA VACCINE  03/13/2021   PAP SMEAR-Modifier  03/31/2021    There are no preventive care reminders to display for this patient.  Lab Results  Component Value Date   TSH 2.34 02/07/2021   Lab Results  Component Value Date   WBC 6.3 02/07/2021   HGB 12.8 02/07/2021   HCT 37.6 02/07/2021   MCV 88.9 02/07/2021   PLT 307.0 02/07/2021   Lab Results  Component Value Date   NA 138 02/07/2021   K 4.4 02/07/2021   CO2 27 02/07/2021   GLUCOSE 96 02/07/2021   BUN 17 02/07/2021   CREATININE 0.81 02/07/2021   BILITOT 0.6 02/07/2021   ALKPHOS 68 02/07/2021   AST 17 02/07/2021   ALT  15 02/07/2021   PROT 7.6 02/07/2021   ALBUMIN 4.6 02/07/2021   CALCIUM 9.8 02/07/2021   ANIONGAP 8 10/16/2020   GFR 79.54 02/07/2021   Lab Results  Component Value Date   CHOL 205 (H) 02/07/2021   Lab Results  Component Value Date   HDL 44.20 02/07/2021   No results found for: [facility] Lab Results  Component Value Date   TRIG 277.0 (H) 02/07/2021   Lab Results  Component Value Date   CHOLHDL 5 02/07/2021   No results found for: HGBA1C    Assessment & Plan:   Palpitations.  Past history of occasional PVCs.  Possibly triggered by recent stress.  -We did discuss increasing her atenolol 25 mg 1/2 tablet to twice daily because of relatively short half-life of atenolol. -Continue to avoid caffeine and try to minimize Sudafed intake if possible -Follow-up immediately for any increased dizziness, syncope, chest pains, or other concerns     Follow-up: No follow-ups on file.    Carolann Littler, MD

## 2021-05-04 ENCOUNTER — Other Ambulatory Visit (HOSPITAL_COMMUNITY): Payer: Self-pay

## 2021-05-08 ENCOUNTER — Other Ambulatory Visit (HOSPITAL_COMMUNITY): Payer: Self-pay

## 2021-05-17 DIAGNOSIS — M5413 Radiculopathy, cervicothoracic region: Secondary | ICD-10-CM | POA: Diagnosis not present

## 2021-05-17 DIAGNOSIS — M9901 Segmental and somatic dysfunction of cervical region: Secondary | ICD-10-CM | POA: Diagnosis not present

## 2021-05-19 DIAGNOSIS — M9901 Segmental and somatic dysfunction of cervical region: Secondary | ICD-10-CM | POA: Diagnosis not present

## 2021-05-19 DIAGNOSIS — M5413 Radiculopathy, cervicothoracic region: Secondary | ICD-10-CM | POA: Diagnosis not present

## 2021-05-23 DIAGNOSIS — M9901 Segmental and somatic dysfunction of cervical region: Secondary | ICD-10-CM | POA: Diagnosis not present

## 2021-05-23 DIAGNOSIS — M5413 Radiculopathy, cervicothoracic region: Secondary | ICD-10-CM | POA: Diagnosis not present

## 2021-05-26 ENCOUNTER — Other Ambulatory Visit: Payer: Self-pay | Admitting: Radiology

## 2021-05-26 DIAGNOSIS — M9901 Segmental and somatic dysfunction of cervical region: Secondary | ICD-10-CM | POA: Diagnosis not present

## 2021-05-26 DIAGNOSIS — M5413 Radiculopathy, cervicothoracic region: Secondary | ICD-10-CM | POA: Diagnosis not present

## 2021-05-26 DIAGNOSIS — Z1231 Encounter for screening mammogram for malignant neoplasm of breast: Secondary | ICD-10-CM

## 2021-05-29 DIAGNOSIS — M9901 Segmental and somatic dysfunction of cervical region: Secondary | ICD-10-CM | POA: Diagnosis not present

## 2021-05-29 DIAGNOSIS — M5413 Radiculopathy, cervicothoracic region: Secondary | ICD-10-CM | POA: Diagnosis not present

## 2021-05-31 DIAGNOSIS — M9901 Segmental and somatic dysfunction of cervical region: Secondary | ICD-10-CM | POA: Diagnosis not present

## 2021-05-31 DIAGNOSIS — M5413 Radiculopathy, cervicothoracic region: Secondary | ICD-10-CM | POA: Diagnosis not present

## 2021-06-06 DIAGNOSIS — M9901 Segmental and somatic dysfunction of cervical region: Secondary | ICD-10-CM | POA: Diagnosis not present

## 2021-06-06 DIAGNOSIS — M5413 Radiculopathy, cervicothoracic region: Secondary | ICD-10-CM | POA: Diagnosis not present

## 2021-06-08 DIAGNOSIS — M5413 Radiculopathy, cervicothoracic region: Secondary | ICD-10-CM | POA: Diagnosis not present

## 2021-06-08 DIAGNOSIS — M9901 Segmental and somatic dysfunction of cervical region: Secondary | ICD-10-CM | POA: Diagnosis not present

## 2021-06-13 DIAGNOSIS — M5413 Radiculopathy, cervicothoracic region: Secondary | ICD-10-CM | POA: Diagnosis not present

## 2021-06-13 DIAGNOSIS — M9901 Segmental and somatic dysfunction of cervical region: Secondary | ICD-10-CM | POA: Diagnosis not present

## 2021-06-19 ENCOUNTER — Encounter: Payer: Self-pay | Admitting: Family Medicine

## 2021-06-19 DIAGNOSIS — M5413 Radiculopathy, cervicothoracic region: Secondary | ICD-10-CM | POA: Diagnosis not present

## 2021-06-19 DIAGNOSIS — M9901 Segmental and somatic dysfunction of cervical region: Secondary | ICD-10-CM | POA: Diagnosis not present

## 2021-06-20 ENCOUNTER — Other Ambulatory Visit (HOSPITAL_COMMUNITY): Payer: Self-pay

## 2021-06-20 MED ORDER — METOPROLOL SUCCINATE ER 50 MG PO TB24
50.0000 mg | ORAL_TABLET | Freq: Every day | ORAL | 5 refills | Status: DC
  Filled 2021-06-20: qty 30, 30d supply, fill #0
  Filled 2021-07-19: qty 30, 30d supply, fill #1
  Filled 2021-08-17: qty 30, 30d supply, fill #2
  Filled 2021-09-19: qty 30, 30d supply, fill #3
  Filled 2021-10-18: qty 30, 30d supply, fill #4
  Filled 2021-11-16: qty 30, 30d supply, fill #5

## 2021-06-21 DIAGNOSIS — M9901 Segmental and somatic dysfunction of cervical region: Secondary | ICD-10-CM | POA: Diagnosis not present

## 2021-06-21 DIAGNOSIS — M5413 Radiculopathy, cervicothoracic region: Secondary | ICD-10-CM | POA: Diagnosis not present

## 2021-06-26 ENCOUNTER — Ambulatory Visit
Admission: RE | Admit: 2021-06-26 | Discharge: 2021-06-26 | Disposition: A | Discharge status: Home / Self Care | Payer: 59 | Source: Ambulatory Visit

## 2021-06-26 ENCOUNTER — Other Ambulatory Visit: Payer: Self-pay

## 2021-06-26 DIAGNOSIS — Z1231 Encounter for screening mammogram for malignant neoplasm of breast: Secondary | ICD-10-CM | POA: Diagnosis not present

## 2021-06-26 IMAGING — MG MM DIGITAL SCREENING BILAT W/ TOMO AND CAD
8 series · 9 of 24 positions shown · non-contrast
Comparison: Previous exam(s).

CLINICAL DATA: Screening.

EXAM:
DIGITAL SCREENING BILATERAL MAMMOGRAM WITH TOMOSYNTHESIS AND CAD
TECHNIQUE: Bilateral screening digital craniocaudal and mediolateral oblique
mammograms were obtained. Bilateral screening digital breast
tomosynthesis was performed. The images were evaluated with
computer-aided detection.

[L CC synth-2D]
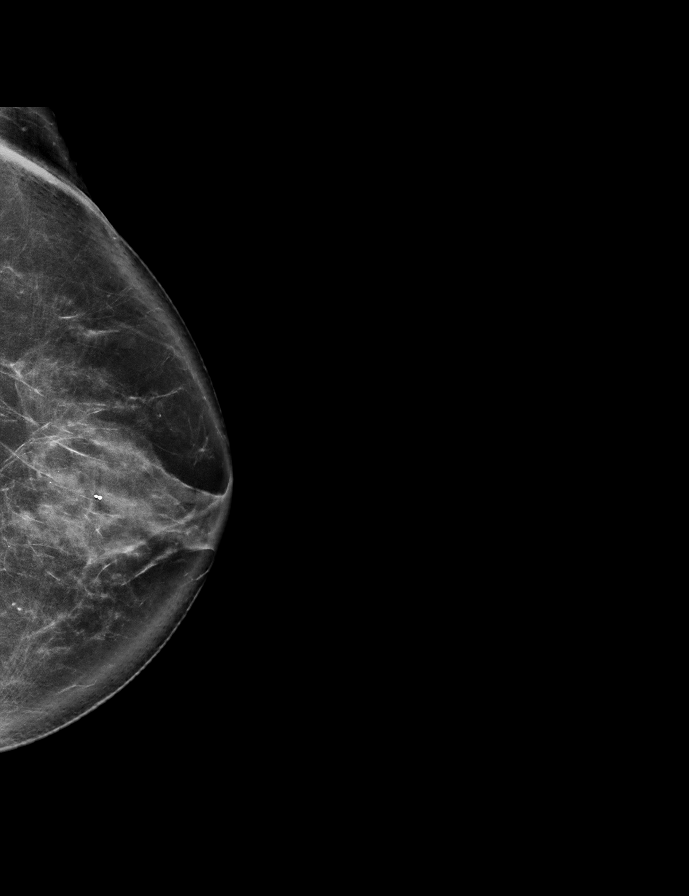

[L MLO synth-2D]
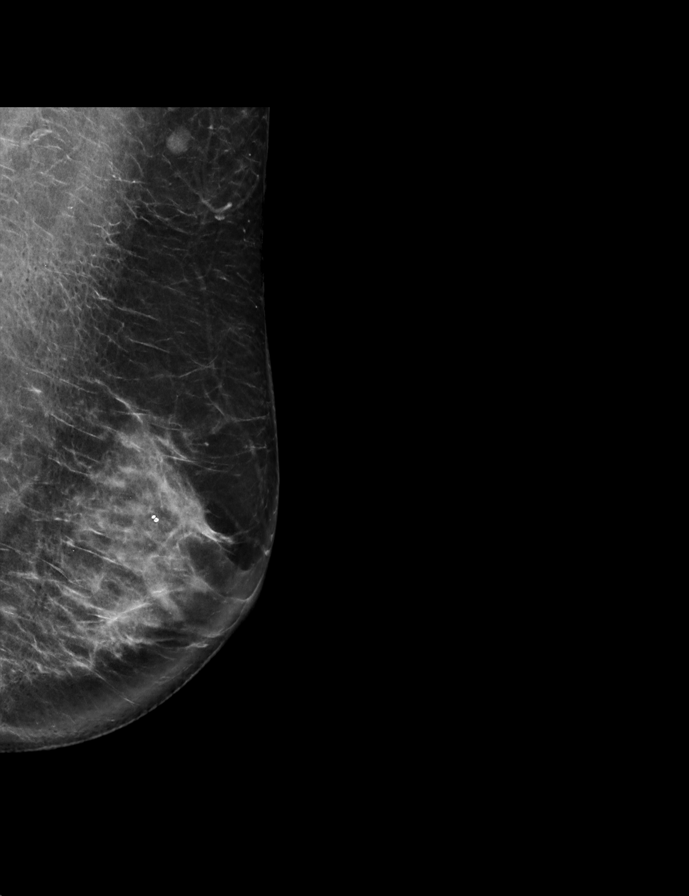

[R CC synth-2D]
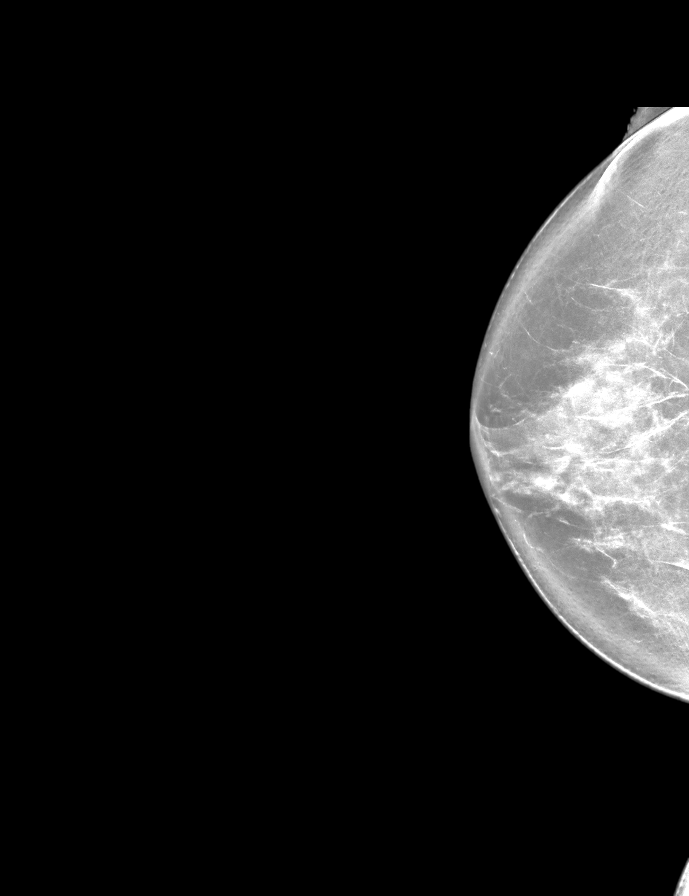

[R MLO synth-2D]
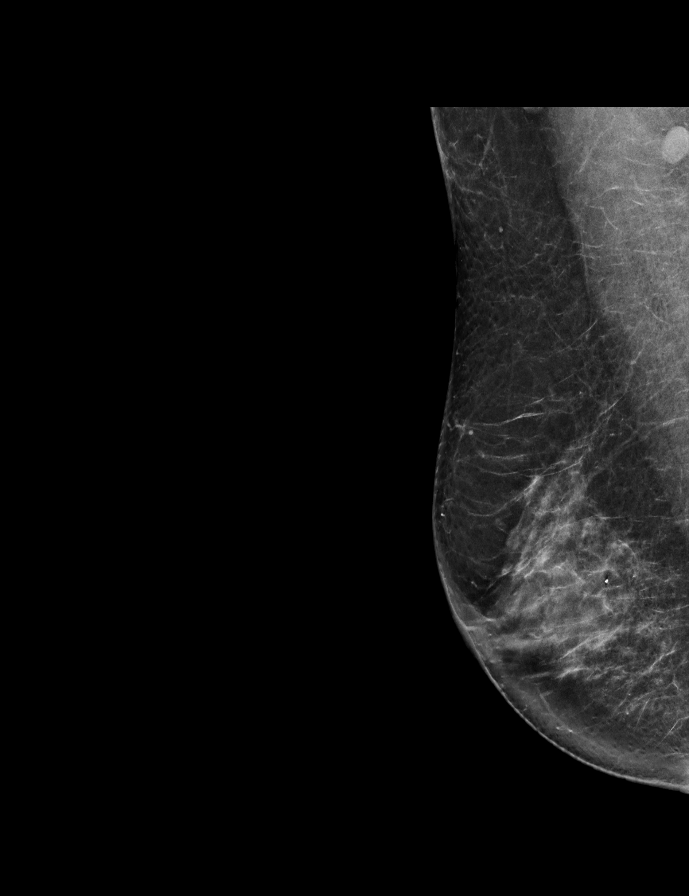

[L CC tomo · 2 of 81 frames shown]
[frame 27/81]
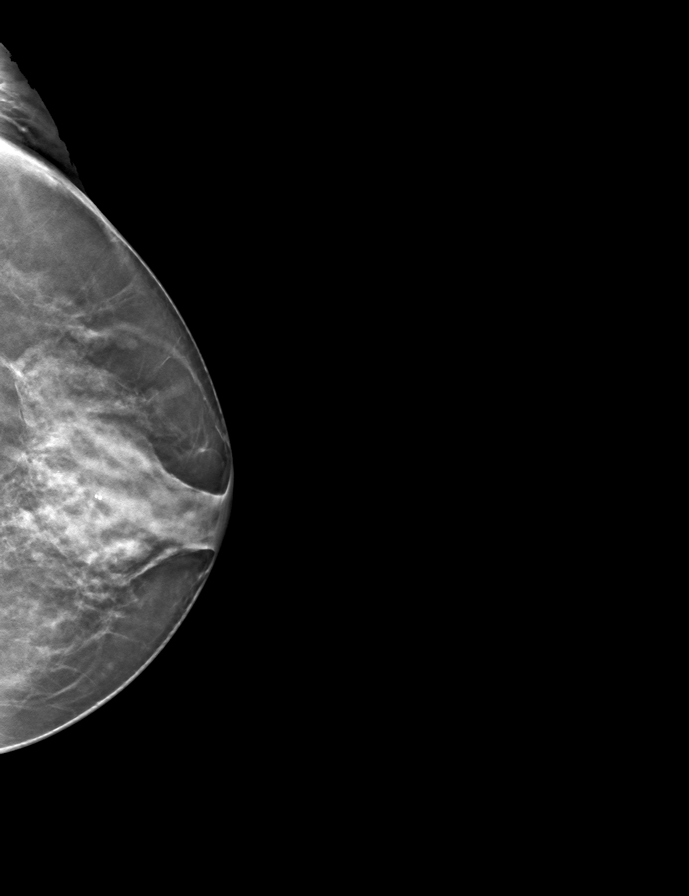
[frame 41/81]
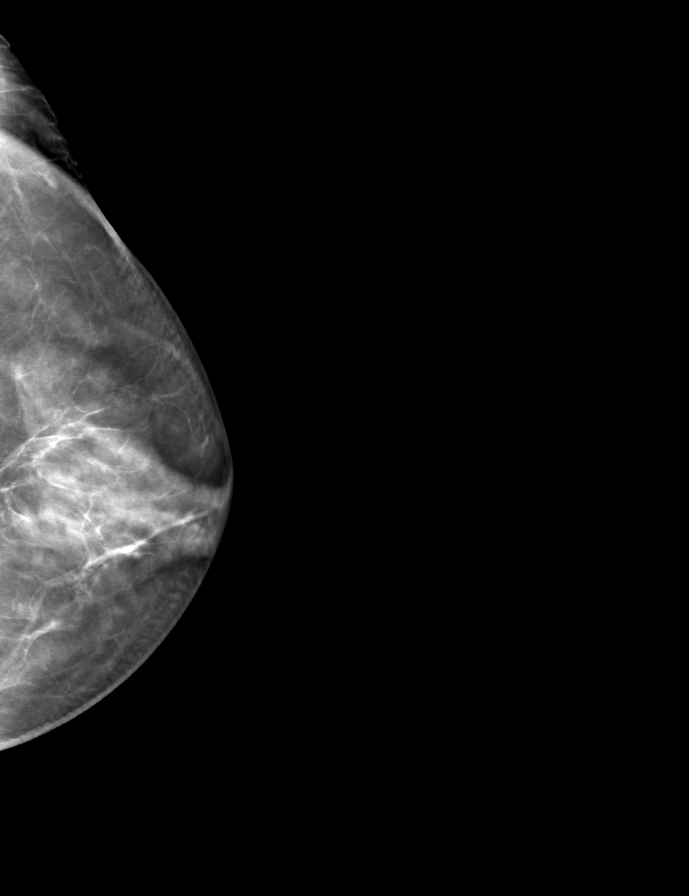

[R MLO tomo · tomo slice 37/74.0]
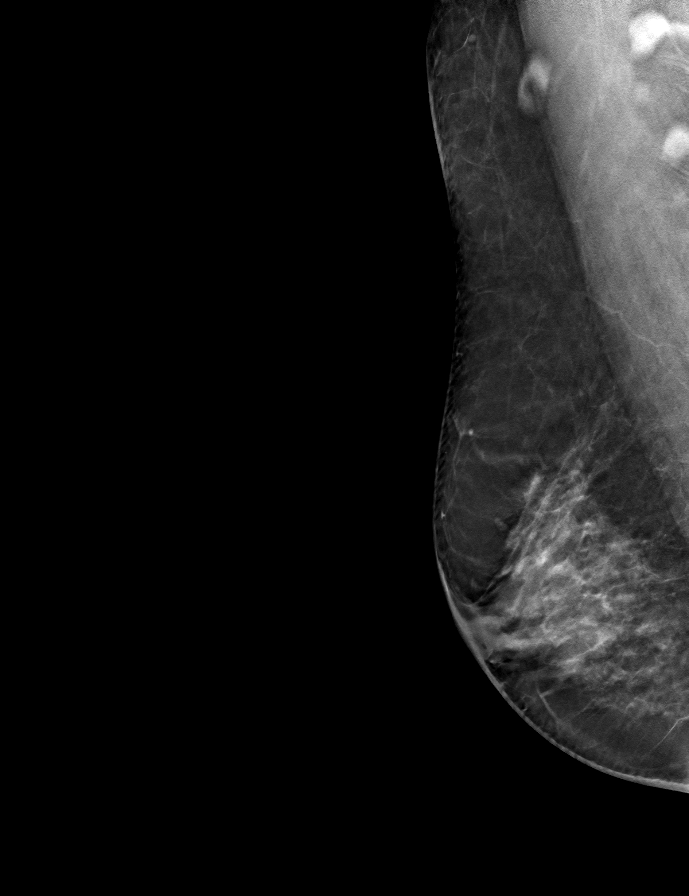

[L MLO tomo · tomo slice 39/77.0]
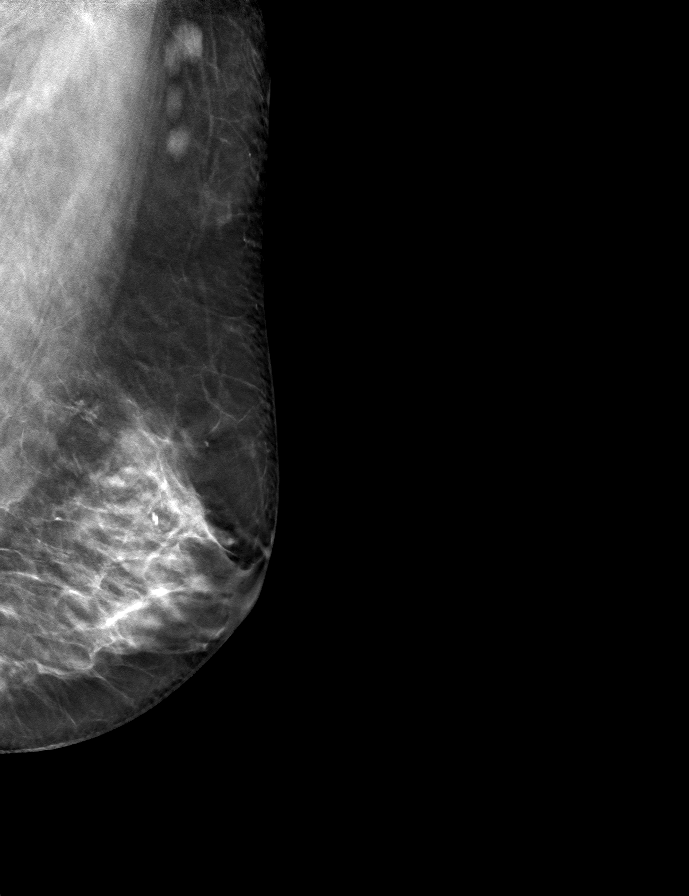

[R CC tomo · tomo slice 41/80.0]
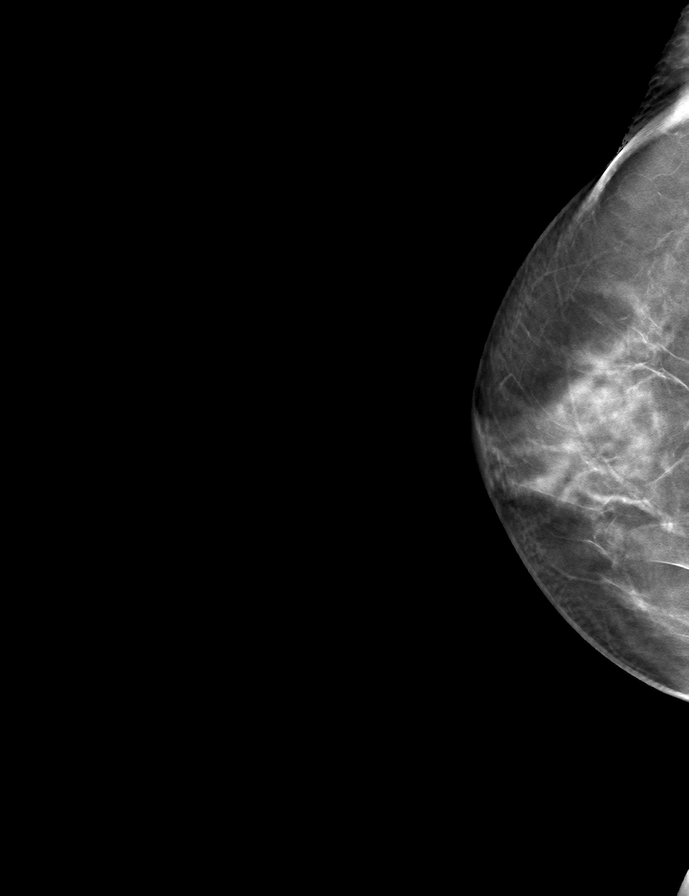

[9 of 24 positions shown; findings below may reference images not displayed]

ACR Breast Density Category c: The breast tissue is heterogeneously
dense, which may obscure small masses.
FINDINGS: In the left breast, a possible mass warrants further evaluation. In
the right breast, no findings suspicious for malignancy.
IMPRESSION: Further evaluation is suggested for a possible mass in the left
breast.

RECOMMENDATION:
Diagnostic mammogram and possibly ultrasound of the left breast.
(Code:[9W])

The patient will be contacted regarding the findings, and additional
imaging will be scheduled.

BI-RADS CATEGORY  0: Incomplete. Need additional imaging evaluation
and/or prior mammograms for comparison.

## 2021-06-27 DIAGNOSIS — D225 Melanocytic nevi of trunk: Secondary | ICD-10-CM | POA: Diagnosis not present

## 2021-06-27 DIAGNOSIS — L821 Other seborrheic keratosis: Secondary | ICD-10-CM | POA: Diagnosis not present

## 2021-06-27 DIAGNOSIS — D235 Other benign neoplasm of skin of trunk: Secondary | ICD-10-CM | POA: Diagnosis not present

## 2021-06-27 DIAGNOSIS — L578 Other skin changes due to chronic exposure to nonionizing radiation: Secondary | ICD-10-CM | POA: Diagnosis not present

## 2021-06-27 DIAGNOSIS — D485 Neoplasm of uncertain behavior of skin: Secondary | ICD-10-CM | POA: Diagnosis not present

## 2021-06-27 DIAGNOSIS — D2272 Melanocytic nevi of left lower limb, including hip: Secondary | ICD-10-CM | POA: Diagnosis not present

## 2021-06-27 DIAGNOSIS — Z86018 Personal history of other benign neoplasm: Secondary | ICD-10-CM | POA: Diagnosis not present

## 2021-06-27 DIAGNOSIS — D2261 Melanocytic nevi of right upper limb, including shoulder: Secondary | ICD-10-CM | POA: Diagnosis not present

## 2021-06-27 DIAGNOSIS — Z85828 Personal history of other malignant neoplasm of skin: Secondary | ICD-10-CM | POA: Diagnosis not present

## 2021-06-28 ENCOUNTER — Other Ambulatory Visit (HOSPITAL_COMMUNITY): Payer: Self-pay

## 2021-06-28 ENCOUNTER — Ambulatory Visit (INDEPENDENT_AMBULATORY_CARE_PROVIDER_SITE_OTHER): Payer: 59 | Admitting: Family Medicine

## 2021-06-28 ENCOUNTER — Other Ambulatory Visit: Payer: Self-pay | Admitting: Physician Assistant

## 2021-06-28 ENCOUNTER — Other Ambulatory Visit: Payer: Self-pay | Admitting: Radiology

## 2021-06-28 ENCOUNTER — Other Ambulatory Visit: Payer: Self-pay

## 2021-06-28 VITALS — BP 114/78 | HR 104 | Ht 63.0 in | Wt 141.2 lb

## 2021-06-28 DIAGNOSIS — G8929 Other chronic pain: Secondary | ICD-10-CM | POA: Diagnosis not present

## 2021-06-28 DIAGNOSIS — M5412 Radiculopathy, cervical region: Secondary | ICD-10-CM

## 2021-06-28 DIAGNOSIS — M545 Low back pain, unspecified: Secondary | ICD-10-CM | POA: Diagnosis not present

## 2021-06-28 DIAGNOSIS — R928 Other abnormal and inconclusive findings on diagnostic imaging of breast: Secondary | ICD-10-CM

## 2021-06-28 MED ORDER — PREDNISONE 50 MG PO TABS
50.0000 mg | ORAL_TABLET | Freq: Every day | ORAL | 0 refills | Status: DC
  Filled 2021-06-28: qty 5, 5d supply, fill #0

## 2021-06-28 NOTE — Progress Notes (Signed)
I, Barbara Carlson, LAT, ATC acting as a scribe for Barbara Leader, MD.  Barbara Carlson is a 60 y.o. female who presents to [place] [facility] today for continued L-sided lumbar radiculopathy at L5 and neck pain. Pt was last seen by Dr. Georgina Snell on 03/22/21 and was advised to proceed to MRI and was prescribed Gabapentin. After MRI, pt was referred for an ESI that was performed on 03/24/21. Today, pt reports her low back started hurting her again on Sunday when getting the Christmas tree out of the attic.   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 Pt locates pain to the L side of her neck. Pt had been seeing a chiro and was told she has intervertebral narrowing.  She has had extensive chiropractic treatment since early September for this with only mild improvement.  Her major issue today is not her back pain but her left-sided neck pain radiating down her left arm.  Radiates: yes- across superior aspect of L GH and distally to forearm UE numbness/tingling: no UE weakness: no Aggravates: driving, sitting in church, sitting Treatments tried: chiro treatments, stretching  Dx imaging: 03/19/21 L-spine MRI  L-spine XR at chiro office in Feb 2022  Pertinent review of systems: No fevers or chills  Relevant historical information: Hyperlipidemia.  History of lumbar radiculopathy.   Exam:  BP 114/78   Pulse (!) 104   Ht 5\' 3"  (1.6 m)   Wt 141 lb 3.2 oz (64 kg)   LMP 09/05/2010   SpO2 98%   BMI 25.01 kg/m  General: Well Developed, well nourished, and in no acute distress.   MSK: C-spine: Normal. Nontender midline. Normal cervical motion. Positive Spurling's test. Upper extremity strength reflexes and sensation are equal and normal throughout.    Lab and Radiology Results X-rays obtained at the chiropractor office images provided by patient interpreted personally and independent today show DDD C5-C6 with loss of cervical lordosis without acute  fractures       Assessment and Plan: 60 y.o. female with left sided cervical radiculopathy very likely C6 based on dermatomal pattern and x-ray findings as above.  Plan for MRI.  Patient has had more than 6 weeks of chiropractor directed conservative management at this point and on x-ray.  Chiropractor conservative management strategies started in early September.  She certainly has signs and symptoms consistent with C6 cervical radiculopathy.  MRI should help confirm diagnosis and plan for epidural steroid injection treatment. Short course of prednisone now.  We discussed gabapentin.  She has some at home that she will try. Recheck after MRI.  Back pain mild returning or exacerbation of low back pain without significant radiculopathy at this time.  Recommend against repeat lumbar epidural steroid injection until symptoms worsen further.  PDMP not reviewed this encounter. Orders Placed This Encounter  Procedures   MR CERVICAL SPINE WO CONTRAST    Standing Status:   Future    Standing Expiration Date:   06/28/2022    Order Specific Question:   What is the patient's sedation requirement?    Answer:   No Sedation    Order Specific Question:   Does the patient have a pacemaker or implanted devices?    Answer:   No    Order Specific Question:   Preferred imaging location?    Answer:   Product/process development scientist (table limit-350lbs)   Meds ordered this encounter  Medications   predniSONE (DELTASONE) 50 MG tablet  Sig: Take 1 tablet (50 mg total) by mouth daily.    Dispense:  5 tablet    Refill:  0     Discussed warning signs or symptoms. Please see discharge instructions. Patient expresses understanding.   The above documentation has been reviewed and is accurate and complete Barbara Carlson, M.D.

## 2021-06-28 NOTE — Patient Instructions (Signed)
Thank you for coming in today.   You should hear from MRI scheduling within 1 week. If you do not hear please let me know.    Prednisone.   Likely plan for Epidural if not better with oral steroids.

## 2021-06-29 DIAGNOSIS — M5412 Radiculopathy, cervical region: Secondary | ICD-10-CM | POA: Insufficient documentation

## 2021-07-01 ENCOUNTER — Other Ambulatory Visit: Payer: Self-pay

## 2021-07-01 ENCOUNTER — Ambulatory Visit (INDEPENDENT_AMBULATORY_CARE_PROVIDER_SITE_OTHER): Payer: 59

## 2021-07-01 DIAGNOSIS — M4802 Spinal stenosis, cervical region: Secondary | ICD-10-CM | POA: Diagnosis not present

## 2021-07-01 DIAGNOSIS — M5412 Radiculopathy, cervical region: Secondary | ICD-10-CM | POA: Diagnosis not present

## 2021-07-01 DIAGNOSIS — M25512 Pain in left shoulder: Secondary | ICD-10-CM | POA: Diagnosis not present

## 2021-07-01 DIAGNOSIS — M50121 Cervical disc disorder at C4-C5 level with radiculopathy: Secondary | ICD-10-CM | POA: Diagnosis not present

## 2021-07-01 DIAGNOSIS — M50123 Cervical disc disorder at C6-C7 level with radiculopathy: Secondary | ICD-10-CM | POA: Diagnosis not present

## 2021-07-01 IMAGING — MR MR CERVICAL SPINE W/O CM
5 series · 29 of 48 positions shown · non-contrast
Comparison: None.

CLINICAL DATA: Cervical radiculitis.  Neck and left shoulder pain.

EXAM:
MRI CERVICAL SPINE WITHOUT CONTRAST
TECHNIQUE: Multiplanar, multisequence MR imaging of the cervical spine was
performed. No intravenous contrast was administered.

[Series 2: T2 · sagittal · 3.0mm · 0.69mm/px · 6 of 13 slices shown (1 of 2)]
[im 1/13]
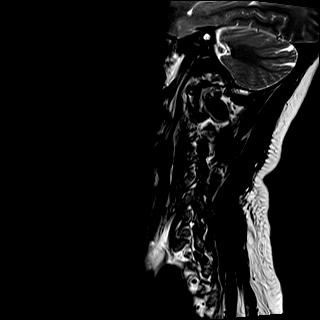
[im 3/13]
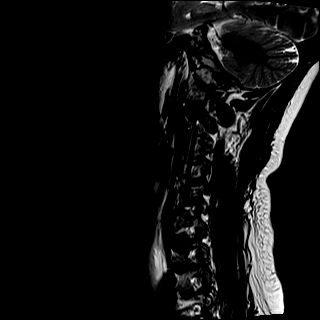
[im 5/13]
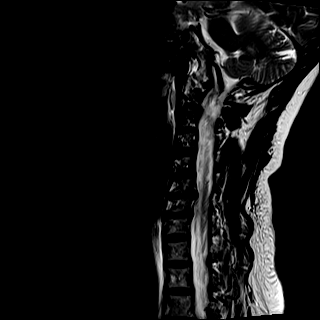
[im 8/13]
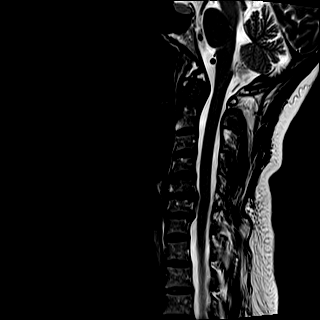
[im 10/13]
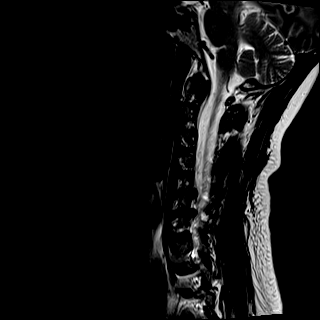
[im 13/13]
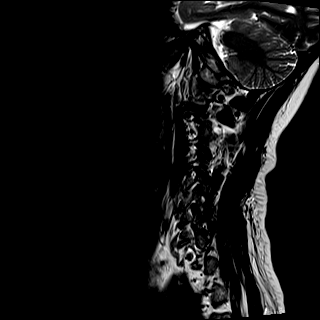

[Series 3: T1 · sagittal · 3.0mm · 0.86mm/px · 6 of 13 slices shown]
[im 1/13]
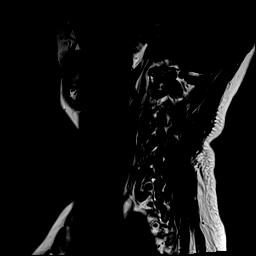
[im 3/13]
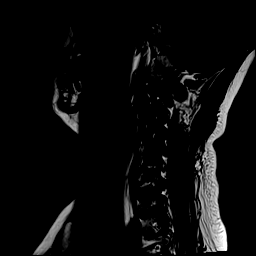
[im 5/13]
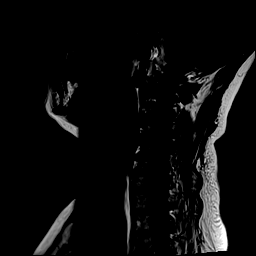
[im 8/13]
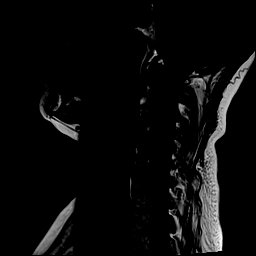
[im 10/13]
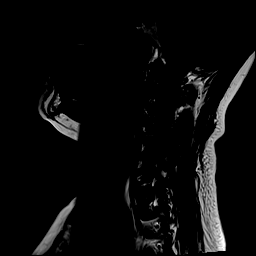
[im 13/13]
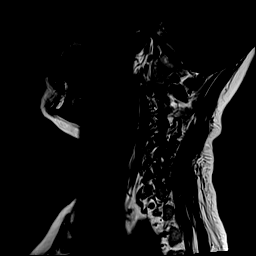

[Series 4: STIR · sagittal · 3.0mm · 0.34mm/px · 6 of 13 slices shown]
[im 1/13]
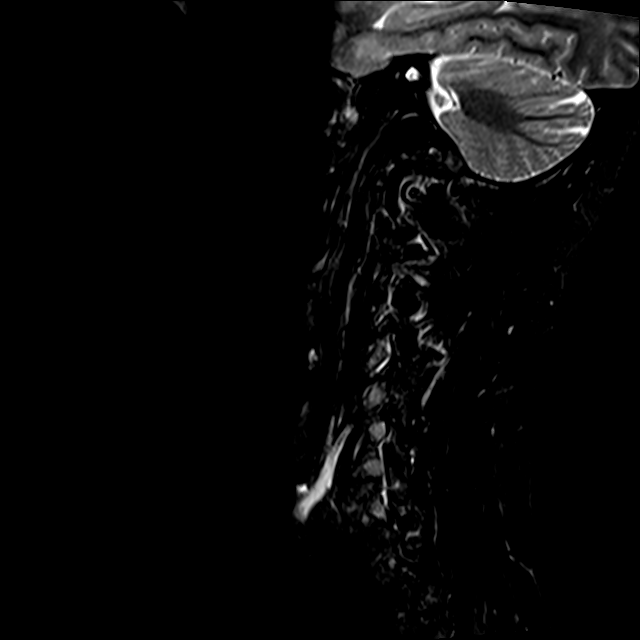
[im 3/13]
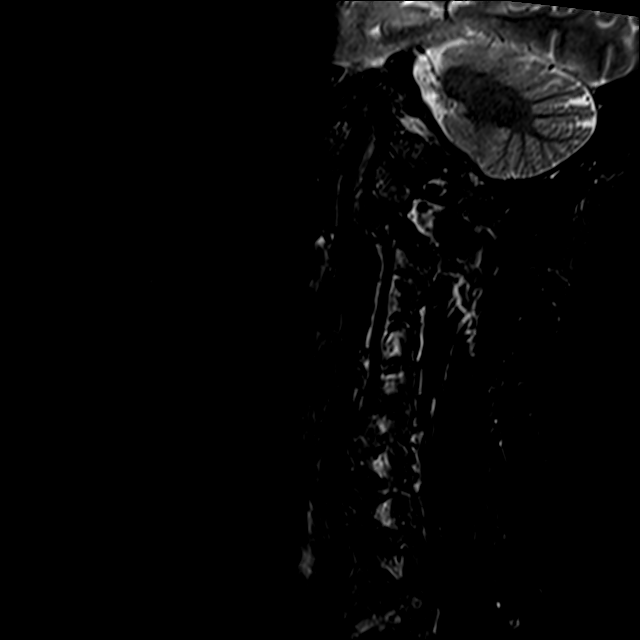
[im 5/13]
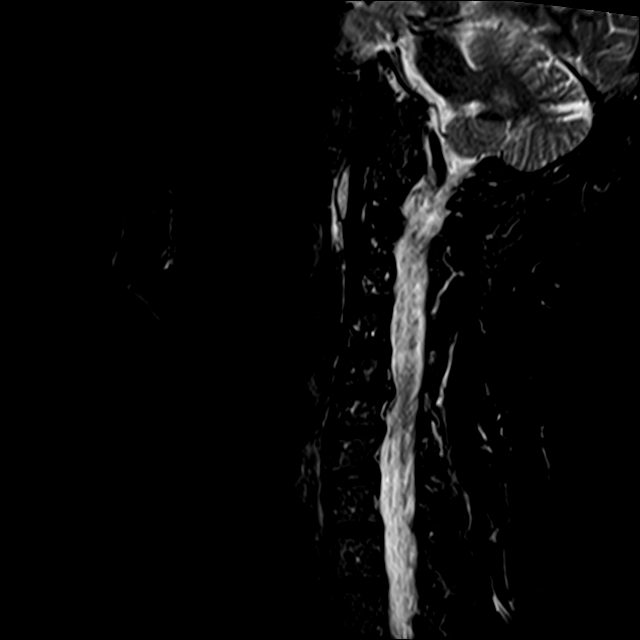
[im 8/13]
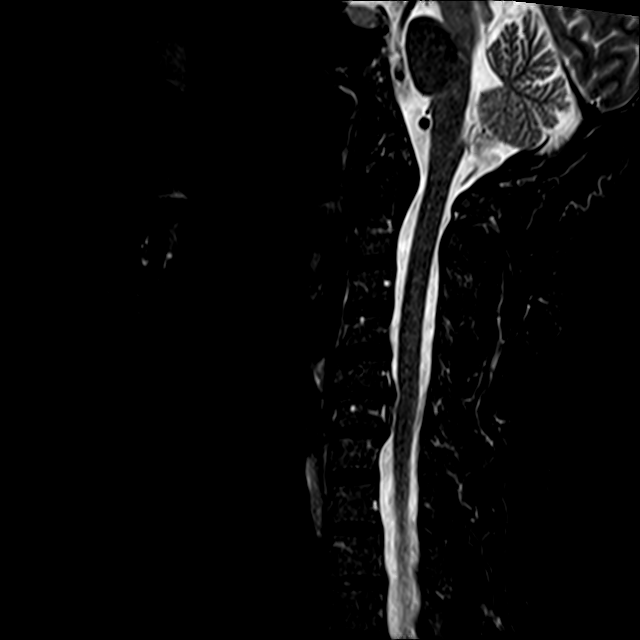
[im 10/13]
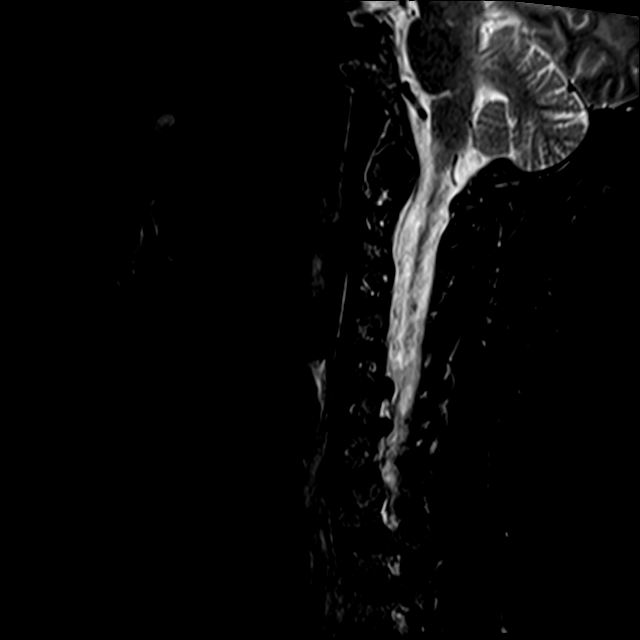
[im 13/13]
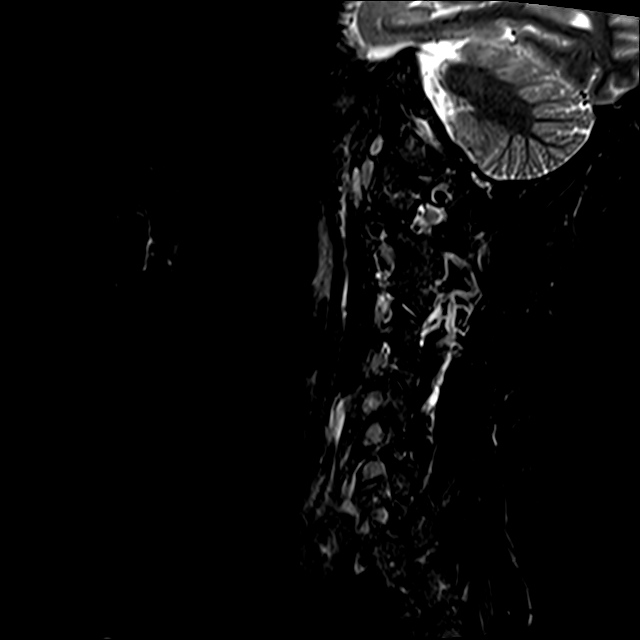

[Series 5: T2 · axial · 3.0mm · 0.56mm/px · z∈[-40,+69]mm · 9 of 34 slices shown (2 of 2)]
[im 1/34]
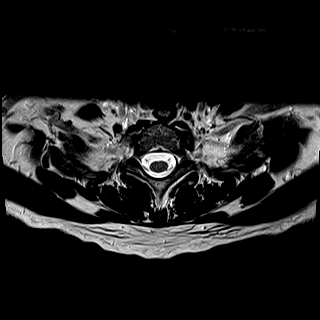
[im 5/34]
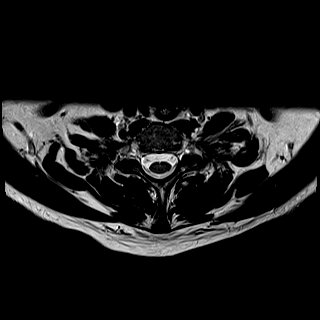
[im 10/34]
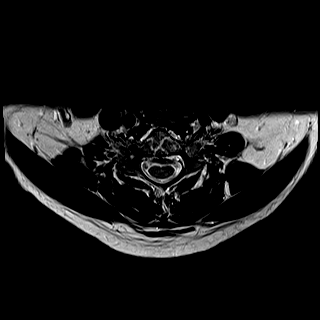
[im 15/34]
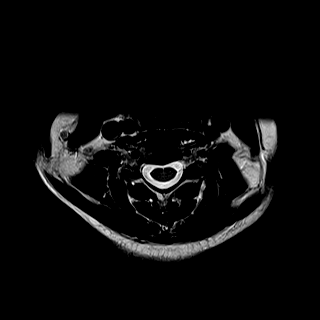
[im 17/34]
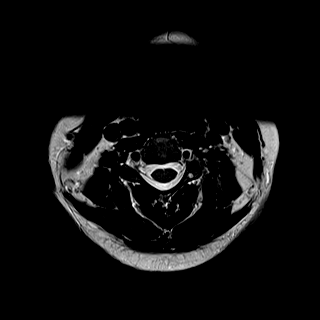
[im 19/34]
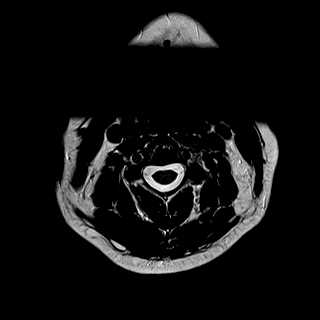
[im 24/34]
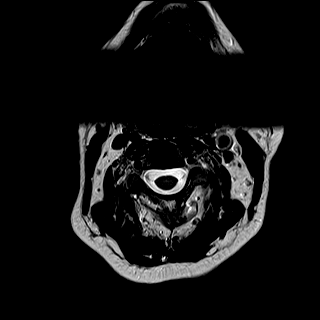
[im 29/34]
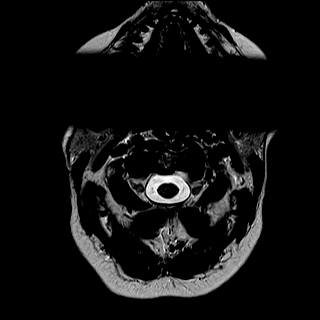
[im 34/34]
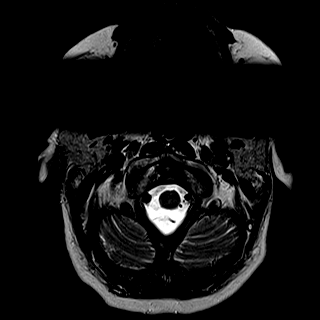

[Series 6: mpgr ax · axial · 3.0mm · 0.35mm/px · z∈[-40,-27]mm · 2 of 34 slices shown]
[im 1/34]
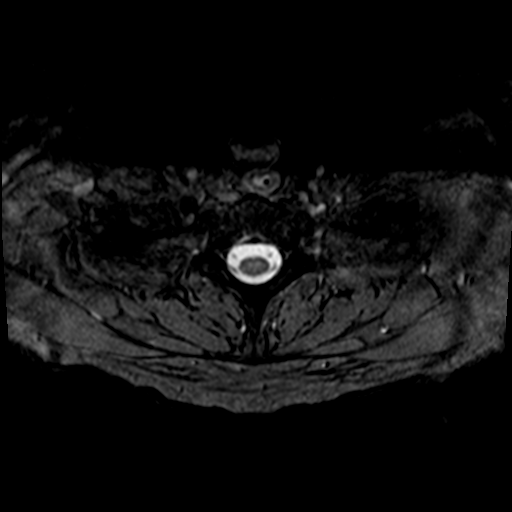
[im 5/34]
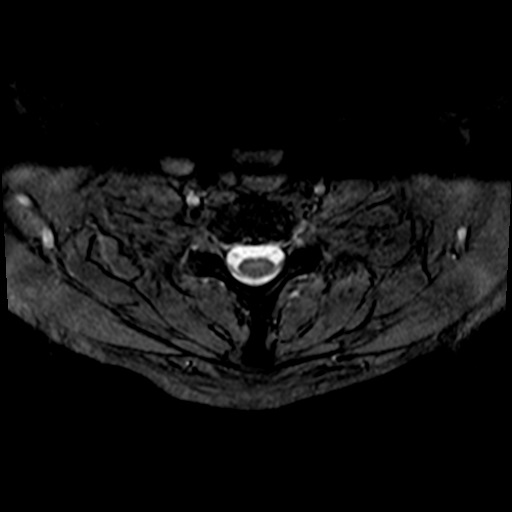

[29 of 48 positions shown; findings below may reference images not displayed]

FINDINGS: Alignment: Cervical spine straightening.  No significant listhesis.

Vertebrae: No fracture, suspicious marrow lesion, or significant
marrow edema.

Cord: Normal signal.

Posterior Fossa, vertebral arteries, paraspinal tissues:
Unremarkable.

Disc levels:

C2-3: Asymmetric left facet arthrosis with facet ankylosis. No disc
herniation or stenosis.

C3-4: Negative.

C4-5: Minimal disc bulging and facet arthrosis without stenosis.

C5-6: Moderate disc space narrowing. Broad-based posterior disc
osteophyte complex results in mild left greater than right neural
foraminal stenosis and slight ventral cord flattening without
significant spinal stenosis.

C6-7: Moderate disc space narrowing. Disc bulging, uncovertebral
spurring, and a left foraminal disc protrusion result in severe left
neural foraminal stenosis and likely left C7 nerve root impingement.
Patent spinal canal and right neural foramen.

C7-T1: Minimal facet arthrosis without disc herniation or stenosis.
IMPRESSION: 1. Left foraminal disc protrusion at C6-7 with severe left neural
foraminal stenosis and C7 nerve root impingement.
2. Mild neural foraminal stenosis at C5-6.

## 2021-07-04 ENCOUNTER — Telehealth: Payer: Self-pay | Admitting: Family Medicine

## 2021-07-04 DIAGNOSIS — M5412 Radiculopathy, cervical region: Secondary | ICD-10-CM

## 2021-07-04 NOTE — Progress Notes (Signed)
MRI cervical spine shows a bulging disc at C6-7 pressing on the left C7 nerve root which is very likely causing your left arm pain.  The radiologist reads this as severe. Plan for epidural steroid injection.  I have already ordered this injection.  Please call [facility] at [phone] to schedule this. Please let me know if not improved after the injection.  If you would like to schedule a follow-up appointment to go over the results in further detail that would be fine.

## 2021-07-04 NOTE — Telephone Encounter (Signed)
Epidural steroid injection ordered 

## 2021-07-12 ENCOUNTER — Ambulatory Visit
Admission: RE | Admit: 2021-07-12 | Discharge: 2021-07-12 | Disposition: A | Discharge status: Home / Self Care | Payer: 59 | Source: Ambulatory Visit | Attending: Family Medicine | Admitting: Family Medicine

## 2021-07-12 DIAGNOSIS — M5412 Radiculopathy, cervical region: Secondary | ICD-10-CM

## 2021-07-12 DIAGNOSIS — M50223 Other cervical disc displacement at C6-C7 level: Secondary | ICD-10-CM | POA: Diagnosis not present

## 2021-07-12 IMAGING — XA DG INJECT/[PERSON_NAME] INC NEEDLE/CATH/PLC EPI/CERV/THOR W/IMG
2 series · 2 of 2 positions shown · non-contrast
Comparison: none

CLINICAL DATA: Left neck and shoulder pain. Displacement of the
C5-6 and C6-7 cervical discs.

[Series 1: ortho standard · 1 of 1 slices shown (1 of 2)]
[im 1/1]
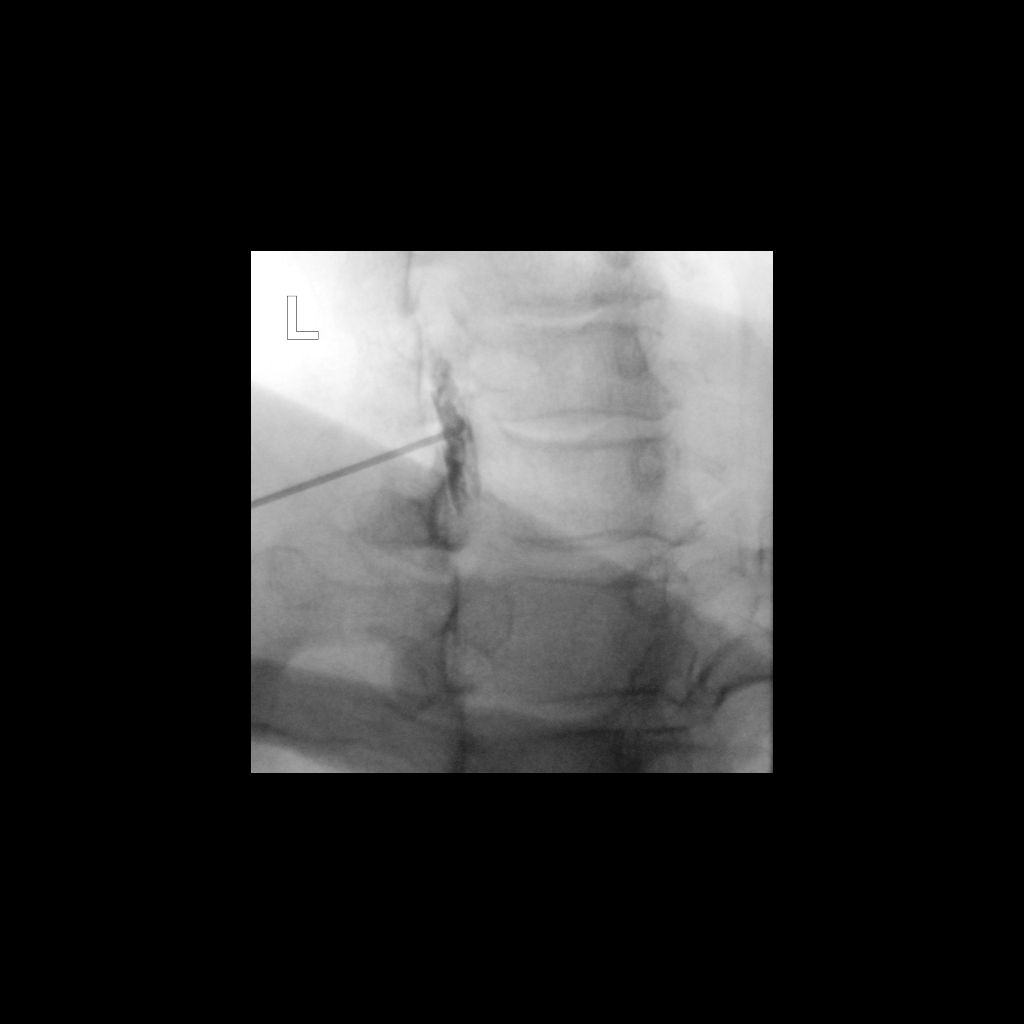

[Series 2: ortho standard · 1 of 1 slices shown (2 of 2)]
[im 1/1]
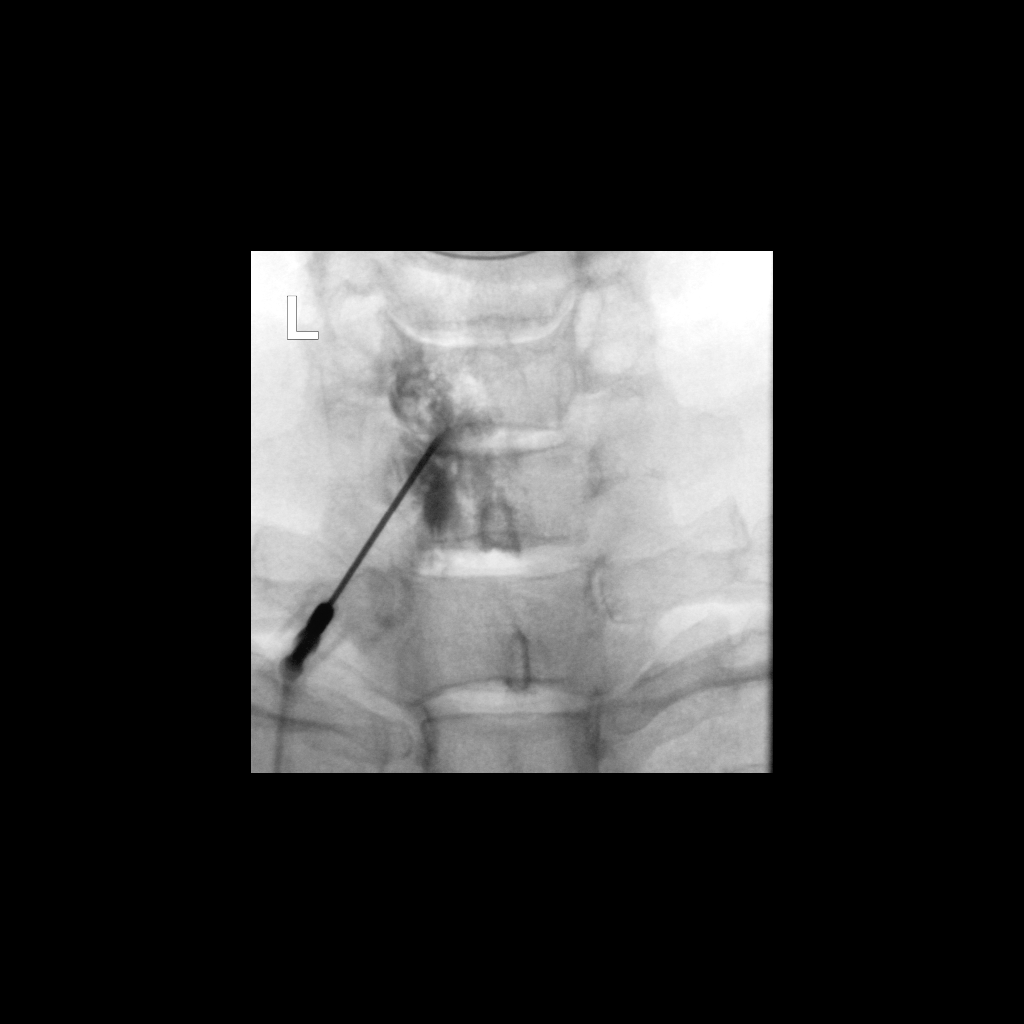

[2 of 2 positions shown; findings below may reference images not displayed]

FLUOROSCOPY TIME:  Radiation Exposure Index (as provided by the
fluoroscopic device): 10.84 uGy*m2

PROCEDURE:
CERVICAL EPIDURAL INJECTION

An interlaminar approach was performed on the left at C6-7. A 20
gauge epidural needle was advanced using loss-of-resistance
technique.

DIAGNOSTIC EPIDURAL INJECTION

Injection of Isovue-M 300 shows a good epidural pattern with spread
above and below the level of needle placement, primarily on the
left. No vascular opacification is seen. THERAPEUTIC

EPIDURAL INJECTION

1.5 ml of Kenalog 40 mixed with 1 ml of 1% Lidocaine and 2 ml of
normal saline were then instilled. The procedure was well-tolerated,
and the patient was discharged thirty minutes following the
injection in good condition.
IMPRESSION: Technically successful first epidural injection on the left at C6-7.

## 2021-07-12 MED ORDER — IOPAMIDOL (ISOVUE-M 300) INJECTION 61%
1.0000 mL | Freq: Once | INTRAMUSCULAR | Status: AC
  Administered 2021-07-12: 1 mL via EPIDURAL

## 2021-07-12 MED ORDER — TRIAMCINOLONE ACETONIDE 40 MG/ML IJ SUSP (RADIOLOGY)
60.0000 mg | Freq: Once | INTRAMUSCULAR | Status: AC
  Administered 2021-07-12: 60 mg via EPIDURAL

## 2021-07-12 NOTE — Discharge Instructions (Signed)

## 2021-07-19 ENCOUNTER — Other Ambulatory Visit (HOSPITAL_COMMUNITY): Payer: Self-pay

## 2021-07-26 ENCOUNTER — Other Ambulatory Visit: Payer: Self-pay | Admitting: Family Medicine

## 2021-07-26 DIAGNOSIS — R928 Other abnormal and inconclusive findings on diagnostic imaging of breast: Secondary | ICD-10-CM

## 2021-07-27 ENCOUNTER — Ambulatory Visit: Payer: 59

## 2021-07-27 ENCOUNTER — Ambulatory Visit
Admission: RE | Admit: 2021-07-27 | Discharge: 2021-07-27 | Disposition: A | Discharge status: Home / Self Care | Payer: 59 | Source: Ambulatory Visit | Attending: Radiology | Admitting: Radiology

## 2021-07-27 DIAGNOSIS — R922 Inconclusive mammogram: Secondary | ICD-10-CM | POA: Diagnosis not present

## 2021-07-27 DIAGNOSIS — R928 Other abnormal and inconclusive findings on diagnostic imaging of breast: Secondary | ICD-10-CM

## 2021-08-09 ENCOUNTER — Other Ambulatory Visit (HOSPITAL_COMMUNITY): Payer: Self-pay

## 2021-08-17 ENCOUNTER — Other Ambulatory Visit (HOSPITAL_COMMUNITY): Payer: Self-pay

## 2021-08-22 DIAGNOSIS — L988 Other specified disorders of the skin and subcutaneous tissue: Secondary | ICD-10-CM | POA: Diagnosis not present

## 2021-08-22 DIAGNOSIS — D485 Neoplasm of uncertain behavior of skin: Secondary | ICD-10-CM | POA: Diagnosis not present

## 2021-09-19 ENCOUNTER — Other Ambulatory Visit (HOSPITAL_COMMUNITY): Payer: Self-pay

## 2021-10-18 ENCOUNTER — Other Ambulatory Visit (HOSPITAL_COMMUNITY): Payer: Self-pay

## 2021-11-06 ENCOUNTER — Other Ambulatory Visit (HOSPITAL_COMMUNITY): Payer: Self-pay

## 2021-11-06 ENCOUNTER — Ambulatory Visit (INDEPENDENT_AMBULATORY_CARE_PROVIDER_SITE_OTHER): Payer: 59 | Admitting: Obstetrics & Gynecology

## 2021-11-06 ENCOUNTER — Other Ambulatory Visit (HOSPITAL_COMMUNITY)
Admission: RE | Admit: 2021-11-06 | Discharge: 2021-11-06 | Disposition: A | Discharge status: Home / Self Care | Payer: 59 | Source: Ambulatory Visit | Attending: Obstetrics & Gynecology | Admitting: Obstetrics & Gynecology

## 2021-11-06 ENCOUNTER — Encounter (HOSPITAL_BASED_OUTPATIENT_CLINIC_OR_DEPARTMENT_OTHER): Payer: Self-pay | Admitting: Obstetrics & Gynecology

## 2021-11-06 ENCOUNTER — Other Ambulatory Visit: Payer: Self-pay

## 2021-11-06 VITALS — BP 131/83 | HR 88 | Ht 63.5 in | Wt 147.0 lb

## 2021-11-06 DIAGNOSIS — E2839 Other primary ovarian failure: Secondary | ICD-10-CM | POA: Diagnosis not present

## 2021-11-06 DIAGNOSIS — N952 Postmenopausal atrophic vaginitis: Secondary | ICD-10-CM

## 2021-11-06 DIAGNOSIS — Z124 Encounter for screening for malignant neoplasm of cervix: Secondary | ICD-10-CM | POA: Insufficient documentation

## 2021-11-06 DIAGNOSIS — Z780 Asymptomatic menopausal state: Secondary | ICD-10-CM | POA: Diagnosis not present

## 2021-11-06 DIAGNOSIS — Z01419 Encounter for gynecological examination (general) (routine) without abnormal findings: Secondary | ICD-10-CM

## 2021-11-06 DIAGNOSIS — M6289 Other specified disorders of muscle: Secondary | ICD-10-CM | POA: Diagnosis not present

## 2021-11-06 DIAGNOSIS — N941 Unspecified dyspareunia: Secondary | ICD-10-CM | POA: Diagnosis not present

## 2021-11-06 MED ORDER — ESTRADIOL 0.1 MG/GM VA CREA
TOPICAL_CREAM | VAGINAL | 3 refills | Status: DC
  Filled 2021-11-06: qty 42.5, 90d supply, fill #0

## 2021-11-06 MED ORDER — LIDOCAINE HCL URETHRAL/MUCOSAL 2 % EX GEL
CUTANEOUS | 1 refills | Status: DC
Start: 2021-11-06 — End: 2021-11-25
  Filled 2021-11-06: qty 30, 30d supply, fill #0

## 2021-11-06 NOTE — Progress Notes (Signed)
61 y.o. G0 Married White or Caucasian female here for annual exam/new patient appt.  She has been seen in the past at [facility].  She knows a former patient, Oretha Milch. Denies vaginal bleeding.   ? ?Having pain with intercourse.  This has been a chronic problem for her.  Reports she has used some type of vaginal suppository.  She is using some topical lidocaine and this has helped.  She has looked at [employer].   ? ?Patient's last menstrual period was 09/05/2010.          ?Sexually active: Yes.    ?The current method of family planning is post menopausal status.    ?Smoker:  no ? ?Health Maintenance: ?Pap:  2019 ?History of abnormal Pap:  yes ?MMG:  06/26/2021, diagnostic follow up was normal 07/27/2021 ?Colonoscopy:  2019, follow up 5 years.  Adenomatous polyps ?BMD:   has done one in the past ?Screening Labs: done with Dr. Elease Hashimoto ? ? reports that she has never smoked. She has never used smokeless tobacco. She reports that she does not drink alcohol and does not use drugs. ? ?Past Medical History:  ?Diagnosis Date  ? Asthma   ? as a child  ? Basal cell carcinoma   ? Costochondritis, acute   ? couple years ago  ? Hyperlipidemia   ? diet controlled  ? Other dyspnea and respiratory abnormality   ? Palpitations   ? Post-operative nausea and vomiting   ? Tachycardia, unspecified   ? ? ?Past Surgical History:  ?Procedure Laterality Date  ? BASAL CELL CARCINOMA EXCISION  2004  ? right  abd  ? GANGLION CYST EXCISION  2015  ? right hand  ? REFRACTIVE SURGERY  2003  ? Bil  ? TONSILLECTOMY Bilateral 1987  ? ? ?Current Outpatient Medications  ?Medication Sig Dispense Refill  ? Calcium Carb-Cholecalciferol (CALCIUM 600 + D PO) Take by mouth daily.    ? cetirizine (ZYRTEC) 10 MG tablet Take 10 mg by mouth daily.    ? Cholecalciferol (VITAMIN D3) 2000 units TABS Take by mouth daily.    ? COVID-19 At Home Antigen Test [facility] COVID-19 HOME TEST) KIT Use as directed 4 each 0  ? metoprolol succinate  (TOPROL-XL) 50 MG 24 hr tablet Take 1 tablet (50 mg total) by mouth daily. Take with or immediately following a meal. 30 tablet 5  ? Multiple Vitamins-Calcium (ONE-A-DAY WOMENS PO) Take by mouth daily.    ? rosuvastatin (CRESTOR) 10 MG tablet Take 1 tablet (10 mg total) by mouth daily. 90 tablet 3  ? vitamin C (ASCORBIC ACID) 500 MG tablet Take 500 mg by mouth daily.    ? pseudoephedrine (SUDAFED) 30 MG tablet Take 30 mg by mouth daily. (Patient not taking: Reported on 11/06/2021)    ? ?Current Facility-Administered Medications  ?Medication Dose Route Frequency Provider Last Rate Last Admin  ? 0.9 %  sodium chloride infusion  500 mL Intravenous Once Armbruster, Carlota Raspberry, MD      ? ? ?Family History  ?Problem Relation Age of Onset  ? Heart disease Mother   ?     Atrial fibrillation  ? Colon polyps Mother   ? Cancer Father 25  ?     lung  ? Hyperlipidemia Father   ? Hypertension Father   ? Colon cancer Maternal Aunt   ? Colon cancer Paternal Aunt   ? Breast cancer Neg Hx   ? ? ?Review of Systems  ?Genitourinary:  Positive  for dyspareunia.  ?All other systems reviewed and are negative. ? ?Exam:   ?BP 131/83 (BP Location: Right Arm, Patient Position: Sitting, Cuff Size: Normal)   Pulse 88   Ht 5' 3.5" (1.613 m) Comment: replaced  Wt 147 lb (66.7 kg)   LMP 09/05/2010   BMI 25.63 kg/m?   Height: 5' 3.5" (161.3 cm) (replaced) ? ?General appearance: alert, cooperative and appears stated age ?Head: Normocephalic, without obvious abnormality, atraumatic ?Neck: no adenopathy, supple, symmetrical, trachea midline and thyroid normal to inspection and palpation ?Lungs: clear to auscultation bilaterally ?Breasts: normal appearance, no masses or tenderness ?Heart: regular rate and rhythm ?Abdomen: soft, non-tender; bowel sounds normal; no masses,  no organomegaly ?Extremities: extremities normal, atraumatic, no cyanosis or edema ?Skin: Skin color, texture, turgor normal. No rashes or lesions ?Lymph nodes: Cervical,  supraclavicular, and axillary nodes normal. ?No abnormal inguinal nodes palpated ?Neurologic: Grossly normal ? ? ?Pelvic: External genitalia:  no lesions ?             Urethra:  normal appearing urethra with no masses, tenderness or lesions ?             Bartholins and Skenes: normal    ?             Vagina: normal appearing vagina with normal color and no discharge, no lesions ?             Cervix: no lesions ?             Pap taken: Yes.   ?Bimanual Exam:  Uterus:  normal size, contour, position, consistency, mobility, non-tender ?             Adnexa: normal adnexa and no mass, fullness, tenderness ?              Rectovaginal: Confirms ?              Anus:  normal sphincter tone, no lesions ? ?Chaperone, Octaviano Batty, CMA, was present for exam. ? ?Assessment/Plan: ?1. Well woman exam with routine gynecological exam ?- pap and HR HPV obtained today ?- MMG 06/2021 ?- BMD ordered ?- colonoscopy up to date ?- vaccines reviewed/updated ?- lab work done with Dr. Elease Hashimoto ? ?2. Postmenopausal ? ?3. Hypoestrogenism ?- DG BONE DENSITY (DXA); Future ? ?4. Dyspareunia, female ? ?5. Cervical cancer screening ?- Cytology - PAP( [place]) ? ?6. Vaginal atrophy ?- estradiol (ESTRACE) 0.1 MG/GM vaginal cream; 1 gram vaginally twice weekly  Dispense: 42.5 g; Refill: 3 ? ?7. Pelvic floor dysfunction in female ?- will start pelvic PT with vaginal estradiol therapy ?- Ambulatory referral to Physical Therapy ? ? ?

## 2021-11-06 NOTE — Therapy (Signed)
?OUTPATIENT PHYSICAL THERAPY FEMALE PELVIC EVALUATION ? ? ?Patient Name: Barbara Carlson ?MRN: [id number] ?DOB:[birth year], 61 y.o., female ?Today's Date: 11/07/2021 ? ? ? ?Past Medical History:  ?Diagnosis Date  ? Asthma   ? as a child  ? Basal cell carcinoma   ? Costochondritis, acute   ? couple years ago  ? Hyperlipidemia   ? diet controlled  ? Other dyspnea and respiratory abnormality   ? Palpitations   ? Post-operative nausea and vomiting   ? Tachycardia, unspecified   ? ?Past Surgical History:  ?Procedure Laterality Date  ? BASAL CELL CARCINOMA EXCISION  2004  ? right  abd  ? CERVIX LESION DESTRUCTION    ? cryotherapy of cervix treated by Dr. Carren Rang  ? GANGLION CYST EXCISION  2015  ? right hand  ? REFRACTIVE SURGERY  2003  ? Bil  ? TONSILLECTOMY Bilateral 1987  ? ?Patient Active Problem List  ? Diagnosis Date Noted  ? Cervical radiculitis 06/29/2021  ? Chronic left-sided low back pain with left-sided sciatica 04/11/2018  ? HYPERLIPIDEMIA-MIXED 09/25/2010  ? TACHYCARDIA 11/19/2008  ? PALPITATIONS 11/19/2008  ? ? ?PCP: Eulas Post, MD ? ?REFERRING PROVIDER: Megan Salon, MD ? ?REFERRING DIAG: M62.89 (ICD-10-CM) - Pelvic floor dysfunction in female ? ?THERAPY DIAG:  ?Other muscle spasm ? ?Muscle weakness (generalized) ? ?ONSET DATE: as far back as I remember ? ?SUBJECTIVE:                                                                                                                                                                                          ? ?SUBJECTIVE STATEMENT: ?It has been worse since the back issues.  I have bulging discs in my back and neck ?Fluid intake: yes water ? ?Patient confirms identification and approves PT to assess pelvic floor and treatment Yes ? ? ?PAIN:  ?Are you having pain? Yes ?NPRS scale: 7/10 (after intercourse) ?Pain location: vaginal opening and lower abdomen ? ?Pain type: deep ?Pain description: initial pain is cramping on the sides; sore and aching and radiating up  the abdomen ? ?Aggravating factors: intercourse ?Relieving factors: uncomfortable the whole time ? ?PRECAUTIONS: None ? ?WEIGHT BEARING RESTRICTIONS No ? ?FALLS:  ?Has patient fallen in last 6 months? No ? ?LIVING ENVIRONMENT: ?Lives with: lives with their family and lives with their spouse ?Lives in: House/apartment ? ?OCCUPATION: full time nurse; clinical nurse specialist; standing a lot at a desk ? ?PLOF: Independent ? ?PATIENT GOALS be able to have intercourse without pain ? ?PERTINENT HISTORY:  ?Back and neck - severe bulging discs, chronic back pain and neck pain ?Sexual abuse: No ? ?BOWEL  MOVEMENT ?Pain with bowel movement: No ?Type of bowel movement:Type (Bristol Stool Scale) normal, Frequency regular, and Strain No ? ?URINATION ?Pain with urination: No ?Fully empty bladder: Yes:   ?Stream: Strong ?Urgency: No ?Frequency: normal ?Leakage:  a little with sneezing/coughing with full bladder ?Pads: No ? ?INTERCOURSE ?Pain with intercourse: Initial Penetration, During Penetration, and After Intercourse ?Ability to have vaginal penetration:  Yes: using lidocain ?Climax: can but not all the time ?Marinoff Scale: 2/3 ? ?PREGNANCY ?Vaginal deliveries 0 ? ? ?PROLAPSE ?None ? ? ? ?OBJECTIVE:  ? ? ?COGNITION: ? Overall cognitive status: Within functional limits for tasks assessed   ? ? ?MUSCLE LENGTH: ?Hamstrings: Right 70 deg; Left 70 deg ? ? ?LUMBAR SPECIAL TESTS:  ?ASLR +Rt leg easier with compression; grabs Lt SI with SLR no compression ? ?FUNCTIONAL TESTS:  ?Squat and SLR normal, squat very guarded and slow ? ?GAIT: ?Comments: decreased rotation ? ?POSTURE:  ?Trunk flexed ? ?LUMBARAROM/PROM ? ?A/PROM A/PROM  ?11/07/2021  ?Flexion 50%  ?Extension   ?Right lateral flexion   ?Left lateral flexion   ?Right rotation   ?Left rotation   ? ? ?LE MMT: ?  5/5 throughout ?MMT Right ?11/07/2021 Left ?11/07/2021  ?Hip flexion [facility] WFL  ?Hip extension    ?Hip abduction    ?Hip adduction    ?Hip internal rotation [facility] WFL  ?Hip  external rotation 75% 75%  ?Knee flexion    ?Knee extension    ?Ankle dorsiflexion    ?Ankle plantarflexion    ?Ankle inversion    ?Ankle eversion    ? ?PELVIC MMT: ?  ?MMT  ?11/07/2021  ?Vaginal 3/5 x 5 quick reps; 4 sec hold  ?Internal Anal Sphincter   ?External Anal Sphincter   ?Puborectalis   ?Diastasis Recti   ?(Blank rows = not tested) ? ?      PALPATION: ?  General  lumbar and upper traps; tension in spinal erectors throughout ? ?              External Perineal Exam perineal body elevated ?              ?              Internal Pelvic Floor Lt side tight; release with some static holds, release with some deep breathing; holding breath with pelvic floor contraction ? ?TONE: ?high ? ?PROLAPSE: ?no ? ?TODAY'S TREATMENT  ?  ? ? ?PATIENT EDUCATION:  ?Education details: Access Code: [email or web address] and stretch to vaginal canal sample of relevium provided ?Person educated: Patient ?Education method: Explanation, Demonstration, Verbal cues, and Handouts ?Education comprehension: verbalized understanding and returned demonstration ? ? ?HOME EXERCISE PROGRAM: ?Access Code: [email or web address] ?URL: [email or web address] ?Date: 11/07/2021 ?Prepared by: Jari Favre ? ?Exercises ?- Seated Diaphragmatic Breathing  - 3 x daily - 7 x weekly - 1 sets - 10 reps ?- Seated Hamstring Stretch  - 1 x daily - 7 x weekly - 1 sets - 3 reps - 30 sec hold ? ?ASSESSMENT: ? ?CLINICAL IMPRESSION: ?Patient is a 61 y.o. female who was seen today for physical therapy evaluation and treatment for dyspareunia. Pt has had this issue chronically for many years.  It is compounded with her back and neck pain due to muscle spasms from pain.  Pt has some bulging and tenting in abdomen with MMT abdominal muscles.  Posture as noted above.  Pt has 5/5 hip strength, 3/5 pelvic floor with high tone and endurance limited.  Pt will benefit from skilled PT  to address all above mentioned impairments for improved activities of daily living and interpersonal  relationships ? ? ?OBJECTIVE IMPAIRMENTS decreased coordination, decreased endurance, decreased ROM, decreased strength, increased fascial restrictions, increased muscle spasms, impaired flexibility, impaired tone, postural dysfunction, and pain.  ? ?ACTIVITY LIMITATIONS community activity, laundry, yard work, and personal relationship .  ? ?PERSONAL FACTORS Time since onset of injury/illness/exacerbation and 1-2 comorbidities: bulging discs throughout lumbar and cervical regions  are also affecting patient's functional outcome.  ? ? ?REHAB POTENTIAL: Good ? ?CLINICAL DECISION MAKING: Evolving/moderate complexity ? ?EVALUATION COMPLEXITY: Moderate ? ? ?GOALS: ?Goals reviewed with patient? Yes ? ?SHORT TERM GOALS: Target date: 12/05/2021 ? ?Ind with initial HEP ?Baseline:  ?Goal status: INITIAL ? ?2.  Report 20% less pain ?Baseline:  ?Goal status: INITIAL ? ? ? ?LONG TERM GOALS: Target date: 01/30/2022 ? ?Pt will be independent with advanced HEP to maintain improvements made throughout therapy ? ?Baseline:  ?Goal status: INITIAL ? ?2.  Pt will have 1/3 or less score of Marinoff scale ? ?Baseline: 2/3 ?Goal status: INITIAL ? ?3.  Pt will report 50% reduction of pain throughout spine due to improvements in posture, strength, and muscle length  ?Baseline:  ?Goal status: INITIAL ? ?4.  Pt will be able to lift at least 5 lb correctly for 10 reps without pain for improved functional activities such as yard work ?Baseline:  ?Goal status: INITIAL ? ? ? ? ?PLAN: ?PT FREQUENCY: 1x/week ? ?PT DURATION: 12 weeks ? ?PLANNED INTERVENTIONS: Therapeutic exercises, Therapeutic activity, Neuromuscular re-education, Balance training, Gait training, Patient/Family education, Joint mobilization, Dry Needling, Electrical stimulation, Cryotherapy, Moist heat, Taping, Biofeedback, and Manual therapy ? ?PLAN FOR NEXT SESSION: gentle lumbar and thoracic mobs get things moving to tolerance, sit on foam noodle, pball rolling, internal STM and/or  discuss dilator/wand if time allows ? ? ?Jule Ser, PT ?11/07/2021, 11:18 AM ? ?

## 2021-11-07 ENCOUNTER — Ambulatory Visit: Payer: 59 | Attending: Obstetrics & Gynecology | Admitting: Physical Therapy

## 2021-11-07 ENCOUNTER — Encounter: Payer: Self-pay | Admitting: Physical Therapy

## 2021-11-07 DIAGNOSIS — M62838 Other muscle spasm: Secondary | ICD-10-CM | POA: Diagnosis not present

## 2021-11-07 DIAGNOSIS — M6281 Muscle weakness (generalized): Secondary | ICD-10-CM | POA: Insufficient documentation

## 2021-11-07 DIAGNOSIS — M6289 Other specified disorders of muscle: Secondary | ICD-10-CM | POA: Insufficient documentation

## 2021-11-09 LAB — CYTOLOGY - PAP
Comment: NEGATIVE
Diagnosis: NEGATIVE
Diagnosis: REACTIVE
High risk HPV: NEGATIVE

## 2021-11-10 ENCOUNTER — Other Ambulatory Visit (HOSPITAL_COMMUNITY): Payer: Self-pay

## 2021-11-16 ENCOUNTER — Other Ambulatory Visit (HOSPITAL_COMMUNITY): Payer: Self-pay

## 2021-11-22 NOTE — Therapy (Signed)
?OUTPATIENT PHYSICAL THERAPY TREATMENT NOTE ? ? ?Patient Name: Barbara Carlson ?MRN: [id number] ?DOB:[birth year], 61 y.o., female ?Today's Date: 11/24/2021 ? ?PCP: Eulas Post, MD ?REFERRING PROVIDER: Megan Salon, MD ? ?END OF SESSION:  ? PT End of Session - 11/23/21 0850   ? ? Visit Number 2   ? Date for PT Re-Evaluation 01/30/22   ? PT Start Time [phone]   ? PT Stop Time 0930   ? PT Time Calculation (min) 40 min   ? Activity Tolerance Patient tolerated treatment well   ? Behavior During Therapy [facility] for tasks assessed/performed   ? ?  ?  ? ?  ? ? ?Past Medical History:  ?Diagnosis Date  ? Asthma   ? as a child  ? Basal cell carcinoma   ? Costochondritis, acute   ? couple years ago  ? Hyperlipidemia   ? diet controlled  ? Other dyspnea and respiratory abnormality   ? Palpitations   ? Post-operative nausea and vomiting   ? Tachycardia, unspecified   ? ?Past Surgical History:  ?Procedure Laterality Date  ? BASAL CELL CARCINOMA EXCISION  2004  ? right  abd  ? CERVIX LESION DESTRUCTION    ? cryotherapy of cervix treated by Dr. Carren Rang  ? GANGLION CYST EXCISION  2015  ? right hand  ? REFRACTIVE SURGERY  2003  ? Bil  ? TONSILLECTOMY Bilateral 1987  ? ?Patient Active Problem List  ? Diagnosis Date Noted  ? Cervical radiculitis 06/29/2021  ? Chronic left-sided low back pain with left-sided sciatica 04/11/2018  ? HYPERLIPIDEMIA-MIXED 09/25/2010  ? TACHYCARDIA 11/19/2008  ? PALPITATIONS 11/19/2008  ? ? ?REFERRING DIAG: M62.89 (ICD-10-CM) - Pelvic floor dysfunction in female ? ?THERAPY DIAG:  ?Other muscle spasm ? ?Muscle weakness (generalized) ? ?Chronic left-sided low back pain with left-sided sciatica ? ?PERTINENT HISTORY: Back and neck - severe bulging discs, chronic back pain and neck pain ? ?PRECAUTIONS: None ? ?SUBJECTIVE: It is still pinching/uncomfortable.  The relevium sample worked better than the lidocaine I was using ? ?PAIN:  ?Are you having pain? No ? ? ? ?OBJECTIVE:  ?  ?  ?COGNITION: ?           Overall  cognitive status: Within functional limits for tasks assessed              ?  ?  ?MUSCLE LENGTH: ?Hamstrings: Right 70 deg; Left 70 deg ?  ?  ?LUMBAR SPECIAL TESTS:  ?ASLR +Rt leg easier with compression; grabs Lt SI with SLR no compression ?  ?FUNCTIONAL TESTS:  ?Squat and SLR normal, squat very guarded and slow ?  ?GAIT: ?Comments: decreased rotation ?  ?POSTURE:  ?Trunk flexed ?  ?LUMBARAROM/PROM ?  ?A/PROM A/PROM  ?11/07/2021  ?Flexion 50%  ? ?  ?LE MMT: ?                        5/5 throughout ?MMT Right ?11/07/2021 Left ?11/07/2021  ?Hip flexion [facility] WFL  ?Hip extension      ?Hip abduction      ?Hip adduction      ?Hip internal rotation [facility] WFL  ?Hip external rotation 75% 75%  ?Knee flexion      ? ?PELVIC MMT: ?  ?MMT   ?11/07/2021  ?Vaginal 3/5 x 5 quick reps; 4 sec hold  ?   ? ?      PALPATION: ?  General  lumbar and upper traps; tension in spinal erectors  throughout ?  ?              External Perineal Exam perineal body elevated ?              ?              Internal Pelvic Floor Lt side tight; release with some static holds, release with some deep breathing; holding breath with pelvic floor contraction ?  ?TONE: ?high ?  ?PROLAPSE: ?no ?  ?TODAY'S TREATMENT  ?Treatment: 11/23/21 ?Exercises  ?Knees to chest ?Hip rotation stretch ?Happy baby ?Butterfly stretch ?Diaphragmatic breathing with all stretches ?Manual ?Lumbar STM ?Trigger Point Dry-Needling  ?Treatment instructions: Expect mild to moderate muscle soreness. S/S of pneumothorax if dry needled over a lung field, and to seek immediate medical attention should they occur. Patient verbalized understanding of these instructions and education. ? ?Patient Consent Given: Yes ?Education handout provided: Yes ?Muscles treated: lumbar multifidi ?Electrical stimulation performed: No ?Parameters: N/A ?Treatment response/outcome: increased muscle length ?Patient confirms identification and approves physical therapist to perform internal soft tissue work  ?Internal STM  transverse peroneus and bil levators ?Nuero Re-ed ?Education and cues for coordination of breathing and pelvic floor muscle contracting and relaxing at appropriate times ? ?Therapeutic activities ?  ?  ?  ?PATIENT EDUCATION:  ?Education details: Access Code: [email or web address]  ?Person educated: Patient ?Education method: Explanation, Demonstration, Verbal cues, and Handouts ?Education comprehension: verbalized understanding and returned demonstration ?  ?  ?HOME EXERCISE PROGRAM: ?Access Code: [email or web address] ?URL: [email or web address] ?Date: 11/24/2021 ?Prepared by: Jari Favre ? ?Exercises ?- Seated Diaphragmatic Breathing  - 3 x daily - 7 x weekly - 1 sets - 10 reps ?- Seated Hamstring Stretch  - 1 x daily - 7 x weekly - 1 sets - 3 reps - 30 sec hold ?- Supine Single Knee to Chest Stretch  - 2 x daily - 7 x weekly - 1 sets - 5 reps - 10 sec hold ?- Happy Baby with Pelvic Floor Lengthening  - 1 x daily - 7 x weekly - 1 sets - 3 reps - 30 hold ?- Supine Bilateral Hip Internal Rotation Stretch  - 1 x daily - 7 x weekly - 1 sets - 3 reps - 30 sec hold ?- Supine Butterfly Groin Stretch  - 1 x daily - 7 x weekly - 1 sets - 3 reps - 30 sec hold ?  ?ASSESSMENT: ?  ?CLINICAL IMPRESSION: ? Pt presents to clinic at initial f/u and was given stretches for improved lumbar and hip mobility.  Pt has a lot of tension and guarding in spine and will benefit from skilled PT to address lumbopelvic mobility and abdominal activation for improved muscle balance throughout. ?  ?OBJECTIVE IMPAIRMENTS decreased coordination, decreased endurance, decreased ROM, decreased strength, increased fascial restrictions, increased muscle spasms, impaired flexibility, impaired tone, postural dysfunction, and pain.  ?  ?ACTIVITY LIMITATIONS community activity, laundry, yard work, and personal relationship .  ?  ?PERSONAL FACTORS Time since onset of injury/illness/exacerbation and 1-2 comorbidities: bulging discs throughout lumbar and cervical  regions  are also affecting patient's functional outcome.  ?  ?  ?REHAB POTENTIAL: Good ?  ?CLINICAL DECISION MAKING: Evolving/moderate complexity ?  ?EVALUATION COMPLEXITY: Moderate ?  ?  ?GOALS: ?Goals reviewed with patient? Yes ?  ?SHORT TERM GOALS: Target date: 12/05/2021 ?  ?Ind with initial HEP ?Baseline:  ?Goal status: met ?  ?2.  Report 20% less pain ?Baseline:  ?Goal status: ongoing ?  ?  ?  ?  LONG TERM GOALS: Target date: 01/30/2022 ?  ?Pt will be independent with advanced HEP to maintain improvements made throughout therapy ?  ?Baseline:  ?Goal status: INITIAL ?  ?2.  Pt will have 1/3 or less score of Marinoff scale ?  ?Baseline: 2/3 ?Goal status: INITIAL ?  ?3.  Pt will report 50% reduction of pain throughout spine due to improvements in posture, strength, and muscle length  ?Baseline:  ?Goal status: INITIAL ?  ?4.  Pt will be able to lift at least 5 lb correctly for 10 reps without pain for improved functional activities such as yard work ?Baseline:  ?Goal status: INITIAL ?  ?  ?  ?  ?PLAN: ?PT FREQUENCY: 1x/week ?  ?PT DURATION: 12 weeks ?  ?PLANNED INTERVENTIONS: Therapeutic exercises, Therapeutic activity, Neuromuscular re-education, Balance training, Gait training, Patient/Family education, Joint mobilization, Dry Needling, Electrical stimulation, Cryotherapy, Moist heat, Taping, Biofeedback, and Manual therapy ?  ?PLAN FOR NEXT SESSION:  f/u on dry needle to lumbar; qped if able or lean on table; core activation; internal STM and/or discuss dilator/wand if time allows ? ?Jule Ser, PT ?11/24/2021, 9:59 AM ? ?  ? ?

## 2021-11-23 ENCOUNTER — Encounter: Payer: Self-pay | Admitting: Physical Therapy

## 2021-11-23 ENCOUNTER — Ambulatory Visit: Payer: 59 | Attending: Obstetrics & Gynecology | Admitting: Physical Therapy

## 2021-11-23 DIAGNOSIS — M62838 Other muscle spasm: Secondary | ICD-10-CM | POA: Insufficient documentation

## 2021-11-23 DIAGNOSIS — M6281 Muscle weakness (generalized): Secondary | ICD-10-CM | POA: Insufficient documentation

## 2021-11-23 DIAGNOSIS — M5442 Lumbago with sciatica, left side: Secondary | ICD-10-CM | POA: Insufficient documentation

## 2021-11-23 DIAGNOSIS — G8929 Other chronic pain: Secondary | ICD-10-CM | POA: Insufficient documentation

## 2021-11-25 ENCOUNTER — Emergency Department (INDEPENDENT_AMBULATORY_CARE_PROVIDER_SITE_OTHER)
Admission: EM | Admit: 2021-11-25 | Discharge: 2021-11-25 | Disposition: A | Discharge status: Home / Self Care | Payer: 59 | Source: Home / Self Care | Attending: Family Medicine | Admitting: Family Medicine

## 2021-11-25 ENCOUNTER — Emergency Department: Admit: 2021-11-25 | Payer: Self-pay

## 2021-11-25 DIAGNOSIS — J020 Streptococcal pharyngitis: Secondary | ICD-10-CM

## 2021-11-25 LAB — POCT RAPID STREP A (OFFICE): Rapid Strep A Screen: POSITIVE — AB

## 2021-11-25 MED ORDER — HYDROCODONE-ACETAMINOPHEN 5-325 MG PO TABS: 1.0000 | ORAL_TABLET | Freq: Four times a day (QID) | ORAL | 0 refills | Status: DC | PRN

## 2021-11-25 MED ORDER — PENICILLIN V POTASSIUM 500 MG PO TABS: 500.0000 mg | ORAL_TABLET | Freq: Two times a day (BID) | ORAL | 0 refills | Status: AC

## 2021-11-25 NOTE — ED Triage Notes (Addendum)
Pt presents to Urgent Care with c/o severe sore throat x 3 days, also nasal congestion. Pt is an Therapist, sports and concerned about strep throat. Negative home COVID test this AM.  ?

## 2021-11-25 NOTE — Discharge Instructions (Signed)
Take the penicillin 2 times a day for 10 full days ?Take 2 doses today ?Take the hydrocodone pain medicine as needed.  Do not drive on hydrocodone ?May swish and spit the lidocaine as needed ?Drink lots of fluids ?Call for problems ?

## 2021-11-25 NOTE — ED Provider Notes (Signed)
?[place] ? ? ? ?CSN: [id number] ?Arrival date & time: 11/25/21  1254 ? ? ?  ? ?History   ?Chief Complaint ?Chief Complaint  ?Patient presents with  ? Sore Throat  ? Nasal Congestion  ? ? ?HPI ?Barbara Carlson is a 61 y.o. female.  ? ?HPI ? 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  Is here with a severe sore throat.  Its been worsening over the last 2 days.  No fever or chills.  No headache or body ache. ? ?Past Medical History:  ?Diagnosis Date  ? Asthma   ? as a child  ? Basal cell carcinoma   ? Costochondritis, acute   ? couple years ago  ? Hyperlipidemia   ? diet controlled  ? Other dyspnea and respiratory abnormality   ? Palpitations   ? Post-operative nausea and vomiting   ? Tachycardia, unspecified   ? ? ?Patient Active Problem List  ? Diagnosis Date Noted  ? Cervical radiculitis 06/29/2021  ? Chronic left-sided low back pain with left-sided sciatica 04/11/2018  ? HYPERLIPIDEMIA-MIXED 09/25/2010  ? TACHYCARDIA 11/19/2008  ? PALPITATIONS 11/19/2008  ? ? ?Past Surgical History:  ?Procedure Laterality Date  ? BASAL CELL CARCINOMA EXCISION  2004  ? right  abd  ? CERVIX LESION DESTRUCTION    ? cryotherapy of cervix treated by Dr. Carren Rang  ? GANGLION CYST EXCISION  2015  ? right hand  ? REFRACTIVE SURGERY  2003  ? Bil  ? TONSILLECTOMY Bilateral 1987  ? ? ?OB History   ? ? Gravida  ?0  ? Para  ?0  ? Term  ?0  ? Preterm  ?0  ? AB  ?0  ? Living  ?0  ?  ? ? SAB  ?0  ? IAB  ?0  ? Ectopic  ?0  ? Multiple  ?0  ? Live Births  ?0  ?   ?  ?  ? ? ? ?Home Medications   ? ?Prior to Admission medications   ?Medication Sig Start Date End Date Taking? Authorizing Provider  ?HYDROcodone-acetaminophen (NORCO/VICODIN) 5-325 MG tablet Take 1-2 tablets by mouth every 6 (six) hours as needed. 11/25/21  Yes Raylene Everts, MD  ?penicillin v potassium (VEETID) 500 MG tablet Take 1 tablet (500 mg total) by mouth 2 (two) times daily for 10 days. 11/25/21 12/05/21 Yes Raylene Everts, MD  ?Calcium Carb-Cholecalciferol  (CALCIUM 600 + D PO) Take by mouth daily.    [provider]  ?cetirizine (ZYRTEC) 10 MG tablet Take 10 mg by mouth daily.    [provider]  ?Cholecalciferol (VITAMIN D3) 2000 units TABS Take by mouth daily.    [provider]  ?estradiol (ESTRACE) 0.1 MG/GM vaginal cream Place 1 gram vaginally twice weekly 11/06/21   Megan Salon, MD  ?metoprolol succinate (TOPROL-XL) 50 MG 24 hr tablet Take 1 tablet (50 mg total) by mouth daily. Take with or immediately following a meal. 06/20/21   Burchette, Alinda Sierras, MD  ?Multiple Vitamins-Calcium (ONE-A-DAY WOMENS PO) Take by mouth daily.    [provider]  ?pseudoephedrine (SUDAFED) 30 MG tablet Take 30 mg by mouth as needed.    [provider]  ?rosuvastatin (CRESTOR) 10 MG tablet Take 1 tablet (10 mg total) by mouth daily. 02/07/21   Burchette, Alinda Sierras, MD  ?vitamin C (ASCORBIC ACID) 500 MG tablet Take 500 mg by mouth daily.    [provider]  ? ? ?Family History ?Family  History  ?Problem Relation Age of Onset  ? Heart disease Mother   ?     Atrial fibrillation  ? Colon polyps Mother   ? Cancer Father 58  ?     lung  ? Hyperlipidemia Father   ? Hypertension Father   ? Colon cancer Maternal Aunt   ? Colon cancer Paternal Aunt   ? Breast cancer Neg Hx   ? ? ?Social History ?Social History  ? ?Tobacco Use  ? Smoking status: Never  ? Smokeless tobacco: Never  ?Vaping Use  ? Vaping Use: Never used  ?Substance Use Topics  ? Alcohol use: No  ? Drug use: No  ? ? ? ?Allergies   ?Patient has no known allergies. ? ? ?Review of Systems ?Review of Systems ? ?See HPI ?Physical Exam ?Triage Vital Signs ?ED Triage Vitals  ?Enc Vitals Group  ?   BP 11/25/21 1423 125/80  ?   Pulse Rate 11/25/21 1423 (!) 105  ?   Resp 11/25/21 1423 20  ?   Temp 11/25/21 1423 98.5 ?F (36.9 ?C)  ?   Temp Source 11/25/21 1423 Oral  ?   SpO2 11/25/21 1423 99 %  ?   Weight 11/25/21 1418 147 lb (66.7 kg)  ?   Height 11/25/21 1418 5' 3.5" (1.613 m)  ?   Head  Circumference --   ?   Peak Flow --   ?   Pain Score 11/25/21 1418 7  ?   Pain Loc --   ?   Pain Edu? --   ?   Excl. in [place]? --   ? ?No data found. ? ?Updated Vital Signs ?BP 125/80 (BP Location: Right Arm)   Pulse (!) 105   Temp 98.5 ?F (36.9 ?C) (Oral)   Resp 20   Ht 5' 3.5" (1.613 m)   Wt 66.7 kg   LMP 09/05/2010   SpO2 99%   BMI 25.63 kg/m?  ?   ? ?Physical Exam ?Constitutional:   ?   General: She is not in acute distress. ?   Appearance: She is well-developed and normal weight.  ?HENT:  ?   Head: Normocephalic and atraumatic.  ?   Right Ear: Tympanic membrane and ear canal normal.  ?   Left Ear: Tympanic membrane and ear canal normal.  ?   Mouth/Throat:  ?   Mouth: Mucous membranes are moist.  ?   Pharynx: Uvula midline. Posterior oropharyngeal erythema and uvula swelling present.  ?   Tonsils: Tonsillar exudate present. 0 on the right. 0 on the left.  ?Eyes:  ?   Conjunctiva/sclera: Conjunctivae normal.  ?   Pupils: Pupils are equal, round, and reactive to light.  ?Cardiovascular:  ?   Rate and Rhythm: Normal rate.  ?Pulmonary:  ?   Effort: Pulmonary effort is normal. No respiratory distress.  ?Abdominal:  ?   General: There is no distension.  ?   Palpations: Abdomen is soft.  ?Musculoskeletal:     ?   General: Normal range of motion.  ?   Cervical back: Normal range of motion.  ?Lymphadenopathy:  ?   Cervical: Cervical adenopathy present.  ?Skin: ?   General: Skin is warm and dry.  ?Neurological:  ?   Mental Status: She is alert.  ? ? ? ?UC Treatments / Results  ?Labs ?(all labs ordered are listed, but only abnormal results are displayed) ?Labs Reviewed  ?POCT RAPID STREP A (OFFICE) - Abnormal; Notable for the following components:  ?  Result Value  ? Rapid Strep A Screen Positive (*)   ? All other components within normal limits  ? ? ?EKG ? ? ?Radiology ?No results found. ? ?Procedures ?Procedures (including critical care time) ? ?Medications Ordered in UC ?Medications - No data to  display ? ?Initial Impression / Assessment and Plan / UC Course  ?I have reviewed the triage vital signs and the nursing notes. ? ?Pertinent labs & imaging results that were available during my care of the patient were reviewed by me and considered in my medical decision making (see chart for details). ? ?  ?Final Clinical Impressions(s) / UC Diagnoses  ? ?Final diagnoses:  ?Strep pharyngitis  ? ? ? ?Discharge Instructions   ? ?  ?Take the penicillin 2 times a day for 10 full days ?Take 2 doses today ?Take the hydrocodone pain medicine as needed.  Do not drive on hydrocodone ?May swish and spit the lidocaine as needed ?Drink lots of fluids ?Call for problems ? ? ?ED Prescriptions   ? ? Medication Sig Dispense Auth. Provider  ? penicillin v potassium (VEETID) 500 MG tablet Take 1 tablet (500 mg total) by mouth 2 (two) times daily for 10 days. 20 tablet Raylene Everts, MD  ? HYDROcodone-acetaminophen (NORCO/VICODIN) 5-325 MG tablet Take 1-2 tablets by mouth every 6 (six) hours as needed. 10 tablet Raylene Everts, MD  ? ?  ? ?I have reviewed the PDMP during this encounter. ?  ?Raylene Everts, MD ?11/25/21 1519 ? ?

## 2021-12-01 DIAGNOSIS — D225 Melanocytic nevi of trunk: Secondary | ICD-10-CM | POA: Diagnosis not present

## 2021-12-01 DIAGNOSIS — L821 Other seborrheic keratosis: Secondary | ICD-10-CM | POA: Diagnosis not present

## 2021-12-01 DIAGNOSIS — D2271 Melanocytic nevi of right lower limb, including hip: Secondary | ICD-10-CM | POA: Diagnosis not present

## 2021-12-01 DIAGNOSIS — L578 Other skin changes due to chronic exposure to nonionizing radiation: Secondary | ICD-10-CM | POA: Diagnosis not present

## 2021-12-01 DIAGNOSIS — D2261 Melanocytic nevi of right upper limb, including shoulder: Secondary | ICD-10-CM | POA: Diagnosis not present

## 2021-12-01 DIAGNOSIS — Z85828 Personal history of other malignant neoplasm of skin: Secondary | ICD-10-CM | POA: Diagnosis not present

## 2021-12-01 DIAGNOSIS — Z86018 Personal history of other benign neoplasm: Secondary | ICD-10-CM | POA: Diagnosis not present

## 2021-12-01 DIAGNOSIS — L570 Actinic keratosis: Secondary | ICD-10-CM | POA: Diagnosis not present

## 2021-12-05 ENCOUNTER — Ambulatory Visit: Payer: 59 | Admitting: Physical Therapy

## 2021-12-05 DIAGNOSIS — M62838 Other muscle spasm: Secondary | ICD-10-CM | POA: Diagnosis not present

## 2021-12-05 DIAGNOSIS — G8929 Other chronic pain: Secondary | ICD-10-CM | POA: Diagnosis not present

## 2021-12-05 DIAGNOSIS — M5442 Lumbago with sciatica, left side: Secondary | ICD-10-CM | POA: Diagnosis not present

## 2021-12-05 DIAGNOSIS — M6281 Muscle weakness (generalized): Secondary | ICD-10-CM

## 2021-12-05 NOTE — Therapy (Signed)
?OUTPATIENT PHYSICAL THERAPY TREATMENT NOTE ? ? ?Patient Name: Barbara Carlson ?MRN: [id number] ?DOB:July 30, 1961, 61 y.o., female ?Today's Date: 12/05/2021 ? ?PCP: Eulas Post, MD ?REFERRING PROVIDER: Megan Salon, MD ? ?END OF SESSION:  ? PT End of Session - 12/05/21 1001   ? ? Visit Number 3   ? Date for PT Re-Evaluation 01/30/22   ? PT Start Time [phone]   ? PT Stop Time 1010   ? PT Time Calculation (min) 39 min   ? Activity Tolerance Patient tolerated treatment well   ? Behavior During Therapy [facility] for tasks assessed/performed   ? ?  ?  ? ?  ? ? ? ?Past Medical History:  ?Diagnosis Date  ? Asthma   ? as a child  ? Basal cell carcinoma   ? Costochondritis, acute   ? couple years ago  ? Hyperlipidemia   ? diet controlled  ? Other dyspnea and respiratory abnormality   ? Palpitations   ? Post-operative nausea and vomiting   ? Tachycardia, unspecified   ? ?Past Surgical History:  ?Procedure Laterality Date  ? BASAL CELL CARCINOMA EXCISION  2004  ? right  abd  ? CERVIX LESION DESTRUCTION    ? cryotherapy of cervix treated by Dr. Carren Rang  ? GANGLION CYST EXCISION  2015  ? right hand  ? REFRACTIVE SURGERY  2003  ? Bil  ? TONSILLECTOMY Bilateral 1987  ? ?Patient Active Problem List  ? Diagnosis Date Noted  ? Cervical radiculitis 06/29/2021  ? Chronic left-sided low back pain with left-sided sciatica 04/11/2018  ? HYPERLIPIDEMIA-MIXED 09/25/2010  ? TACHYCARDIA 11/19/2008  ? PALPITATIONS 11/19/2008  ? ? ?REFERRING DIAG: M62.89 (ICD-10-CM) - Pelvic floor dysfunction in female ? ?THERAPY DIAG:  ?Other muscle spasm ? ?Muscle weakness (generalized) ? ?Chronic left-sided low back pain with left-sided sciatica ? ?PERTINENT HISTORY: Back and neck - severe bulging discs, chronic back pain and neck pain ? ?PRECAUTIONS: None ? ?SUBJECTIVE: It is still pinching/uncomfortable and I bled a little more than usual last time ? ?PAIN:  ?Are you having pain? No (same as usual) ? ? ? ?OBJECTIVE:  ?  ?  ?COGNITION: ?           Overall  cognitive status: Within functional limits for tasks assessed              ?  ?  ?MUSCLE LENGTH: ?Hamstrings: Right 70 deg; Left 70 deg ?  ?  ?LUMBAR SPECIAL TESTS:  ?ASLR +Rt leg easier with compression; grabs Lt SI with SLR no compression ?  ?FUNCTIONAL TESTS:  ?Squat and SLR normal, squat very guarded and slow ?  ?GAIT: ?Comments: decreased rotation ?  ?POSTURE:  ?Trunk flexed ?  ?LUMBARAROM/PROM ?  ?A/PROM A/PROM  ?11/07/2021  ?Flexion 50%  ? ?  ?LE MMT: ?                        5/5 throughout ?MMT Right ?11/07/2021 Left ?11/07/2021  ?Hip flexion [facility] WFL  ?Hip extension      ?Hip abduction      ?Hip adduction      ?Hip internal rotation [facility] WFL  ?Hip external rotation 75% 75%  ?Knee flexion      ? ?PELVIC MMT: ?  ?MMT   ?11/07/2021  ?Vaginal 3/5 x 5 quick reps; 4 sec hold  ?   ? ?      PALPATION: ?  General  lumbar and upper traps; tension in  spinal erectors throughout ?  ?              External Perineal Exam perineal body elevated ?              ?              Internal Pelvic Floor Lt side tight; release with some static holds, release with some deep breathing; holding breath with pelvic floor contraction ?  ?TONE: ?high ?  ?PROLAPSE: ?no ?  ?TODAY'S TREATMENT  ?Treatment: 12/05/21 ?Exercises  ?Qped UE reaches ?Child pose ?Rocking in qped ? ?SELF CARE: ?Dilator usage and finding the correct one for patient ? ?Manual ?Internal STM - levators bil ?Exteral to transverse peroneus, ischiocavernosis, bulbocavernosis with fascial release to vaginal vault ? ?Treatment: 11/23/21 ?Exercises  ?Knees to chest ?Hip rotation stretch ?Happy baby ?Butterfly stretch ?Diaphragmatic breathing with all stretches ?Manual ?Lumbar STM ?Trigger Point Dry-Needling  ?Treatment instructions: Expect mild to moderate muscle soreness. S/S of pneumothorax if dry needled over a lung field, and to seek immediate medical attention should they occur. Patient verbalized understanding of these instructions and education. ? ?Patient Consent Given:  Yes ?Education handout provided: Yes ?Muscles treated: lumbar multifidi ?Electrical stimulation performed: No ?Parameters: N/A ?Treatment response/outcome: increased muscle length ?Patient confirms identification and approves physical therapist to perform internal soft tissue work  ?Internal STM transverse peroneus and bil levators ?Nuero Re-ed ?Education and cues for coordination of breathing and pelvic floor muscle contracting and relaxing at appropriate times ? ?  ?  ?PATIENT EDUCATION:  ?Education details: Access Code: [email or web address]  ?Person educated: Patient ?Education method: Explanation, Demonstration, Verbal cues, and Handouts ?Education comprehension: verbalized understanding and returned demonstration ?  ?  ?HOME EXERCISE PROGRAM: ?Access Code: [email or web address] ?URL: [email or web address] ?Date: 11/24/2021 ?Prepared by: Jari Favre ? ?Exercises ?- Seated Diaphragmatic Breathing  - 3 x daily - 7 x weekly - 1 sets - 10 reps ?- Seated Hamstring Stretch  - 1 x daily - 7 x weekly - 1 sets - 3 reps - 30 sec hold ?- Supine Single Knee to Chest Stretch  - 2 x daily - 7 x weekly - 1 sets - 5 reps - 10 sec hold ?- Happy Baby with Pelvic Floor Lengthening  - 1 x daily - 7 x weekly - 1 sets - 3 reps - 30 hold ?- Supine Bilateral Hip Internal Rotation Stretch  - 1 x daily - 7 x weekly - 1 sets - 3 reps - 30 sec hold ?- Supine Butterfly Groin Stretch  - 1 x daily - 7 x weekly - 1 sets - 3 reps - 30 sec hold ?  ?ASSESSMENT: ?  ?CLINICAL IMPRESSION: ? Pt was educated on dilators and how to order the correct sizes as well as demonstrating different samples  to find what might work the best for her.  Today's treatment included internal STM and pt Patient confirms identification and approves physical therapist to perform internal soft tissue work. Pt has tension on Rt>Lt.  Responded well to [facility] but is going to get dilators to help with sustaining the increased soft tissue length achieved during treatment.  Pt was  given core exercises today to continue to progress with down training of pelvic floor and lumbar.  Continue to work on [place] to achieve functional goals ?  ?OBJECTIVE IMPAIRMENTS decreased coordination, decreased endurance, decreased ROM, decreased strength, increased fascial restrictions, increased muscle spasms, impaired flexibility, impaired tone, postural dysfunction, and pain.  ?  ?ACTIVITY LIMITATIONS community  activity, laundry, yard work, and personal relationship .  ?  ?PERSONAL FACTORS Time since onset of injury/illness/exacerbation and 1-2 comorbidities: bulging discs throughout lumbar and cervical regions  are also affecting patient's functional outcome.  ?  ?  ?REHAB POTENTIAL: Good ?  ?CLINICAL DECISION MAKING: Evolving/moderate complexity ?  ?EVALUATION COMPLEXITY: Moderate ?  ?  ?GOALS: ?Goals reviewed with patient? Yes ?  ?SHORT TERM GOALS: Target date: 12/05/2021 ?  ?Ind with initial HEP ?Baseline:  ?Goal status: met ?  ?2.  Report 20% less pain ?Baseline: same updated 12/05/21 ? ?Goal status: ongoing ?  ?  ?  ?LONG TERM GOALS: Target date: 01/30/2022 ?  ?Pt will be independent with advanced HEP to maintain improvements made throughout therapy ?  ?Baseline:  ?Goal status: INITIAL ?  ?2.  Pt will have 1/3 or less score of Marinoff scale ?  ?Baseline: 2/3 updated 12/05/21 ? ?Goal status: INITIAL ?  ?3.  Pt will report 50% reduction of pain throughout spine due to improvements in posture, strength, and muscle length  ?Baseline: same updated 12/05/21 ? ?Goal status: INITIAL ?  ?4.  Pt will be able to lift at least 5 lb correctly for 10 reps without pain for improved functional activities such as yard work ?Baseline:  ?Goal status: INITIAL ?  ?  ?  ?  ?PLAN: ?PT FREQUENCY: 1x/week ?  ?PT DURATION: 12 weeks ?  ?PLANNED INTERVENTIONS: Therapeutic exercises, Therapeutic activity, Neuromuscular re-education, Balance training, Gait training, Patient/Family education, Joint mobilization, Dry Needling, Electrical  stimulation, Cryotherapy, Moist heat, Taping, Biofeedback, and Manual therapy ?  ?PLAN FOR NEXT SESSION:  f/u on quadruped and dilators; internal STM, sacral mobs, progress core and progress lumbar mobility ? ? ?J

## 2021-12-13 ENCOUNTER — Encounter: Payer: Self-pay | Admitting: Physical Therapy

## 2021-12-13 ENCOUNTER — Ambulatory Visit: Payer: 59 | Attending: Obstetrics & Gynecology | Admitting: Physical Therapy

## 2021-12-13 DIAGNOSIS — M62838 Other muscle spasm: Secondary | ICD-10-CM | POA: Insufficient documentation

## 2021-12-13 DIAGNOSIS — M6281 Muscle weakness (generalized): Secondary | ICD-10-CM | POA: Diagnosis not present

## 2021-12-13 DIAGNOSIS — G8929 Other chronic pain: Secondary | ICD-10-CM | POA: Insufficient documentation

## 2021-12-13 DIAGNOSIS — M5442 Lumbago with sciatica, left side: Secondary | ICD-10-CM | POA: Insufficient documentation

## 2021-12-13 NOTE — Therapy (Signed)
?OUTPATIENT PHYSICAL THERAPY TREATMENT NOTE ? ? ?Patient Name: Barbara Carlson ?MRN: [id number] ?DOB:05-20-1961, 61 y.o., female ?Today's Date: 12/13/2021 ? ?PCP: Eulas Post, MD ?REFERRING PROVIDER: Megan Salon, MD ? ?END OF SESSION:  ? PT End of Session - 12/13/21 1112   ? ? Visit Number 4   ? Date for PT Re-Evaluation 01/30/22   ? PT Start Time 1025   ? PT Stop Time 1057   ? PT Time Calculation (min) 32 min   ? Activity Tolerance Patient tolerated treatment well   ? Behavior During Therapy [facility] for tasks assessed/performed   ? ?  ?  ? ?  ? ? ? ? ?Past Medical History:  ?Diagnosis Date  ? Asthma   ? as a child  ? Basal cell carcinoma   ? Costochondritis, acute   ? couple years ago  ? Hyperlipidemia   ? diet controlled  ? Other dyspnea and respiratory abnormality   ? Palpitations   ? Post-operative nausea and vomiting   ? Tachycardia, unspecified   ? ?Past Surgical History:  ?Procedure Laterality Date  ? BASAL CELL CARCINOMA EXCISION  2004  ? right  abd  ? CERVIX LESION DESTRUCTION    ? cryotherapy of cervix treated by Dr. Carren Rang  ? GANGLION CYST EXCISION  2015  ? right hand  ? REFRACTIVE SURGERY  2003  ? Bil  ? TONSILLECTOMY Bilateral 1987  ? ?Patient Active Problem List  ? Diagnosis Date Noted  ? Cervical radiculitis 06/29/2021  ? Chronic left-sided low back pain with left-sided sciatica 04/11/2018  ? HYPERLIPIDEMIA-MIXED 09/25/2010  ? TACHYCARDIA 11/19/2008  ? PALPITATIONS 11/19/2008  ? ? ?REFERRING DIAG: M62.89 (ICD-10-CM) - Pelvic floor dysfunction in female ? ?THERAPY DIAG:  ?Other muscle spasm ? ?Muscle weakness (generalized) ? ?PERTINENT HISTORY: Back and neck - severe bulging discs, chronic back pain and neck pain ? ?PRECAUTIONS: None ? ?SUBJECTIVE: the new exercises increased back and leg tingling ? ?PAIN:  ?Are you having pain? No (more numbness and tingling) ? ? ? ?OBJECTIVE:  ?  ?  ?COGNITION: ?           Overall cognitive status: Within functional limits for tasks assessed              ?  ?   ?MUSCLE LENGTH: ?Hamstrings: Right 70 deg; Left 70 deg ?  ?  ?LUMBAR SPECIAL TESTS:  ?ASLR +Rt leg easier with compression; grabs Lt SI with SLR no compression ?  ?FUNCTIONAL TESTS:  ?Squat and SLR normal, squat very guarded and slow ?  ?GAIT: ?Comments: decreased rotation ?  ?POSTURE:  ?Trunk flexed ?  ?LUMBARAROM/PROM ?  ?A/PROM A/PROM  ?11/07/2021  ?Flexion 50%  ? ?  ?LE MMT: ?                        5/5 throughout ?MMT Right ?11/07/2021 Left ?11/07/2021  ?Hip flexion [facility] WFL  ?Hip extension      ?Hip abduction      ?Hip adduction      ?Hip internal rotation [facility] WFL  ?Hip external rotation 75% 75%  ?Knee flexion      ? ?PELVIC MMT: ?  ?MMT   ?11/07/2021  ?Vaginal 3/5 x 5 quick reps; 4 sec hold  ?   ? ?      PALPATION: ?  General  lumbar and upper traps; tension in spinal erectors throughout ?  ?  External Perineal Exam perineal body elevated ?              ?              Internal Pelvic Floor Lt side tight; release with some static holds, release with some deep breathing; holding breath with pelvic floor contraction ?  ?TONE: ?high ?  ?PROLAPSE: ?no ?  ?TODAY'S TREATMENT  ?Treatment: 12/13/21 ?Exercises  ?Bent knee raises and fall out -20x; added alt UE presses 20x ?butterfly ? ?Manual ?Lumbar and thoracic paraspinals ?Trigger Point Dry-Needling  ?Treatment instructions: Expect mild to moderate muscle soreness. S/S of pneumothorax if dry needled over a lung field, and to seek immediate medical attention should they occur. Patient verbalized understanding of these instructions and education. ? ?Patient Consent Given: Yes ?Education handout provided: Previously provided ?Muscles treated: thoracic multifidi (Lt); lumbar multifidi bil ?Electrical stimulation performed: No ?Parameters: N/A ?Treatment response/outcome: increased soft tissue length ? ? ?Treatment: 12/05/21 ?Exercises  ?Qped UE reaches ?Child pose ?Rocking in qped ? ?SELF CARE: ?Dilator usage and finding the correct one for  patient ? ?Manual ?Internal STM - levators bil ?Exteral to transverse peroneus, ischiocavernosis, bulbocavernosis with fascial release to vaginal vault ? ?Treatment: 11/23/21 ?Exercises  ?Knees to chest ?Hip rotation stretch ?Happy baby ?Butterfly stretch ?Diaphragmatic breathing with all stretches ?Manual ?Lumbar STM ?Trigger Point Dry-Needling  ?Treatment instructions: Expect mild to moderate muscle soreness. S/S of pneumothorax if dry needled over a lung field, and to seek immediate medical attention should they occur. Patient verbalized understanding of these instructions and education. ? ?Patient Consent Given: Yes ?Education handout provided: Yes ?Muscles treated: lumbar multifidi ?Electrical stimulation performed: No ?Parameters: N/A ?Treatment response/outcome: increased muscle length ?Patient confirms identification and approves physical therapist to perform internal soft tissue work  ?Internal STM transverse peroneus and bil levators ?Nuero Re-ed ?Education and cues for coordination of breathing and pelvic floor muscle contracting and relaxing at appropriate times ? ?  ?  ?PATIENT EDUCATION:  ?Education details: Access Code: [email or web address]  ?Person educated: Patient ?Education method: Explanation, Demonstration, Verbal cues, and Handouts ?Education comprehension: verbalized understanding and returned demonstration ?  ?  ?HOME EXERCISE PROGRAM: ?Access Code: [email or web address] ?URL: [email or web address] ?Date: 11/24/2021 ?Prepared by: Jari Favre ? ?Exercises ?- Seated Diaphragmatic Breathing  - 3 x daily - 7 x weekly - 1 sets - 10 reps ?- Seated Hamstring Stretch  - 1 x daily - 7 x weekly - 1 sets - 3 reps - 30 sec hold ?- Supine Single Knee to Chest Stretch  - 2 x daily - 7 x weekly - 1 sets - 5 reps - 10 sec hold ?- Happy Baby with Pelvic Floor Lengthening  - 1 x daily - 7 x weekly - 1 sets - 3 reps - 30 hold ?- Supine Bilateral Hip Internal Rotation Stretch  - 1 x daily - 7 x weekly - 1 sets - 3  reps - 30 sec hold ?- Supine Butterfly Groin Stretch  - 1 x daily - 7 x weekly - 1 sets - 3 reps - 30 sec hold ?  ?ASSESSMENT: ?  ?CLINICAL IMPRESSION: ? Pt was given transversus abdominus strengthening and did well using 's' sound to initiate contraction.  Pt had flare up of nerve pain after adding the quadruped ex at home.  Pt did better with exercises in supine to work on progressing core strength.  Pt needs to build core strength in order to not overuse pelvic floor and help with improved pelvic  floor down training.  Continue to work on [place] to achieve functional goals ?  ?OBJECTIVE IMPAIRMENTS decreased coordination, decreased endurance, decreased ROM, decreased strength, increased fascial restrictions, increased muscle spasms, impaired flexibility, impaired tone, postural dysfunction, and pain.  ?  ?ACTIVITY LIMITATIONS community activity, laundry, yard work, and personal relationship .  ?  ?PERSONAL FACTORS Time since onset of injury/illness/exacerbation and 1-2 comorbidities: bulging discs throughout lumbar and cervical regions  are also affecting patient's functional outcome.  ?  ?  ?REHAB POTENTIAL: Good ?  ?CLINICAL DECISION MAKING: Evolving/moderate complexity ?  ?EVALUATION COMPLEXITY: Moderate ?  ?  ?GOALS: ?Goals reviewed with patient? Yes ?  ?SHORT TERM GOALS: Target date: 12/05/2021 ?  ?Ind with initial HEP ?Baseline:  ?Goal status: met ?  ?2.  Report 20% less pain ?Baseline: same updated 12/05/21 ? ?Goal status: ongoing ?  ?  ?  ?LONG TERM GOALS: Target date: 01/30/2022 ?  ?Pt will be independent with advanced HEP to maintain improvements made throughout therapy ?  ?Baseline:  ?Goal status: INITIAL ?  ?2.  Pt will have 1/3 or less score of Marinoff scale ?  ?Baseline: 2/3 updated 12/05/21 ? ?Goal status: INITIAL ?  ?3.  Pt will report 50% reduction of pain throughout spine due to improvements in posture, strength, and muscle length  ?Baseline: same updated 12/05/21 ? ?Goal status: INITIAL ?  ?4.  Pt  will be able to lift at least 5 lb correctly for 10 reps without pain for improved functional activities such as yard work ?Baseline:  ?Goal status: INITIAL ?  ?  ?  ?  ?PLAN: ?PT FREQUENCY: 1x/week ?  ?PT DURATION: 12 weeks ?  ?

## 2021-12-19 ENCOUNTER — Encounter: Payer: Self-pay | Admitting: Family Medicine

## 2021-12-19 ENCOUNTER — Other Ambulatory Visit (HOSPITAL_COMMUNITY): Payer: Self-pay

## 2021-12-19 MED ORDER — METOPROLOL SUCCINATE ER 50 MG PO TB24
50.0000 mg | ORAL_TABLET | Freq: Every day | ORAL | 2 refills | Status: DC
  Filled 2021-12-19: qty 30, 30d supply, fill #0
  Filled 2022-01-18: qty 30, 30d supply, fill #1
  Filled 2022-02-16: qty 30, 30d supply, fill #2

## 2021-12-21 ENCOUNTER — Ambulatory Visit: Payer: 59 | Admitting: Physical Therapy

## 2021-12-21 DIAGNOSIS — M62838 Other muscle spasm: Secondary | ICD-10-CM

## 2021-12-21 DIAGNOSIS — G8929 Other chronic pain: Secondary | ICD-10-CM | POA: Diagnosis not present

## 2021-12-21 DIAGNOSIS — M6281 Muscle weakness (generalized): Secondary | ICD-10-CM

## 2021-12-21 DIAGNOSIS — M5442 Lumbago with sciatica, left side: Secondary | ICD-10-CM | POA: Diagnosis not present

## 2021-12-21 NOTE — Therapy (Signed)
?OUTPATIENT PHYSICAL THERAPY TREATMENT NOTE ? ? ?Patient Name: Barbara Carlson ?MRN: [id number] ?DOB:19-Jun-1961, 61 y.o., female ?Today's Date: 12/21/2021 ? ?PCP: Eulas Post, MD ?REFERRING PROVIDER: Eulas Post, MD ? ?END OF SESSION:  ? PT End of Session - 12/21/21 3016   ? ? Visit Number 5   ? Date for PT Re-Evaluation 01/30/22   ? PT Start Time [phone]   ? PT Stop Time 1015   ? PT Time Calculation (min) 39 min   ? Activity Tolerance Patient tolerated treatment well   ? Behavior During Therapy [facility] for tasks assessed/performed   ? ?  ?  ? ?  ? ? ? ? ? ?Past Medical History:  ?Diagnosis Date  ? Asthma   ? as a child  ? Basal cell carcinoma   ? Costochondritis, acute   ? couple years ago  ? Hyperlipidemia   ? diet controlled  ? Other dyspnea and respiratory abnormality   ? Palpitations   ? Post-operative nausea and vomiting   ? Tachycardia, unspecified   ? ?Past Surgical History:  ?Procedure Laterality Date  ? BASAL CELL CARCINOMA EXCISION  2004  ? right  abd  ? CERVIX LESION DESTRUCTION    ? cryotherapy of cervix treated by Dr. Carren Rang  ? GANGLION CYST EXCISION  2015  ? right hand  ? REFRACTIVE SURGERY  2003  ? Bil  ? TONSILLECTOMY Bilateral 1987  ? ?Patient Active Problem List  ? Diagnosis Date Noted  ? Cervical radiculitis 06/29/2021  ? Chronic left-sided low back pain with left-sided sciatica 04/11/2018  ? HYPERLIPIDEMIA-MIXED 09/25/2010  ? TACHYCARDIA 11/19/2008  ? PALPITATIONS 11/19/2008  ? ? ?REFERRING DIAG: M62.89 (ICD-10-CM) - Pelvic floor dysfunction in female ? ?THERAPY DIAG:  ?Other muscle spasm ? ?Muscle weakness (generalized) ? ?Chronic left-sided low back pain with left-sided sciatica ? ?PERTINENT HISTORY: Back and neck - severe bulging discs, chronic back pain and neck pain ? ?PRECAUTIONS: None ? ?SUBJECTIVE: I used the dilator and can do the 2nd one in the medium set. The tingling is still there and cleaned and worked in the yard and I think that also flared it up. ? ?PAIN:  ?Are you having  pain? No (more numbness and tingling) ? ? ? ?OBJECTIVE:  ?  ?  ?COGNITION: ?           Overall cognitive status: Within functional limits for tasks assessed              ?  ?  ?MUSCLE LENGTH: ?Hamstrings: Right 70 deg; Left 70 deg ?  ?  ?LUMBAR SPECIAL TESTS:  ?ASLR +Rt leg easier with compression; grabs Lt SI with SLR no compression ?  ?FUNCTIONAL TESTS:  ?Squat and SLR normal, squat very guarded and slow ?  ?GAIT: ?Comments: decreased rotation ?  ?POSTURE:  ?Trunk flexed ?  ?LUMBARAROM/PROM ?  ?A/PROM A/PROM  ?11/07/2021  ?Flexion 50%  ? ?  ?LE MMT: ?                        5/5 throughout ?MMT Right ?11/07/2021 Left ?11/07/2021  ?Hip flexion [facility] WFL  ?Hip extension      ?Hip abduction      ?Hip adduction      ?Hip internal rotation [facility] WFL  ?Hip external rotation 75% 75%  ?Knee flexion      ? ?PELVIC MMT: ?  ?MMT   ?11/07/2021  ?Vaginal 3/5 x 5 quick reps; 4 sec  hold  ?   ? ?      PALPATION: ?  General  lumbar and upper traps; tension in spinal erectors throughout ?  ?              External Perineal Exam perineal body elevated ?              ?              Internal Pelvic Floor Lt side tight; release with some static holds, release with some deep breathing; holding breath with pelvic floor contraction ?  ?TONE: ?high ?  ?PROLAPSE: ?no ?  ?TODAY'S TREATMENT  ?Treatment: 12/21/21 ?Exercises  ?Heel slide 20x ?Ball squeeze 20x ? ?Manual ?Cervial traction ?Patient confirms identification and approves physical therapist to perform internal soft tissue work - pubovaginalis/rectalis spasming and tight ? ?Neuro re-ed ?Cues to breathe with engaging the core throughout ? ? ?Treatment: 12/13/21 ?Exercises  ?Bent knee raises and fall out -20x; added alt UE presses 20x ?butterfly ? ?Manual ?Lumbar and thoracic paraspinals ?Trigger Point Dry-Needling  ?Treatment instructions: Expect mild to moderate muscle soreness. S/S of pneumothorax if dry needled over a lung field, and to seek immediate medical attention should they occur. Patient  verbalized understanding of these instructions and education. ? ?Patient Consent Given: Yes ?Education handout provided: Previously provided ?Muscles treated: thoracic multifidi (Lt); lumbar multifidi bil ?Electrical stimulation performed: No ?Parameters: N/A ?Treatment response/outcome: increased soft tissue length ? ? ?Treatment: 12/05/21 ?Exercises  ?Qped UE reaches ?Child pose ?Rocking in qped ? ?SELF CARE: ?Dilator usage and finding the correct one for patient ? ?Manual ?Internal STM - levators bil ?Exteral to transverse peroneus, ischiocavernosis, bulbocavernosis with fascial release to vaginal vault ? ?PATIENT EDUCATION:  ?Education details: Access Code: [email or web address]  ?Person educated: Patient ?Education method: Explanation, Demonstration, Verbal cues, and Handouts ?Education comprehension: verbalized understanding and returned demonstration ?  ?  ?HOME EXERCISE PROGRAM: ?Access Code: [email or web address] ?URL: [email or web address] ?Date: 11/24/2021 ?Prepared by: Jari Favre ? ?Exercises ?- Seated Diaphragmatic Breathing  - 3 x daily - 7 x weekly - 1 sets - 10 reps ?- Seated Hamstring Stretch  - 1 x daily - 7 x weekly - 1 sets - 3 reps - 30 sec hold ?- Supine Single Knee to Chest Stretch  - 2 x daily - 7 x weekly - 1 sets - 5 reps - 10 sec hold ?- Happy Baby with Pelvic Floor Lengthening  - 1 x daily - 7 x weekly - 1 sets - 3 reps - 30 hold ?- Supine Bilateral Hip Internal Rotation Stretch  - 1 x daily - 7 x weekly - 1 sets - 3 reps - 30 sec hold ?- Supine Butterfly Groin Stretch  - 1 x daily - 7 x weekly - 1 sets - 3 reps - 30 sec hold ?  ?ASSESSMENT: ?  ?CLINICAL IMPRESSION: ? Pt has tension in muscles along pubic ramus Rt side and was very taught from pubic ramus to posterior vaginal wall.  Pt continues to work on basic core strength with back supported for greater stability and reduced pain during intercourse and improved functional activities. ?  ?OBJECTIVE IMPAIRMENTS decreased coordination,  decreased endurance, decreased ROM, decreased strength, increased fascial restrictions, increased muscle spasms, impaired flexibility, impaired tone, postural dysfunction, and pain.  ?  ?ACTIVITY LIMITATIONS community activity, laundry, yard work, and personal relationship .  ?  ?PERSONAL FACTORS Time since onset of injury/illness/exacerbation and 1-2 comorbidities: bulging discs throughout lumbar and cervical regions  are also affecting patient's functional outcome.  ?  ?  ?REHAB POTENTIAL: Good ?  ?CLINICAL DECISION MAKING: Evolving/moderate complexity ?  ?EVALUATION COMPLEXITY: Moderate ?  ?  ?GOALS: ?Goals reviewed with patient? Yes ?  ?SHORT TERM GOALS: Target date: 12/05/2021 ?  ?Ind with initial HEP ?Baseline:  ?Goal status: met ?  ?2.  Report 20% less pain ?Baseline: same updated 12/05/21 ? ?Goal status: ongoing ?  ?  ?  ?LONG TERM GOALS: Target date: 01/30/2022 ?  ?Pt will be independent with advanced HEP to maintain improvements made throughout therapy ?  ?Baseline:  ?Goal status: INITIAL ?  ?2.  Pt will have 1/3 or less score of Marinoff scale ?  ?Baseline: 2/3 updated 12/05/21 ? ?Goal status: INITIAL ?  ?3.  Pt will report 50% reduction of pain throughout spine due to improvements in posture, strength, and muscle length  ?Baseline: same updated 12/05/21 ? ?Goal status: INITIAL ?  ?4.  Pt will be able to lift at least 5 lb correctly for 10 reps without pain for improved functional activities such as yard work ?Baseline:  ?Goal status: INITIAL ?  ?  ?  ?  ?PLAN: ?PT FREQUENCY: 1x/week ?  ?PT DURATION: 12 weeks ?  ?PLANNED INTERVENTIONS: Therapeutic exercises, Therapeutic activity, Neuromuscular re-education, Balance training, Gait training, Patient/Family education, Joint mobilization, Dry Needling, Electrical stimulation, Cryotherapy, Moist heat, Taping, Biofeedback, and Manual therapy ?  ?PLAN FOR NEXT SESSION:  f/u dilators with contract and relax and when doing core ex's, continue working on deep core  strength and internal STM release to Rt side ? ? ?Jule Ser, PT ?12/21/2021, 9:40 AM ? ?  ? ?

## 2021-12-28 ENCOUNTER — Encounter: Payer: 59 | Admitting: Physical Therapy

## 2022-01-02 NOTE — Therapy (Unsigned)
OUTPATIENT PHYSICAL THERAPY TREATMENT NOTE   Patient Name: Barbara Carlson MRN: [id number] DOB:[birth year], 61 y.o., female 42 Date: 01/03/2022  PCP: Eulas Post, MD REFERRING PROVIDER: Megan Salon, MD  END OF SESSION:   PT End of Session - 01/03/22 0940     Visit Number 6    Date for PT Re-Evaluation 01/30/22    PT Start Time 0934    PT Stop Time 1014    PT Time Calculation (min) 40 min    Activity Tolerance Patient tolerated treatment well                 Past Medical History:  Diagnosis Date   Asthma    as a child   Basal cell carcinoma    Costochondritis, acute    couple years ago   Hyperlipidemia    diet controlled   Other dyspnea and respiratory abnormality    Palpitations    Post-operative nausea and vomiting    Tachycardia, unspecified    Past Surgical History:  Procedure Laterality Date   BASAL CELL CARCINOMA EXCISION  2004   right  abd   CERVIX LESION DESTRUCTION     cryotherapy of cervix treated by Dr. Tera Mater CYST EXCISION  2015   right hand   REFRACTIVE SURGERY  2003   Bil   TONSILLECTOMY Bilateral 1987   Patient Active Problem List   Diagnosis Date Noted   Cervical radiculitis 06/29/2021   Chronic left-sided low back pain with left-sided sciatica 04/11/2018   HYPERLIPIDEMIA-MIXED 09/25/2010   TACHYCARDIA 11/19/2008   PALPITATIONS 11/19/2008    REFERRING DIAG: M62.89 (ICD-10-CM) - Pelvic floor dysfunction in female  THERAPY DIAG:  Other muscle spasm  Muscle weakness (generalized)  Chronic left-sided low back pain with left-sided sciatica  PERTINENT HISTORY: Back and neck - severe bulging discs, chronic back pain and neck pain  PRECAUTIONS: None  SUBJECTIVE: I used the dilator and there was one spot that felt it caused the same pain  It was on the left side  PAIN:  Are you having pain? No (more numbness and tingling)    OBJECTIVE:      COGNITION:            Overall cognitive status: Within  functional limits for tasks assessed                  MUSCLE LENGTH: Hamstrings: Right 70 deg; Left 70 deg     LUMBAR SPECIAL TESTS:  ASLR +Rt leg easier with compression; grabs Lt SI with SLR no compression   FUNCTIONAL TESTS:  Squat and SLR normal, squat very guarded and slow   GAIT: Comments: decreased rotation   POSTURE:  Trunk flexed   LUMBARAROM/PROM   A/PROM A/PROM  11/07/2021  Flexion 50%     LE MMT:                         5/5 throughout MMT Right 11/07/2021 Left 11/07/2021  Hip flexion [facility]  Hip extension      Hip abduction      Hip adduction      Hip internal rotation [facility] WFL  Hip external rotation 75% 75%  Knee flexion       PELVIC MMT:   MMT   11/07/2021  Vaginal 3/5 x 5 quick reps; 4 sec hold            PALPATION:   General  lumbar and upper  traps; tension in spinal erectors throughout                 External Perineal Exam perineal body elevated                             Internal Pelvic Floor Lt side tight; release with some static holds, release with some deep breathing; holding breath with pelvic floor contraction   TONE: high   PROLAPSE: no   TODAY'S TREATMENT  Treatment: 01/03/22 Self Care  Info on Dilators to release trigger point; oh nuts; pelvic wand  Manual Patient confirms identification and approves physical therapist to perform internal soft tissue work  Left iliococcygeus; pubococcygeus on left Left gluteals STM and addaday release Trigger Point Dry-Needling  Treatment instructions: Expect mild to moderate muscle soreness. S/S of pneumothorax if dry needled over a lung field, and to seek immediate medical attention should they occur. Patient verbalized understanding of these instructions and education.  Patient Consent Given: Yes Education handout provided: Previously provided Muscles treated: glute med, min, piriformis Electrical stimulation performed: No Parameters: N/A Treatment response/outcome: increased soft  tissue length   Treatment: 12/21/21 Exercises  Heel slide 20x Ball squeeze 20x  Manual Cervial traction Patient confirms identification and approves physical therapist to perform internal soft tissue work - pubovaginalis/rectalis spasming and tight  Neuro re-ed Cues to breathe with engaging the core throughout   Treatment: 12/13/21 Exercises  Bent knee raises and fall out -20x; added alt UE presses 20x butterfly  Manual Lumbar and thoracic paraspinals Trigger Point Dry-Needling  Treatment instructions: Expect mild to moderate muscle soreness. S/S of pneumothorax if dry needled over a lung field, and to seek immediate medical attention should they occur. Patient verbalized understanding of these instructions and education.  Patient Consent Given: Yes Education handout provided: Previously provided Muscles treated: thoracic multifidi (Lt); lumbar multifidi bil Electrical stimulation performed: No Parameters: N/A Treatment response/outcome: increased soft tissue length    PATIENT EDUCATION:  Education details: Access Code: [email or web address]  Person educated: Patient Education method: Consulting civil engineer, Demonstration, Verbal cues, and Handouts Education comprehension: verbalized understanding and returned demonstration     HOME EXERCISE PROGRAM: Access Code: [email or web address] URL: [email or web address] Date: 11/24/2021 Prepared by: Jari Favre  Exercises - Seated Diaphragmatic Breathing  - 3 x daily - 7 x weekly - 1 sets - 10 reps - Seated Hamstring Stretch  - 1 x daily - 7 x weekly - 1 sets - 3 reps - 30 sec hold - Supine Single Knee to Chest Stretch  - 2 x daily - 7 x weekly - 1 sets - 5 reps - 10 sec hold - Happy Baby with Pelvic Floor Lengthening  - 1 x daily - 7 x weekly - 1 sets - 3 reps - 30 hold - Supine Bilateral Hip Internal Rotation Stretch  - 1 x daily - 7 x weekly - 1 sets - 3 reps - 30 sec hold - Supine Butterfly Groin Stretch  - 1 x daily - 7 x weekly - 1  sets - 3 reps - 30 sec hold   ASSESSMENT:   CLINICAL IMPRESSION:  Pt has tension in muscles along pubic ramus Rt side and was very taught from pubic ramus to posterior vaginal wall.  Pt continues to work on basic core strength with back supported for greater stability and reduced pain during intercourse and improved functional activities.   OBJECTIVE IMPAIRMENTS decreased coordination, decreased endurance, decreased ROM, decreased strength,  increased fascial restrictions, increased muscle spasms, impaired flexibility, impaired tone, postural dysfunction, and pain.    ACTIVITY LIMITATIONS community activity, laundry, yard work, and personal relationship .    PERSONAL FACTORS Time since onset of injury/illness/exacerbation and 1-2 comorbidities: bulging discs throughout lumbar and cervical regions  are also affecting patient's functional outcome.      REHAB POTENTIAL: Good   CLINICAL DECISION MAKING: Evolving/moderate complexity   EVALUATION COMPLEXITY: Moderate     GOALS: Goals reviewed with patient? Yes   SHORT TERM GOALS: Target date: 12/05/2021   Ind with initial HEP Baseline:  Goal status: met   2.  Report 20% less pain Baseline: same updated 12/05/21  Goal status: not met       LONG TERM GOALS: Target date: 01/30/2022   Pt will be independent with advanced HEP to maintain improvements made throughout therapy   Baseline: 01/03/22  Goal status:ongoing   2.  Pt will have 1/3 or less score of Marinoff scale   Baseline: 2/3 updated 01/03/22   Goal status: ongoing   3.  Pt will report 50% reduction of pain throughout spine due to improvements in posture, strength, and muscle length  Baseline: same updated 01/03/22  Goal status: ongoing   4.  Pt will be able to lift at least 5 lb correctly for 10 reps without pain for improved functional activities such as yard work Baseline:  Goal status: ongoing         PLAN: PT FREQUENCY: 1x/week   PT DURATION: 12  weeks   PLANNED INTERVENTIONS: Therapeutic exercises, Therapeutic activity, Neuromuscular re-education, Balance training, Gait training, Patient/Family education, Joint mobilization, Dry Needling, Electrical stimulation, Cryotherapy, Moist heat, Taping, Biofeedback, and Manual therapy   PLAN FOR NEXT SESSION:  f/u dilators with pressure point; how was DN to gluteals and piriformis on left; wand info if needed; internal release to left side and re-assess tissues  Camillo Flaming Dmario Russom, PT 01/03/2022, 10:19 AM

## 2022-01-03 ENCOUNTER — Ambulatory Visit: Payer: 59 | Admitting: Physical Therapy

## 2022-01-03 DIAGNOSIS — G8929 Other chronic pain: Secondary | ICD-10-CM | POA: Diagnosis not present

## 2022-01-03 DIAGNOSIS — M6281 Muscle weakness (generalized): Secondary | ICD-10-CM | POA: Diagnosis not present

## 2022-01-03 DIAGNOSIS — M5442 Lumbago with sciatica, left side: Secondary | ICD-10-CM | POA: Diagnosis not present

## 2022-01-03 DIAGNOSIS — M62838 Other muscle spasm: Secondary | ICD-10-CM | POA: Diagnosis not present

## 2022-01-17 ENCOUNTER — Telehealth: Payer: Self-pay | Admitting: Family Medicine

## 2022-01-17 NOTE — Telephone Encounter (Signed)
Pt informed that rx has refills available

## 2022-01-17 NOTE — Telephone Encounter (Signed)
Patient states per MyChart she has a couple of Toprol left.  She states she needs a refill sent to [place].

## 2022-01-17 NOTE — Telephone Encounter (Signed)
Left a message for the pt to return my call in regards to refill. Rx was sent on 12/19/2021 with 2 refills.

## 2022-01-18 ENCOUNTER — Other Ambulatory Visit (HOSPITAL_COMMUNITY): Payer: Self-pay

## 2022-01-18 NOTE — Therapy (Signed)
OUTPATIENT PHYSICAL THERAPY TREATMENT NOTE   Patient Name: Barbara Carlson MRN: [id number] DOB:1960-10-18, 61 y.o., female 50 Date: 01/19/2022  PCP: Eulas Post, MD REFERRING PROVIDER: Earlie Raveling, NP  END OF SESSION:   PT End of Session - 01/19/22 1002     Visit Number 7    Date for PT Re-Evaluation 01/30/22    PT Start Time 0853    PT Stop Time 0928    PT Time Calculation (min) 35 min    Activity Tolerance Patient tolerated treatment well    Behavior During Therapy [facility] for tasks assessed/performed                  Past Medical History:  Diagnosis Date   Asthma    as a child   Basal cell carcinoma    Costochondritis, acute    couple years ago   Hyperlipidemia    diet controlled   Other dyspnea and respiratory abnormality    Palpitations    Post-operative nausea and vomiting    Tachycardia, unspecified    Past Surgical History:  Procedure Laterality Date   BASAL CELL CARCINOMA EXCISION  2004   right  abd   CERVIX LESION DESTRUCTION     cryotherapy of cervix treated by Dr. Tera Mater CYST EXCISION  2015   right hand   REFRACTIVE SURGERY  2003   Bil   TONSILLECTOMY Bilateral 1987   Patient Active Problem List   Diagnosis Date Noted   Cervical radiculitis 06/29/2021   Chronic left-sided low back pain with left-sided sciatica 04/11/2018   HYPERLIPIDEMIA-MIXED 09/25/2010   TACHYCARDIA 11/19/2008   PALPITATIONS 11/19/2008    REFERRING DIAG: M62.89 (ICD-10-CM) - Pelvic floor dysfunction in female  THERAPY DIAG:  Other muscle spasm  Muscle weakness (generalized)  Chronic left-sided low back pain with left-sided sciatica  PERTINENT HISTORY: Back and neck - severe bulging discs, chronic back pain and neck pain  PRECAUTIONS: None  SUBJECTIVE: I did okay using the relevium and the lidocaine cream together.   PAIN:  Are you having pain? No (more numbness and tingling)    OBJECTIVE:      COGNITION:            Overall  cognitive status: Within functional limits for tasks assessed                  MUSCLE LENGTH: Hamstrings: Right 70 deg; Left 70 deg     LUMBAR SPECIAL TESTS:  ASLR +Rt leg easier with compression; grabs Lt SI with SLR no compression   FUNCTIONAL TESTS:  Squat and SLR normal, squat very guarded and slow   GAIT: Comments: decreased rotation   POSTURE:  Trunk flexed   LUMBARAROM/PROM   A/PROM A/PROM  11/07/2021  Flexion 50%     LE MMT:                         5/5 throughout MMT Right 11/07/2021 Left 11/07/2021  Hip flexion [facility]  Hip extension      Hip abduction      Hip adduction      Hip internal rotation [facility] WFL  Hip external rotation 75% 75%  Knee flexion       PELVIC MMT:   MMT   11/07/2021  Vaginal 3/5 x 5 quick reps; 4 sec hold            PALPATION:   General  lumbar and upper  traps; tension in spinal erectors throughout                 External Perineal Exam perineal body elevated                             Internal Pelvic Floor Lt side tight; release with some static holds, release with some deep breathing; holding breath with pelvic floor contraction   TONE: high   PROLAPSE: no   TODAY'S TREATMENT  Treatment: 01/19/22 Manual Patient confirms identification and approves physical therapist to perform internal soft tissue work  Left iliococcygeus; pubococcygeus on left Left gluteals and bil lumbar STM Trigger Point Dry-Needling  Treatment instructions: Expect mild to moderate muscle soreness. S/S of pneumothorax if dry needled over a lung field, and to seek immediate medical attention should they occur. Patient verbalized understanding of these instructions and education.  Patient Consent Given: Yes Education handout provided: Previously provided Muscles treated: glute med, min, piriformis on Left ; bil lumbar multifidi Electrical stimulation performed: No Parameters: N/A Treatment response/outcome: increased soft tissue length  Treatment:  01/03/22 Self Care  Info on Dilators to release trigger point; oh nuts; pelvic wand  Manual Patient confirms identification and approves physical therapist to perform internal soft tissue work  Left iliococcygeus; pubococcygeus on left Left gluteals STM and addaday release Trigger Point Dry-Needling  Treatment instructions: Expect mild to moderate muscle soreness. S/S of pneumothorax if dry needled over a lung field, and to seek immediate medical attention should they occur. Patient verbalized understanding of these instructions and education.  Patient Consent Given: Yes Education handout provided: Previously provided Muscles treated: glute med, min, piriformis Electrical stimulation performed: No Parameters: N/A Treatment response/outcome: increased soft tissue length      PATIENT EDUCATION:  Education details: Access Code: [email or web address]  Person educated: Patient Education method: Consulting civil engineer, Demonstration, Verbal cues, and Handouts Education comprehension: verbalized understanding and returned demonstration     HOME EXERCISE PROGRAM: Access Code: [email or web address] URL: [email or web address] Date: 11/24/2021 Prepared by: Jari Favre  Exercises - Seated Diaphragmatic Breathing  - 3 x daily - 7 x weekly - 1 sets - 10 reps - Seated Hamstring Stretch  - 1 x daily - 7 x weekly - 1 sets - 3 reps - 30 sec hold - Supine Single Knee to Chest Stretch  - 2 x daily - 7 x weekly - 1 sets - 5 reps - 10 sec hold - Happy Baby with Pelvic Floor Lengthening  - 1 x daily - 7 x weekly - 1 sets - 3 reps - 30 hold - Supine Bilateral Hip Internal Rotation Stretch  - 1 x daily - 7 x weekly - 1 sets - 3 reps - 30 sec hold - Supine Butterfly Groin Stretch  - 1 x daily - 7 x weekly - 1 sets - 3 reps - 30 sec hold   ASSESSMENT:   CLINICAL IMPRESSION:  Pt has made some progress but is reaching a plateau at this time. She has other tools to use to reduce pain with intercourse that were  discussed today.  Pt will d/c today with all tools and HEP given.   OBJECTIVE IMPAIRMENTS decreased coordination, decreased endurance, decreased ROM, decreased strength, increased fascial restrictions, increased muscle spasms, impaired flexibility, impaired tone, postural dysfunction, and pain.    ACTIVITY LIMITATIONS community activity, laundry, yard work, and personal relationship .    PERSONAL FACTORS Time since onset of injury/illness/exacerbation and 1-2 comorbidities:  bulging discs throughout lumbar and cervical regions  are also affecting patient's functional outcome.      REHAB POTENTIAL: Good   CLINICAL DECISION MAKING: Evolving/moderate complexity   EVALUATION COMPLEXITY: Moderate     GOALS: Goals reviewed with patient? Yes   SHORT TERM GOALS: Target date: 12/05/2021   Ind with initial HEP Baseline:  Goal status: met   2.  Report 20% less pain Baseline: same updated 12/05/21  Goal status: not met       LONG TERM GOALS: Target date: 01/30/2022  GOALS updated 01/19/22  Pt will be independent with advanced HEP to maintain improvements made throughout therapy   Baseline:   Goal status:MET   2.  Pt will have 1/3 or less score of Marinoff scale   Baseline: 2/3 updated 01/19/22    Goal status: Not met   3.  Pt will report 50% reduction of pain throughout spine due to improvements in posture, strength, and muscle length  Baseline: same updated 01/03/22  Goal status: ongoing   4.  Pt will be able to lift at least 5 lb correctly for 10 reps without pain for improved functional activities such as yard work Baseline: still having pain with yard work, but is manageable Goal status: Not met         PLAN: PT FREQUENCY: 1x/week   PT DURATION: 12 weeks   PLANNED INTERVENTIONS: Therapeutic exercises, Therapeutic activity, Neuromuscular re-education, Balance training, Gait training, Patient/Family education, Joint mobilization, Dry Needling, Electrical  stimulation, Cryotherapy, Moist heat, Taping, Biofeedback, and Manual therapy   PLAN FOR NEXT SESSION:  d/c today  Jule Ser, PT 01/19/2022, 10:03 AM   PHYSICAL THERAPY DISCHARGE SUMMARY  Visits from Start of Care: 7  Current functional level related to goals / functional outcomes:   See above goals Remaining deficits: See above   Education / Equipment: HEP   Patient agrees to discharge. Patient goals were partially met. Patient is being discharged due to lack of progress.  Janey Genta Tilden Broz, PT 01/19/22 10:18 AM

## 2022-01-19 ENCOUNTER — Encounter: Payer: Self-pay | Admitting: Physical Therapy

## 2022-01-19 ENCOUNTER — Ambulatory Visit: Payer: 59 | Attending: Obstetrics & Gynecology | Admitting: Physical Therapy

## 2022-01-19 DIAGNOSIS — M6281 Muscle weakness (generalized): Secondary | ICD-10-CM | POA: Insufficient documentation

## 2022-01-19 DIAGNOSIS — M5442 Lumbago with sciatica, left side: Secondary | ICD-10-CM | POA: Diagnosis not present

## 2022-01-19 DIAGNOSIS — M62838 Other muscle spasm: Secondary | ICD-10-CM | POA: Diagnosis not present

## 2022-01-19 DIAGNOSIS — G8929 Other chronic pain: Secondary | ICD-10-CM | POA: Diagnosis not present

## 2022-01-26 DIAGNOSIS — H52223 Regular astigmatism, bilateral: Secondary | ICD-10-CM | POA: Diagnosis not present

## 2022-01-26 DIAGNOSIS — H5203 Hypermetropia, bilateral: Secondary | ICD-10-CM | POA: Diagnosis not present

## 2022-01-26 DIAGNOSIS — H524 Presbyopia: Secondary | ICD-10-CM | POA: Diagnosis not present

## 2022-02-09 ENCOUNTER — Encounter: Payer: Self-pay | Admitting: Family Medicine

## 2022-02-09 ENCOUNTER — Ambulatory Visit (INDEPENDENT_AMBULATORY_CARE_PROVIDER_SITE_OTHER): Payer: 59 | Admitting: Family Medicine

## 2022-02-09 VITALS — BP 110/60 | HR 76 | Temp 97.6°F | Ht 64.17 in | Wt 149.1 lb

## 2022-02-09 DIAGNOSIS — Z0000 Encounter for general adult medical examination without abnormal findings: Secondary | ICD-10-CM | POA: Diagnosis not present

## 2022-02-09 NOTE — Progress Notes (Signed)
Established Patient Office Visit  Subjective   Patient ID: Barbara Carlson, female    DOB: 1961-04-11  Age: 61 y.o. MRN: [id number]  Chief Complaint  Patient presents with   Annual Exam    HPI   Barbara Carlson is seen for physical exam.  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  She had some ongoing problems with cervical neck pain but currently stable.  She has longstanding history of intermittent palpitations and takes Toprol-XL 50 mg daily.  She also has hyperlipidemia treated with Crestor.  Health maintenance reviewed  -Sees GYN regularly for Pap smears and mammogram -Colonoscopy due 2024 -Tetanus due 2032 -Prior hepatitis C screen negative -Shingles vaccine completed  Family history-mother had atrial fibrillation.  Questionable history of dementia.  They think she may have died from stroke age 60.  Father had lung cancer.  She has a brother with hypertension.  Social history-married.  No children.  Works in Psychologist, educational.  Non-smoker.  No regular alcohol.  Past Medical History:  Diagnosis Date   Asthma    as a child   Basal cell carcinoma    Costochondritis, acute    couple years ago   Hyperlipidemia    diet controlled   Other dyspnea and respiratory abnormality    Palpitations    Post-operative nausea and vomiting    Tachycardia, unspecified    Past Surgical History:  Procedure Laterality Date   BASAL CELL CARCINOMA EXCISION  2004   right  abd   CERVIX LESION DESTRUCTION     cryotherapy of cervix treated by Dr. Tera Mater CYST EXCISION  2015   right hand   REFRACTIVE SURGERY  2003   Bil   TONSILLECTOMY Bilateral 1987    reports that she has never smoked. She has never used smokeless tobacco. She reports that she does not drink alcohol and does not use drugs. family history includes Cancer (age of onset: 85) in her father; Colon cancer in her maternal aunt and paternal aunt; Colon polyps in her mother; Heart  disease in her mother; Hyperlipidemia in her father; Hypertension in her father. No Known Allergies   Review of Systems  Constitutional:  Negative for chills, fever, malaise/fatigue and weight loss.  HENT:  Negative for hearing loss.   Eyes:  Negative for blurred vision and double vision.  Respiratory:  Negative for cough and shortness of breath.   Cardiovascular:  Negative for chest pain, palpitations and leg swelling.  Gastrointestinal:  Negative for abdominal pain, blood in stool, constipation and diarrhea.  Genitourinary:  Negative for dysuria.  Skin:  Negative for rash.  Neurological:  Negative for dizziness, speech change, seizures, loss of consciousness and headaches.  Psychiatric/Behavioral:  Negative for depression.       Objective:     BP 110/60 (BP Location: Left Arm, Patient Position: Sitting, Cuff Size: Normal)   Pulse 76   Temp 97.6 F (36.4 C) (Oral)   Ht 5' 4.17" (1.63 m)   Wt 149 lb 1.6 oz (67.6 kg)   LMP 09/05/2010   SpO2 98%   BMI 25.46 kg/m  BP Readings from Last 3 Encounters:  02/09/22 110/60  11/25/21 125/80  11/06/21 131/83   Wt Readings from Last 3 Encounters:  02/09/22 149 lb 1.6 oz (67.6 kg)  11/25/21 147 lb (66.7 kg)  11/06/21 147 lb (66.7 kg)      Physical Exam Vitals reviewed.  Constitutional:  Appearance: Normal appearance. She is well-developed.  HENT:     Head: Normocephalic and atraumatic.  Eyes:     Pupils: Pupils are equal, round, and reactive to light.  Neck:     Thyroid: No thyromegaly.  Cardiovascular:     Rate and Rhythm: Normal rate and regular rhythm.     Heart sounds: Normal heart sounds. No murmur heard. Pulmonary:     Effort: No respiratory distress.     Breath sounds: Normal breath sounds. No wheezing or rales.  Abdominal:     General: Bowel sounds are normal. There is no distension.     Palpations: Abdomen is soft. There is no mass.     Tenderness: There is no abdominal tenderness. There is no guarding or  rebound.  Musculoskeletal:        General: Normal range of motion.     Cervical back: Normal range of motion and neck supple.  Lymphadenopathy:     Cervical: No cervical adenopathy.  Skin:    Findings: No rash.  Neurological:     Mental Status: She is alert and oriented to person, place, and time.     Cranial Nerves: No cranial nerve deficit.  Psychiatric:        Behavior: Behavior normal.        Thought Content: Thought content normal.        Judgment: Judgment normal.      No results found for any visits on 02/09/22.    The 10-year ASCVD risk score (Arnett DK, et al., 2019) is: 2.9%    Assessment & Plan:   Problem List Items Addressed This Visit   None Visit Diagnoses     Physical exam    -  Primary   Relevant Orders   Hepatic function panel   TSH   CBC with Differential/Platelet   Lipid panel   Basic metabolic panel     Generally healthy 61 year old female.  She sees GYN and dermatology regularly.  Obtain screening labs as above. Continue annual flu vaccine.  Other health maintenance up-to-date.  No follow-ups on file.    Carolann Littler, MD

## 2022-02-10 LAB — HEPATIC FUNCTION PANEL
AG Ratio: 1.5 (calc) (ref 1.0–2.5)
ALT: 20 U/L (ref 6–29)
AST: 23 U/L (ref 10–35)
Albumin: 4.5 g/dL (ref 3.6–5.1)
Alkaline phosphatase (APISO): 69 U/L (ref 37–153)
Bilirubin, Direct: 0.1 mg/dL (ref 0.0–0.2)
Globulin: 3 g/dL (calc) (ref 1.9–3.7)
Indirect Bilirubin: 0.6 mg/dL (calc) (ref 0.2–1.2)
Total Bilirubin: 0.7 mg/dL (ref 0.2–1.2)
Total Protein: 7.5 g/dL (ref 6.1–8.1)

## 2022-02-10 LAB — CBC WITH DIFFERENTIAL/PLATELET
Absolute Monocytes: 534 cells/uL (ref 200–950)
Basophils Absolute: 30 cells/uL (ref 0–200)
Basophils Relative: 0.5 %
Eosinophils Absolute: 210 cells/uL (ref 15–500)
Eosinophils Relative: 3.5 %
HCT: 40 % (ref 35.0–45.0)
Hemoglobin: 13.2 g/dL (ref 11.7–15.5)
Lymphs Abs: 2148 cells/uL (ref 850–3900)
MCH: 29.9 pg (ref 27.0–33.0)
MCHC: 33 g/dL (ref 32.0–36.0)
MCV: 90.5 fL (ref 80.0–100.0)
MPV: 9.9 fL (ref 7.5–12.5)
Monocytes Relative: 8.9 %
Neutro Abs: 3078 cells/uL (ref 1500–7800)
Neutrophils Relative %: 51.3 %
Platelets: 267 10*3/uL (ref 140–400)
RBC: 4.42 10*6/uL (ref 3.80–5.10)
RDW: 12.1 % (ref 11.0–15.0)
Total Lymphocyte: 35.8 %
WBC: 6 10*3/uL (ref 3.8–10.8)

## 2022-02-10 LAB — BASIC METABOLIC PANEL
BUN: 15 mg/dL (ref 7–25)
CO2: 28 mmol/L (ref 20–32)
Calcium: 9.5 mg/dL (ref 8.6–10.4)
Chloride: 103 mmol/L (ref 98–110)
Creat: 0.76 mg/dL (ref 0.50–1.05)
Glucose, Bld: 79 mg/dL (ref 65–99)
Potassium: 4.1 mmol/L (ref 3.5–5.3)
Sodium: 139 mmol/L (ref 135–146)

## 2022-02-10 LAB — LIPID PANEL
Cholesterol: 185 mg/dL (ref <200)
HDL: 43 mg/dL — ABNORMAL LOW (ref >=50)
LDL Cholesterol (Calc): 100 mg/dL (calc) — ABNORMAL HIGH
Non-HDL Cholesterol (Calc): 142 mg/dL (calc) — ABNORMAL HIGH (ref <130)
Total CHOL/HDL Ratio: 4.3 (calc) (ref <5.0)
Triglycerides: 313 mg/dL — ABNORMAL HIGH (ref <150)

## 2022-02-10 LAB — TSH: TSH: 1.88 mIU/L (ref 0.40–4.50)

## 2022-02-12 ENCOUNTER — Other Ambulatory Visit (HOSPITAL_COMMUNITY): Payer: Self-pay

## 2022-02-12 ENCOUNTER — Encounter: Payer: Self-pay | Admitting: Family Medicine

## 2022-02-12 ENCOUNTER — Other Ambulatory Visit: Payer: Self-pay

## 2022-02-12 MED ORDER — ROSUVASTATIN CALCIUM 10 MG PO TABS
10.0000 mg | ORAL_TABLET | Freq: Every day | ORAL | 2 refills | Status: DC
  Filled 2022-02-12: qty 90, 90d supply, fill #0
  Filled 2022-05-10: qty 90, 90d supply, fill #1
  Filled 2022-08-14: qty 90, 90d supply, fill #2

## 2022-02-16 ENCOUNTER — Other Ambulatory Visit (HOSPITAL_COMMUNITY): Payer: Self-pay

## 2022-03-19 ENCOUNTER — Other Ambulatory Visit (HOSPITAL_COMMUNITY): Payer: Self-pay

## 2022-03-19 ENCOUNTER — Encounter: Payer: Self-pay | Admitting: Family Medicine

## 2022-03-19 MED ORDER — METOPROLOL SUCCINATE ER 50 MG PO TB24
50.0000 mg | ORAL_TABLET | Freq: Every day | ORAL | 3 refills | Status: DC
Start: 2022-03-19 — End: 2023-03-06
  Filled 2022-03-19: qty 90, 90d supply, fill #0
  Filled 2022-06-13: qty 90, 90d supply, fill #1
  Filled 2022-09-14: qty 90, 90d supply, fill #2
  Filled 2022-12-11: qty 90, 90d supply, fill #3

## 2022-05-10 ENCOUNTER — Other Ambulatory Visit (HOSPITAL_COMMUNITY): Payer: Self-pay

## 2022-06-08 DIAGNOSIS — H43812 Vitreous degeneration, left eye: Secondary | ICD-10-CM | POA: Diagnosis not present

## 2022-06-13 ENCOUNTER — Ambulatory Visit (INDEPENDENT_AMBULATORY_CARE_PROVIDER_SITE_OTHER): Payer: 59 | Admitting: Family Medicine

## 2022-06-13 ENCOUNTER — Other Ambulatory Visit (HOSPITAL_COMMUNITY): Payer: Self-pay

## 2022-06-13 VITALS — BP 116/70 | HR 87 | Ht 64.0 in | Wt 148.0 lb

## 2022-06-13 DIAGNOSIS — M5412 Radiculopathy, cervical region: Secondary | ICD-10-CM | POA: Diagnosis not present

## 2022-06-13 MED ORDER — PREDNISONE 50 MG PO TABS
50.0000 mg | ORAL_TABLET | Freq: Every day | ORAL | 0 refills | Status: DC
  Filled 2022-06-13: qty 5, 5d supply, fill #0

## 2022-06-13 NOTE — Progress Notes (Signed)
I, Peterson Lombard, LAT, ATC acting as a scribe for [name], MD.  Barbara Carlson is a 61 y.o. female who presents to [place] [facility] today for neck and shoulder pain. Pt was last seen by Dr. Georgina Snell on 06/28/21 for cervical radiculitis. Based on cervical MRI findings, an ESI was ordered and later performed on 07/12/21. Today, pt reports neck pain return. Pt was clipping a bush on Saturday, w/ her arms overhead. Pt locates pain to the left side of her neck, through L trapz, w/ radiating pain to L wrist.   Numbness/tingling: no Aggravates: driving Treatments tried: ice, heat  Dx imaging: 07/01/21 C-spine MRI  Pertinent review of systems: No fevers or chills  Relevant historical information: Left cervical radiculopathy improved with an epidural steroid injection November 2022.   Exam:  BP 116/70   Pulse 87   Ht '5\' 4"'$  (1.626 m)   Wt 148 lb (67.1 kg)   LMP 09/05/2010   SpO2 98%   BMI 25.40 kg/m  General: Well Developed, well nourished, and in no acute distress.   MSK: C-spine: Normal appearing Nontender to spinal midline. Tender palpation left cervical paraspinal musculature and left trapezius. Normal cervical motion. Positive left-sided Spurling's test. Upper extremity strength is intact. Reflexes are intact. Sensation is intact to light touch.  Left shoulder: Normal-appearing Normal motion and strength.  Pain is relieved with overhead shoulder position.    Lab and Radiology Results  EXAM: MRI CERVICAL SPINE WITHOUT CONTRAST   TECHNIQUE: Multiplanar, multisequence MR imaging of the cervical spine was performed. No intravenous contrast was administered.   COMPARISON:  None.   FINDINGS: Alignment: Cervical spine straightening.  No significant listhesis.   Vertebrae: No fracture, suspicious marrow lesion, or significant marrow edema.   Cord: Normal signal.   Posterior Fossa, vertebral arteries, paraspinal tissues: Unremarkable.    Disc levels:   C2-3: Asymmetric left facet arthrosis with facet ankylosis. No disc herniation or stenosis.   C3-4: Negative.   C4-5: Minimal disc bulging and facet arthrosis without stenosis.   C5-6: Moderate disc space narrowing. Broad-based posterior disc osteophyte complex results in mild left greater than right neural foraminal stenosis and slight ventral cord flattening without significant spinal stenosis.   C6-7: Moderate disc space narrowing. Disc bulging, uncovertebral spurring, and a left foraminal disc protrusion result in severe left neural foraminal stenosis and likely left C7 nerve root impingement. Patent spinal canal and right neural foramen.   C7-T1: Minimal facet arthrosis without disc herniation or stenosis.   IMPRESSION: 1. Left foraminal disc protrusion at C6-7 with severe left neural foraminal stenosis and C7 nerve root impingement. 2. Mild neural foraminal stenosis at C5-6.     Electronically Signed   By: Logan Bores M.D.   On: 07/03/2021 20:56   I, [name], personally (independently) visualized and performed the interpretation of the images attached in this note.'     Assessment and Plan: 61 y.o. female with left-sided neck pain radiating down the left arm.  This is consistent with prior episode of cervical radiculopathy.  I believe this to be an acute exacerbation of a chronic problem.  Symptoms are consistent with prior C7 cervical radiculopathy.  Plan for repeat epidural steroid injection.  Additionally short-term course of prednisone prescribed recommend restarting gabapentin as well.  She will keep me updated with how she feels.  May need physical therapy in the future as well.   PDMP not reviewed this encounter. Orders Placed This Encounter  Procedures   DG INJECT DIAG/THERA/INC NEEDLE/CATH/PLC EPI/LUMB/SAC W/IMG    Standing Status:   Future    Standing Expiration Date:   06/14/2023    Order Specific Question:   Reason for Exam  (SYMPTOM  OR DIAGNOSIS REQUIRED)    Answer:   Left cervical rad. Repeat ESI last one was 2022. Level and technique per radiology    Order Specific Question:   Is the patient pregnant?    Answer:   No    Order Specific Question:   Preferred Imaging Location?    Answer:   [facility]    Order Specific Question:   Radiology Contrast Protocol - do NOT remove file path    Answer:   [email or web address]   Meds ordered this encounter  Medications   predniSONE (DELTASONE) 50 MG tablet    Sig: Take 1 tablet (50 mg total) by mouth daily.    Dispense:  5 tablet    Refill:  0     Discussed warning signs or symptoms. Please see discharge instructions. Patient expresses understanding.   The above documentation has been reviewed and is accurate and complete [name], M.D.

## 2022-06-13 NOTE — Patient Instructions (Signed)
Thank you for coming in today.   Please call [facility] at [phone] to schedule your spine injection.    Take the prednisone now. Ok to stop early if you want.   Let me know if this is not working.   Ok to use gabapentin as well.

## 2022-06-25 ENCOUNTER — Ambulatory Visit
Admission: RE | Admit: 2022-06-25 | Discharge: 2022-06-25 | Disposition: A | Discharge status: Home / Self Care | Payer: 59 | Source: Ambulatory Visit | Attending: Family Medicine | Admitting: Family Medicine

## 2022-06-25 DIAGNOSIS — M47812 Spondylosis without myelopathy or radiculopathy, cervical region: Secondary | ICD-10-CM | POA: Diagnosis not present

## 2022-06-25 DIAGNOSIS — M5412 Radiculopathy, cervical region: Secondary | ICD-10-CM

## 2022-06-25 MED ORDER — TRIAMCINOLONE ACETONIDE 40 MG/ML IJ SUSP (RADIOLOGY)
60.0000 mg | Freq: Once | INTRAMUSCULAR | Status: AC
  Administered 2022-06-25: 60 mg via EPIDURAL

## 2022-06-25 MED ORDER — IOPAMIDOL (ISOVUE-M 300) INJECTION 61%
1.0000 mL | Freq: Once | INTRAMUSCULAR | Status: AC
  Administered 2022-06-25: 1 mL via EPIDURAL

## 2022-06-25 NOTE — Discharge Instructions (Signed)

## 2022-07-04 NOTE — Telephone Encounter (Signed)
Patient chart updated. 

## 2022-07-10 ENCOUNTER — Other Ambulatory Visit (HOSPITAL_BASED_OUTPATIENT_CLINIC_OR_DEPARTMENT_OTHER): Payer: Self-pay | Admitting: Obstetrics & Gynecology

## 2022-07-10 DIAGNOSIS — Z1231 Encounter for screening mammogram for malignant neoplasm of breast: Secondary | ICD-10-CM

## 2022-07-11 ENCOUNTER — Encounter (HOSPITAL_BASED_OUTPATIENT_CLINIC_OR_DEPARTMENT_OTHER): Payer: Self-pay | Admitting: Obstetrics & Gynecology

## 2022-07-11 ENCOUNTER — Ambulatory Visit (HOSPITAL_BASED_OUTPATIENT_CLINIC_OR_DEPARTMENT_OTHER)
Admission: RE | Admit: 2022-07-11 | Discharge: 2022-07-11 | Disposition: A | Discharge status: Home / Self Care | Payer: 59 | Source: Ambulatory Visit | Attending: Obstetrics & Gynecology | Admitting: Obstetrics & Gynecology

## 2022-07-11 DIAGNOSIS — Z1231 Encounter for screening mammogram for malignant neoplasm of breast: Secondary | ICD-10-CM | POA: Diagnosis not present

## 2022-07-16 ENCOUNTER — Ambulatory Visit (HOSPITAL_BASED_OUTPATIENT_CLINIC_OR_DEPARTMENT_OTHER)
Admission: RE | Admit: 2022-07-16 | Discharge: 2022-07-16 | Disposition: A | Discharge status: Home / Self Care | Payer: 59 | Source: Ambulatory Visit | Attending: Obstetrics & Gynecology | Admitting: Obstetrics & Gynecology

## 2022-07-16 DIAGNOSIS — Z780 Asymptomatic menopausal state: Secondary | ICD-10-CM | POA: Diagnosis not present

## 2022-07-16 DIAGNOSIS — E2839 Other primary ovarian failure: Secondary | ICD-10-CM | POA: Diagnosis not present

## 2022-07-16 DIAGNOSIS — M8589 Other specified disorders of bone density and structure, multiple sites: Secondary | ICD-10-CM | POA: Diagnosis not present

## 2022-07-23 ENCOUNTER — Other Ambulatory Visit (HOSPITAL_COMMUNITY): Payer: Self-pay

## 2022-07-23 ENCOUNTER — Ambulatory Visit (INDEPENDENT_AMBULATORY_CARE_PROVIDER_SITE_OTHER): Payer: 59 | Admitting: Family Medicine

## 2022-07-23 VITALS — BP 112/74 | HR 84 | Ht 64.0 in | Wt 147.0 lb

## 2022-07-23 DIAGNOSIS — M5412 Radiculopathy, cervical region: Secondary | ICD-10-CM | POA: Diagnosis not present

## 2022-07-23 MED ORDER — GABAPENTIN 300 MG PO CAPS
300.0000 mg | ORAL_CAPSULE | Freq: Three times a day (TID) | ORAL | 3 refills | Status: DC | PRN
  Filled 2022-07-23: qty 90, 30d supply, fill #0

## 2022-07-23 MED ORDER — PREDNISONE 50 MG PO TABS
50.0000 mg | ORAL_TABLET | Freq: Every day | ORAL | 0 refills | Status: DC
  Filled 2022-07-23: qty 5, 5d supply, fill #0

## 2022-07-23 NOTE — Patient Instructions (Signed)
Thank you for coming in today.   You should hear from MRI scheduling within 1 week. If you do not hear please let me know.    Please call [facility] at [phone] to schedule your spine injection.    Take the prednisone now.   Use the gabapentin as needed.   I will be out Monday the 18 and back Friday 22 and I will also be here on the 26th

## 2022-07-23 NOTE — Progress Notes (Unsigned)
I, Peterson Lombard, LAT, ATC acting as a scribe for [name], MD.  Barbara Carlson is a 61 y.o. female who presents to [facility] today for cont'd cervical radiculitis, along the left side of her neck with radiating pain into the left arm.  Patient was last seen by Dr. Georgina Snell on 06/13/2022 patient was advised to restart gabapentin and was prescribed prednisone.  A cervical ESI was ordered, and later performed on 06/25/2022.  Today, patient reports no relief from prior ESI. Pt has been cont to take the Gabapentin, which causes sleepiness, but does help her sleep at night. Pt locates pain to midlines and to the L-side of her neck and into L trapz. Pt is intermittently radiating distally to L wrist  The pain started worsening again about 6 weeks ago.  She was first seen for this recurrent episode of cervical radicular pain with me on November 1.  Aggravates: driving, sitting.   Dx imaging: 07/01/21 C-spine MRI   Pertinent review of systems: No fevers or chills  Relevant historical information: Cervical radiculopathy.   Exam:  BP 112/74   Pulse 84   Ht '5\' 4"'$  (1.626 m)   Wt 147 lb (66.7 kg)   LMP 09/05/2010   SpO2 99%   BMI 25.23 kg/m  General: Well Developed, well nourished, and in no acute distress.   MSK: C-spine: Normal appearing Nontender cervical midline. Normal cervical motion. Positive Spurling's test left side. Upper extremity strength and reflexes and sensation are intact.    Lab and Radiology Results   EXAM: MRI CERVICAL SPINE WITHOUT CONTRAST   TECHNIQUE: Multiplanar, multisequence MR imaging of the cervical spine was performed. No intravenous contrast was administered.   COMPARISON:  None.   FINDINGS: Alignment: Cervical spine straightening.  No significant listhesis.   Vertebrae: No fracture, suspicious marrow lesion, or significant marrow edema.   Cord: Normal signal.   Posterior Fossa, vertebral arteries,  paraspinal tissues: Unremarkable.   Disc levels:   C2-3: Asymmetric left facet arthrosis with facet ankylosis. No disc herniation or stenosis.   C3-4: Negative.   C4-5: Minimal disc bulging and facet arthrosis without stenosis.   C5-6: Moderate disc space narrowing. Broad-based posterior disc osteophyte complex results in mild left greater than right neural foraminal stenosis and slight ventral cord flattening without significant spinal stenosis.   C6-7: Moderate disc space narrowing. Disc bulging, uncovertebral spurring, and a left foraminal disc protrusion result in severe left neural foraminal stenosis and likely left C7 nerve root impingement. Patent spinal canal and right neural foramen.   C7-T1: Minimal facet arthrosis without disc herniation or stenosis.   IMPRESSION: 1. Left foraminal disc protrusion at C6-7 with severe left neural foraminal stenosis and C7 nerve root impingement. 2. Mild neural foraminal stenosis at C5-6.     Electronically Signed   By: Logan Bores M.D.   On: 07/03/2021 20:56 I, [name], personally (independently) visualized and performed the interpretation of the images attached in this note.    Assessment and Plan: 61 y.o. female with left cervical radiculopathy thought to be C7.  She had a similar episode a year ago which was treated with an epidural steroid injection.  She had a recurrence 6 weeks ago which we attempted a in the repeat epidural steroid injection on November 13.  This unfortunately did not work very well and she is having recurrent severe arm pain.  At this point she is not improving despite trial of conservative management including  home exercise program and repeat epidural steroid injection and gabapentin.  Will repeat MRI to further evaluate source of pain as her last MRI is now over a-year-old.  Anticipate repeat epidural steroid injection following MRI and potentially even surgical referral.  I will prescribe a short burst  of prednisone for temporary pain control.   PDMP not reviewed this encounter. Orders Placed This Encounter  Procedures   MR CERVICAL SPINE WO CONTRAST    Standing Status:   Future    Standing Expiration Date:   07/24/2023    Order Specific Question:   What is the patient's sedation requirement?    Answer:   No Sedation    Order Specific Question:   Does the patient have a pacemaker or implanted devices?    Answer:   No    Order Specific Question:   Preferred imaging location?    Answer:   [facility]process [facility] (table limit-350lbs)   DG INJECT DIAG/THERA/INC NEEDLE/CATH/PLC EPI/LUMB/SAC W/IMG    Standing Status:   Future    Standing Expiration Date:   07/24/2023    Order Specific Question:   Reason for Exam (SYMPTOM  OR DIAGNOSIS REQUIRED)    Answer:   repeat ESI. Left C7 symptoms. New MRI pending. Schedule this ESI after the MRI will be resulted    Order Specific Question:   Is the patient pregnant?    Answer:   No    Order Specific Question:   Preferred Imaging Location?    Answer:   [facility]    Order Specific Question:   Radiology Contrast Protocol - do NOT remove file path    Answer:   [email or web address]   Meds ordered this encounter  Medications   predniSONE (DELTASONE) 50 MG tablet    Sig: Take 1 tablet (50 mg total) by mouth daily.    Dispense:  5 tablet    Refill:  0   gabapentin (NEURONTIN) 300 MG capsule    Sig: Take 1 capsule (300 mg total) by mouth 3 (three) times daily as needed.    Dispense:  90 capsule    Refill:  3     Discussed warning signs or symptoms. Please see discharge instructions. Patient expresses understanding.   The above documentation has been reviewed and is accurate and complete [name], M.D.

## 2022-07-29 ENCOUNTER — Ambulatory Visit (INDEPENDENT_AMBULATORY_CARE_PROVIDER_SITE_OTHER): Payer: 59

## 2022-07-29 DIAGNOSIS — M5412 Radiculopathy, cervical region: Secondary | ICD-10-CM

## 2022-07-29 DIAGNOSIS — M542 Cervicalgia: Secondary | ICD-10-CM

## 2022-08-03 ENCOUNTER — Telehealth: Payer: Self-pay | Admitting: Family Medicine

## 2022-08-03 DIAGNOSIS — M5412 Radiculopathy, cervical region: Secondary | ICD-10-CM

## 2022-08-03 NOTE — Telephone Encounter (Signed)
Repeat epidural steroid injection already ordered

## 2022-08-03 NOTE — Progress Notes (Signed)
The MRI looks about the same as it previously.  Go ahead and proceed to repeat cervical epidural steroid injection already ordered.  The MRI does make sense for her current pain.  Retry another injection

## 2022-08-20 ENCOUNTER — Ambulatory Visit
Admission: RE | Admit: 2022-08-20 | Discharge: 2022-08-20 | Disposition: A | Discharge status: Home / Self Care | Payer: 59 | Source: Ambulatory Visit | Attending: Family Medicine | Admitting: Family Medicine

## 2022-08-20 DIAGNOSIS — M5412 Radiculopathy, cervical region: Secondary | ICD-10-CM | POA: Diagnosis not present

## 2022-08-20 MED ORDER — TRIAMCINOLONE ACETONIDE 40 MG/ML IJ SUSP (RADIOLOGY)
60.0000 mg | Freq: Once | INTRAMUSCULAR | Status: AC
  Administered 2022-08-20: 60 mg via EPIDURAL

## 2022-08-20 MED ORDER — IOPAMIDOL (ISOVUE-M 300) INJECTION 61%
1.0000 mL | Freq: Once | INTRAMUSCULAR | Status: AC
  Administered 2022-08-20: 1 mL via EPIDURAL

## 2022-08-20 NOTE — Discharge Instructions (Signed)

## 2022-08-27 ENCOUNTER — Encounter: Payer: Self-pay | Admitting: Gastroenterology

## 2022-09-04 DIAGNOSIS — L578 Other skin changes due to chronic exposure to nonionizing radiation: Secondary | ICD-10-CM | POA: Diagnosis not present

## 2022-09-04 DIAGNOSIS — L821 Other seborrheic keratosis: Secondary | ICD-10-CM | POA: Diagnosis not present

## 2022-09-04 DIAGNOSIS — D225 Melanocytic nevi of trunk: Secondary | ICD-10-CM | POA: Diagnosis not present

## 2022-09-04 DIAGNOSIS — Z85828 Personal history of other malignant neoplasm of skin: Secondary | ICD-10-CM | POA: Diagnosis not present

## 2022-09-04 DIAGNOSIS — D2261 Melanocytic nevi of right upper limb, including shoulder: Secondary | ICD-10-CM | POA: Diagnosis not present

## 2022-09-04 DIAGNOSIS — Z86018 Personal history of other benign neoplasm: Secondary | ICD-10-CM | POA: Diagnosis not present

## 2022-09-04 DIAGNOSIS — D2271 Melanocytic nevi of right lower limb, including hip: Secondary | ICD-10-CM | POA: Diagnosis not present

## 2022-09-04 DIAGNOSIS — D485 Neoplasm of uncertain behavior of skin: Secondary | ICD-10-CM | POA: Diagnosis not present

## 2022-09-14 ENCOUNTER — Encounter: Payer: Self-pay | Admitting: Gastroenterology

## 2022-09-14 ENCOUNTER — Other Ambulatory Visit (HOSPITAL_COMMUNITY): Payer: Self-pay | Admitting: Cardiology

## 2022-09-14 DIAGNOSIS — Z8249 Family history of ischemic heart disease and other diseases of the circulatory system: Secondary | ICD-10-CM

## 2022-09-20 ENCOUNTER — Ambulatory Visit (HOSPITAL_BASED_OUTPATIENT_CLINIC_OR_DEPARTMENT_OTHER)
Admission: RE | Admit: 2022-09-20 | Discharge: 2022-09-20 | Disposition: A | Discharge status: Home / Self Care | Payer: 59 | Source: Ambulatory Visit | Attending: Cardiology | Admitting: Cardiology

## 2022-09-20 DIAGNOSIS — Z8249 Family history of ischemic heart disease and other diseases of the circulatory system: Secondary | ICD-10-CM | POA: Insufficient documentation

## 2022-10-05 ENCOUNTER — Other Ambulatory Visit (HOSPITAL_BASED_OUTPATIENT_CLINIC_OR_DEPARTMENT_OTHER): Payer: Self-pay

## 2022-10-05 ENCOUNTER — Telehealth: Payer: 59 | Admitting: Physician Assistant

## 2022-10-05 DIAGNOSIS — U071 COVID-19: Secondary | ICD-10-CM

## 2022-10-05 MED ORDER — BENZONATATE 100 MG PO CAPS
100.0000 mg | ORAL_CAPSULE | Freq: Three times a day (TID) | ORAL | 0 refills | Status: DC | PRN
  Filled 2022-10-05: qty 30, 10d supply, fill #0

## 2022-10-05 NOTE — Progress Notes (Signed)
E-Visit  for Positive Covid Test Result   We are sorry you are not feeling well. We are here to help!  You have tested positive for COVID-19, meaning that you were infected with the novel coronavirus and could give the virus to others.  It is vitally important that you stay home so you do not spread it to others.      Isolation Instructions:   You are to isolate for 5 days from onset of your symptoms. If you must be around other household members who do not have symptoms, you need to make sure that both you and the family members are masking consistently with a high-quality mask.  After day 5 of isolation, if you have had no fever within 24 hours and you are feeling better, you can end isolation but need to mask for an additional 5 days.  After day 5 if you have a fever or are having significant symptoms, please isolate for full 10 days.  If you note any worsening of symptoms despite treatment, please seek an in-person evaluation ASAP. If you note any significant shortness of breath or any chest pain, please seek ER evaluation. Please do not delay care!   Go to the nearest hospital ED for assessment if fever/cough/breathlessness are severe or illness seems like a threat to life.    The following symptoms may appear 2-14 days after exposure: Fever Cough Shortness of breath or difficulty breathing Chills Repeated shaking with chills Muscle pain Headache Sore throat New loss of taste or smell Fatigue Congestion or runny nose Nausea or vomiting Diarrhea  You can use medication such as prescription cough medication called Tessalon Perles 100 mg. You may take 1-2 capsules every 8 hours as needed for cough. Giving your medical history you are someone for which an antiviral can be considered. These cannot be prescribed via EV alone and require an e-visit so if anything is worsening, please be seen so those can be started (must be started within first 5 days of symptoms)  You may also  take acetaminophen (Tylenol) as needed for fever.  HOME CARE: Only take medications as instructed by your medical team. Drink plenty of fluids and get plenty of rest. A steam or ultrasonic humidifier can help if you have congestion.   GET HELP RIGHT AWAY IF YOU HAVE EMERGENCY WARNING SIGNS.  Call 911 or proceed to your closest emergency facility if: You develop worsening high fever. Trouble breathing Bluish lips or face Persistent pain or pressure in the chest New confusion Inability to wake or stay awake You cough up blood. Your symptoms become more severe Inability to hold down food or fluids  This list is not all possible symptoms. Contact your medical provider for any symptoms that are severe or concerning to you.   Your e-visit answers were reviewed by a board certified advanced clinical practitioner to complete your personal care plan.  Depending on the condition, your plan could have included both over the counter or prescription medications.  If there is a problem please reply once you have received a response from your provider.  Your safety is important to [place].  If you have drug allergies check your prescription carefully.    You can use MyChart to ask questions about today's visit, request a non-urgent call back, or ask for a work or school excuse for 24 hours related to this e-Visit. If it has been greater than 24 hours you will need to follow up with your provider, or enter  a new e-Visit to address those concerns. You will get an e-mail in the next two days asking about your experience.  I hope that your e-visit has been valuable and will speed your recovery. Thank you for using e-visits.

## 2022-10-05 NOTE — Progress Notes (Signed)
I have spent 5 minutes in review of e-visit questionnaire, review and updating patient chart, medical decision making and response to patient.   Vasco Chong Cody Elayjah Chaney, PA-C    

## 2022-10-12 ENCOUNTER — Encounter: Payer: Self-pay | Admitting: Gastroenterology

## 2022-10-12 ENCOUNTER — Other Ambulatory Visit (HOSPITAL_COMMUNITY): Payer: Self-pay

## 2022-10-12 ENCOUNTER — Ambulatory Visit (AMBULATORY_SURGERY_CENTER): Payer: 59

## 2022-10-12 VITALS — Ht 64.0 in | Wt 142.0 lb

## 2022-10-12 DIAGNOSIS — L821 Other seborrheic keratosis: Secondary | ICD-10-CM | POA: Insufficient documentation

## 2022-10-12 DIAGNOSIS — Z800 Family history of malignant neoplasm of digestive organs: Secondary | ICD-10-CM

## 2022-10-12 DIAGNOSIS — D2271 Melanocytic nevi of right lower limb, including hip: Secondary | ICD-10-CM | POA: Insufficient documentation

## 2022-10-12 DIAGNOSIS — Z8371 Family history of adenomatous and serrated polyps: Secondary | ICD-10-CM

## 2022-10-12 DIAGNOSIS — Z86010 Personal history of colonic polyps: Secondary | ICD-10-CM

## 2022-10-12 MED ORDER — NA SULFATE-K SULFATE-MG SULF 17.5-3.13-1.6 GM/177ML PO SOLN
1.0000 | Freq: Once | ORAL | 0 refills | Status: AC
  Filled 2022-10-12: qty 354, 1d supply, fill #0

## 2022-10-12 NOTE — Progress Notes (Signed)
No egg or soy allergy known to patient  No issues known to pt with past sedation with any surgeries or procedures Patient denies ever being told they had issues or difficulty with intubation  No FH of Malignant Hyperthermia Pt is not on diet pills Pt is not on  home 02  Pt is not on blood thinners  Pt denies issues with constipation  No A fib or A flutter Have any cardiac testing pending--no Pt instructed to use Singlecare.com or GoodRx for a price reduction on prep  Patient's chart reviewed by Barbara Carlson CNRA prior to previsit and patient appropriate for the [facility].  Previsit completed and red dot placed by patient's name on their procedure day (on provider's schedule).    

## 2022-10-25 ENCOUNTER — Other Ambulatory Visit (HOSPITAL_COMMUNITY): Payer: Self-pay

## 2022-10-26 ENCOUNTER — Encounter: Payer: Self-pay | Admitting: Gastroenterology

## 2022-10-26 ENCOUNTER — Ambulatory Visit (AMBULATORY_SURGERY_CENTER): Payer: 59 | Admitting: Gastroenterology

## 2022-10-26 VITALS — BP 119/67 | HR 85 | Temp 97.3°F | Resp 16 | Ht 64.0 in | Wt 144.0 lb

## 2022-10-26 DIAGNOSIS — E785 Hyperlipidemia, unspecified: Secondary | ICD-10-CM | POA: Diagnosis not present

## 2022-10-26 DIAGNOSIS — Z1211 Encounter for screening for malignant neoplasm of colon: Secondary | ICD-10-CM | POA: Diagnosis not present

## 2022-10-26 DIAGNOSIS — D123 Benign neoplasm of transverse colon: Secondary | ICD-10-CM

## 2022-10-26 DIAGNOSIS — Z86010 Personal history of colonic polyps: Secondary | ICD-10-CM | POA: Diagnosis not present

## 2022-10-26 DIAGNOSIS — Z09 Encounter for follow-up examination after completed treatment for conditions other than malignant neoplasm: Secondary | ICD-10-CM

## 2022-10-26 DIAGNOSIS — Z800 Family history of malignant neoplasm of digestive organs: Secondary | ICD-10-CM

## 2022-10-26 DIAGNOSIS — D120 Benign neoplasm of cecum: Secondary | ICD-10-CM | POA: Diagnosis not present

## 2022-10-26 MED ORDER — SODIUM CHLORIDE 0.9 % IV SOLN: 500.0000 mL | Freq: Once | INTRAVENOUS | Status: DC

## 2022-10-26 NOTE — Progress Notes (Signed)
Sedate, gd SR, tolerated procedure well, VSS, report to RN 

## 2022-10-26 NOTE — Progress Notes (Signed)
[facility] History and Physical   Primary Care Physician:  Eulas Post, MD   Reason for Procedure:   History of colon polyps  Plan:    colonoscopy     HPI: Barbara Carlson is a 62 y.o. female  here for colonoscopy  surveillance - history of polyps removed 08/2017. Has 2 second degree relatives with colon cancer.   Patient denies any bowel symptoms at this time.  Otherwise feels well without any cardiopulmonary symptoms.   I have discussed risks / benefits of anesthesia and endoscopic procedure with Coralie Common and they wish to proceed with the exams as outlined today.    Past Medical History:  Diagnosis Date   Asthma    as a child   Basal cell carcinoma    Costochondritis, acute    couple years ago   Hyperlipidemia    diet controlled   Other dyspnea and respiratory abnormality    Palpitations    Post-operative nausea and vomiting    Tachycardia, unspecified     Past Surgical History:  Procedure Laterality Date   BASAL CELL CARCINOMA EXCISION  2004   right  abd   CERVIX LESION DESTRUCTION     cryotherapy of cervix treated by Dr. Carren Rang   COLONOSCOPY     GANGLION CYST EXCISION  2015   right hand   REFRACTIVE SURGERY  2003   Bil   TONSILLECTOMY Bilateral 1987    Prior to Admission medications   Medication Sig Start Date End Date Taking? Authorizing Provider  Calcium Carb-Cholecalciferol (CALCIUM 600 + D PO) Take by mouth daily.   Yes [provider]  Cholecalciferol (VITAMIN D3) 2000 units TABS Take by mouth daily.   Yes [provider]  levocetirizine (XYZAL ALLERGY 24HR) 5 MG tablet Take 5 mg by mouth as needed. 05/09/22  Yes [provider]  metoprolol succinate (TOPROL-XL) 50 MG 24 hr tablet Take 1 tablet (50 mg total) by mouth daily. Take with or immediately following a meal. 03/19/22  Yes Burchette, Alinda Sierras, MD  Multiple Vitamins-Calcium (ONE-A-DAY WOMENS PO) Take by mouth daily.   Yes [provider]   rosuvastatin (CRESTOR) 10 MG tablet Take 1 tablet (10 mg total) by mouth daily. 02/12/22  Yes Burchette, Alinda Sierras, MD  vitamin C (ASCORBIC ACID) 500 MG tablet Take 500 mg by mouth daily.   Yes [provider]  estradiol (ESTRACE) 0.1 MG/GM vaginal cream Place 1 gram vaginally twice weekly 11/06/21   Megan Salon, MD  gabapentin (NEURONTIN) 300 MG capsule Take 1 capsule (300 mg total) by mouth 3 (three) times daily as needed. 07/23/22   Gregor Hams, MD    Current Outpatient Medications  Medication Sig Dispense Refill   Calcium Carb-Cholecalciferol (CALCIUM 600 + D PO) Take by mouth daily.     Cholecalciferol (VITAMIN D3) 2000 units TABS Take by mouth daily.     levocetirizine (XYZAL ALLERGY 24HR) 5 MG tablet Take 5 mg by mouth as needed.     metoprolol succinate (TOPROL-XL) 50 MG 24 hr tablet Take 1 tablet (50 mg total) by mouth daily. Take with or immediately following a meal. 90 tablet 3   Multiple Vitamins-Calcium (ONE-A-DAY WOMENS PO) Take by mouth daily.     rosuvastatin (CRESTOR) 10 MG tablet Take 1 tablet (10 mg total) by mouth daily. 90 tablet 2   vitamin C (ASCORBIC ACID) 500 MG tablet Take 500 mg by mouth daily.     estradiol (ESTRACE) 0.1 MG/GM  vaginal cream Place 1 gram vaginally twice weekly 42.5 g 3   gabapentin (NEURONTIN) 300 MG capsule Take 1 capsule (300 mg total) by mouth 3 (three) times daily as needed. 90 capsule 3   Current Facility-Administered Medications  Medication Dose Route Frequency Provider Last Rate Last Admin   0.9 %  sodium chloride infusion  500 mL Intravenous Once Alonie Gazzola, Carlota Raspberry, MD       0.9 %  sodium chloride infusion  500 mL Intravenous Once Cherrise Occhipinti, Carlota Raspberry, MD        Allergies as of 10/26/2022   (No Known Allergies)    Family History  Problem Relation Age of Onset   Heart disease Mother        Atrial fibrillation   Colon polyps Mother    Cancer Father 40       lung   Hyperlipidemia Father    Hypertension Father    Colon  cancer Maternal Aunt    Colon cancer Paternal Aunt    Breast cancer Neg Hx    Esophageal cancer Neg Hx    Rectal cancer Neg Hx    Stomach cancer Neg Hx     Social History   Socioeconomic History   Marital status: Married    Spouse name: Not on file   Number of children: Not on file   Years of education: Not on file   Highest education level: Not on file  Occupational History   Occupation: Nurse Admin    Employer: [employer]  Tobacco Use   Smoking status: Never   Smokeless tobacco: Never  Vaping Use   Vaping Use: Never used  Substance and Sexual Activity   Alcohol use: No   Drug use: No   Sexual activity: Not on file  Other Topics Concern   Not on file  Social History Narrative   Not on file   Social Determinants of Health   Financial Resource Strain: Not on file  Food Insecurity: Not on file  Transportation Needs: Not on file  Physical Activity: Not on file  Stress: Not on file  Social Connections: Not on file  Intimate Partner Violence: Not on file    Review of Systems: All other review of systems negative except as mentioned in the HPI.  Physical Exam: Vital signs BP 104/62   Pulse (!) 102   Temp (!) 97.3 F (36.3 C) (Temporal)   Ht 5\' 4"  (1.626 m)   Wt 144 lb (65.3 kg)   LMP 09/05/2010   SpO2 99%   BMI 24.72 kg/m   General:   Alert,  Well-developed, pleasant and cooperative in NAD Lungs:  Clear throughout to auscultation.   Heart:  Regular rate and rhythm Abdomen:  Soft, nontender and nondistended.   Neuro/Psych:  Alert and cooperative. Normal mood and affect. A and O x 3  Jolly Mango, MD [facility]

## 2022-10-26 NOTE — Progress Notes (Signed)
VS completed by EC.   Pt's states no medical or surgical changes since previsit or office visit.  

## 2022-10-26 NOTE — Patient Instructions (Signed)
Handouts Provided:  Polyps and Diverticulosis  YOU HAD AN ENDOSCOPIC PROCEDURE TODAY AT THE [facility]:   Refer to the procedure report that was given to you for any specific questions about what was found during the examination.  If the procedure report does not answer your questions, please call your gastroenterologist to clarify.  If you requested that your care partner not be given the details of your procedure findings, then the procedure report has been included in a sealed envelope for you to review at your convenience later.  YOU SHOULD EXPECT: Some feelings of bloating in the abdomen. Passage of more gas than usual.  Walking can help get rid of the air that was put into your GI tract during the procedure and reduce the bloating. If you had a lower endoscopy (such as a colonoscopy or flexible sigmoidoscopy) you may notice spotting of blood in your stool or on the toilet paper. If you underwent a bowel prep for your procedure, you may not have a normal bowel movement for a few days.  Please Note:  You might notice some irritation and congestion in your nose or some drainage.  This is from the oxygen used during your procedure.  There is no need for concern and it should clear up in a day or so.  SYMPTOMS TO REPORT IMMEDIATELY:  Following lower endoscopy (colonoscopy or flexible sigmoidoscopy):  Excessive amounts of blood in the stool  Significant tenderness or worsening of abdominal pains  Swelling of the abdomen that is new, acute  Fever of 100F or higher  For urgent or emergent issues, a gastroenterologist can be reached at any hour by calling [phone]. Do not use MyChart messaging for urgent concerns.    DIET:  We do recommend a small meal at first, but then you may proceed to your regular diet.  Drink plenty of fluids but you should avoid alcoholic beverages for 24 hours.  ACTIVITY:  You should plan to take it easy for the rest of today and you should NOT DRIVE  or use heavy machinery until tomorrow (because of the sedation medicines used during the test).    FOLLOW UP: Our staff will call the number listed on your records the next business day following your procedure.  We will call around 7:15- 8:00 am to check on you and address any questions or concerns that you may have regarding the information given to you following your procedure. If we do not reach you, we will leave a message.     If any biopsies were taken you will be contacted by phone or by letter within the next 1-3 weeks.  Please call us at [phone] if you have not heard about the biopsies in 3 weeks.    SIGNATURES/CONFIDENTIALITY: You and/or your care partner have signed paperwork which will be entered into your electronic medical record.  These signatures attest to the fact that that the information above on your After Visit Summary has been reviewed and is understood.  Full responsibility of the confidentiality of this discharge information lies with you and/or your care-partner.  

## 2022-10-26 NOTE — Progress Notes (Signed)
Called to room to assist during endoscopic procedure.  Patient ID and intended procedure confirmed with present staff. Received instructions for my participation in the procedure from the performing physician.  

## 2022-10-26 NOTE — Op Note (Signed)
[facility] Patient Name: Barbara Carlson Procedure Date: 10/26/2022 2:08 PM MRN: [id number] Endoscopist: Remo Lipps P. Havery Moros , MD, [id number] Age: 62 Referring MD:  Date of Birth: 09-Aug-1961 Gender: Female Account #: [id number] Procedure:                Colonoscopy Indications:              High risk colon cancer surveillance: Personal                            history of colonic polyps. Colonoscopy 08/2017 - 7                            polyps - some TAs , SSPs - also with two second                            degree family members with colon cancer Medicines:                Monitored Anesthesia Care Procedure:                Pre-Anesthesia Assessment:                           - Prior to the procedure, a History and Physical                            was performed, and patient medications and                            allergies were reviewed. The patient's tolerance of                            previous anesthesia was also reviewed. The risks                            and benefits of the procedure and the sedation                            options and risks were discussed with the patient.                            All questions were answered, and informed consent                            was obtained. Prior Anticoagulants: The patient has                            taken no anticoagulant or antiplatelet agents. ASA                            Grade Assessment: II - A patient with mild systemic                            disease. After reviewing the risks and benefits,  the patient was deemed in satisfactory condition to                            undergo the procedure.                           After obtaining informed consent, the colonoscope                            was passed under direct vision. Throughout the                            procedure, the patient's blood pressure, pulse, and                            oxygen saturations  were monitored continuously. The                            Olympus [id number]) Colonoscope was                            introduced through the anus and advanced to the the                            cecum, identified by appendiceal orifice and                            ileocecal valve. The colonoscopy was performed                            without difficulty. The patient tolerated the                            procedure well. The quality of the bowel                            preparation was good. The ileocecal valve,                            appendiceal orifice, and rectum were photographed. Scope In: 2:16:23 PM Scope Out: 2:38:11 PM Scope Withdrawal Time: 0 hours 16 minutes 37 seconds  Total Procedure Duration: 0 hours 21 minutes 48 seconds  Findings:                 The perianal and digital rectal examinations were                            normal.                           A diminutive polyp was found in the cecum. The                            polyp was sessile. The polyp was removed with a  cold snare. Resection and retrieval were complete.                           A 3 mm polyp was found in the ileocecal valve. The                            polyp was sessile. The polyp was removed with a                            cold snare. Resection and retrieval were complete.                           Three sessile polyps were found in the transverse                            colon. The polyps were 3 to 4 mm in size. These                            polyps were removed with a cold snare. Resection                            and retrieval were complete.                           A few small-mouthed diverticula were found in the                            sigmoid colon and ascending colon.                           The colon was tortuous.                           Internal hemorrhoids were found. The hemorrhoids                            were  small.                           The exam was otherwise without abnormality. Complications:            No immediate complications. Estimated blood loss:                            Minimal. Estimated Blood Loss:     Estimated blood loss was minimal. Impression:               - One diminutive polyp in the cecum, removed with a                            cold snare. Resected and retrieved.                           - One 3 mm polyp at the ileocecal valve, removed  with a cold snare. Resected and retrieved.                           - Three 3 to 4 mm polyps in the transverse colon,                            removed with a cold snare. Resected and retrieved.                           - Diverticulosis in the sigmoid colon and in the                            ascending colon.                           - Tortuous colon.                           - Internal hemorrhoids.                           - The examination was otherwise normal.                           - The GI Genius (intelligent endoscopy module),                            computer-aided polyp detection system powered by AI                            was utilized to detect colorectal polyps through                            enhanced visualization during colonoscopy. Recommendation:           - Patient has a contact number available for                            emergencies. The signs and symptoms of potential                            delayed complications were discussed with the                            patient. Return to normal activities tomorrow.                            Written discharge instructions were provided to the                            patient.                           - Resume previous diet.                           -  Continue present medications.                           - Await pathology results. Remo Lipps P. Wanita Derenzo, MD 10/26/2022 2:44:17 PM This report has been signed  electronically.

## 2022-10-29 ENCOUNTER — Telehealth: Payer: Self-pay

## 2022-10-29 NOTE — Telephone Encounter (Signed)
  Follow up Call-     10/26/2022    1:20 PM  Call back number  Post procedure Call Back phone  # [phone]  Permission to leave phone message Yes     Patient questions:  Do you have a fever, pain , or abdominal swelling? No. Pain Score  0 *  Have you tolerated food without any problems? Yes.    Have you been able to return to your normal activities? Yes.    Do you have any questions about your discharge instructions: Diet   No. Medications  No. Follow up visit  No.  Do you have questions or concerns about your Care? No.  Actions: * If pain score is 4 or above: No action needed, pain <4.

## 2022-11-27 ENCOUNTER — Emergency Department (HOSPITAL_BASED_OUTPATIENT_CLINIC_OR_DEPARTMENT_OTHER): Payer: 59 | Admitting: Radiology

## 2022-11-27 ENCOUNTER — Other Ambulatory Visit (HOSPITAL_BASED_OUTPATIENT_CLINIC_OR_DEPARTMENT_OTHER): Payer: Self-pay

## 2022-11-27 ENCOUNTER — Emergency Department (HOSPITAL_BASED_OUTPATIENT_CLINIC_OR_DEPARTMENT_OTHER)
Admission: EM | Admit: 2022-11-27 | Discharge: 2022-11-27 | Disposition: A | Discharge status: Home / Self Care | Payer: 59 | Attending: Emergency Medicine | Admitting: Emergency Medicine

## 2022-11-27 ENCOUNTER — Other Ambulatory Visit: Payer: Self-pay

## 2022-11-27 DIAGNOSIS — I471 Supraventricular tachycardia, unspecified: Secondary | ICD-10-CM | POA: Insufficient documentation

## 2022-11-27 DIAGNOSIS — R000 Tachycardia, unspecified: Secondary | ICD-10-CM | POA: Diagnosis present

## 2022-11-27 LAB — BASIC METABOLIC PANEL
Anion gap: 11 (ref 5–15)
BUN: 17 mg/dL (ref 8–23)
CO2: 27 mmol/L (ref 22–32)
Calcium: 10.2 mg/dL (ref 8.9–10.3)
Chloride: 99 mmol/L (ref 98–111)
Creatinine, Ser: 0.8 mg/dL (ref 0.44–1.00)
GFR, Estimated: >60 mL/min (ref >60)
Glucose, Bld: 113 mg/dL — ABNORMAL HIGH (ref 70–99)
Potassium: 3.6 mmol/L (ref 3.5–5.1)
Sodium: 137 mmol/L (ref 135–145)

## 2022-11-27 LAB — TROPONIN I (HIGH SENSITIVITY): Troponin I (High Sensitivity): <2 ng/L (ref <18)

## 2022-11-27 LAB — CBC
HCT: 40.5 % (ref 36.0–46.0)
Hemoglobin: 13.7 g/dL (ref 12.0–15.0)
MCH: 30.2 pg (ref 26.0–34.0)
MCHC: 33.8 g/dL (ref 30.0–36.0)
MCV: 89.4 fL (ref 80.0–100.0)
Platelets: 333 10*3/uL (ref 150–400)
RBC: 4.53 MIL/uL (ref 3.87–5.11)
RDW: 12.1 % (ref 11.5–15.5)
WBC: 7.1 10*3/uL (ref 4.0–10.5)
nRBC: 0 % (ref 0.0–0.2)

## 2022-11-27 LAB — T4, FREE: Free T4: 0.75 ng/dL (ref 0.61–1.12)

## 2022-11-27 LAB — TSH: TSH: 2.482 u[IU]/mL (ref 0.350–4.500)

## 2022-11-27 LAB — D-DIMER, QUANTITATIVE: D-Dimer, Quant: <0.27 ug/mL-FEU (ref 0.00–0.50)

## 2022-11-27 LAB — MAGNESIUM: Magnesium: 2.1 mg/dL (ref 1.7–2.4)

## 2022-11-27 NOTE — ED Triage Notes (Signed)
Pt POV from work, caox4, ambulatory stating she has been tachycardic for approx 1.5 hrs. Hx tachycardia which she takes metoprolol daily for. Denies SOB or CP. SVT in triage.

## 2022-11-27 NOTE — ED Notes (Signed)
Patient present in SVT  rate 150s to 160s  Haf patient blow into syringe for vagal procedure.  Patient converted into sinus rhythm  EKG take.  Dr Karene Fry present in room

## 2022-11-27 NOTE — Discharge Instructions (Addendum)
Your SVT has resolved with vagal maneuvers.  Recommend you continue to take your home beta-blocker tonight.  A referral for outpatient follow-up with cardiology has been placed.  Your workup to include EKG, laboratory evaluation and chest x-ray were reassuring.

## 2022-11-27 NOTE — ED Provider Notes (Signed)
EMERGENCY DEPARTMENT AT [facility] Provider Note   CSN: [id number] Arrival date & time: 11/27/22  1443     History  Chief Complaint  Patient presents with   Tachycardia    ESTHA FEW is a 62 y.o. female.  HPI   62 year old female with medical history significant for HLD, palpitations and tachycardia/SVT on metoprolol nightly who presents to the emergency department with palpitations.  The patient states that she felt that she went into an abnormal heart rhythm around 1 PM today.  She tried Valsalva maneuvers in her office by bearing down but was unsuccessful so she presented to the emergency department for further evaluation.  On arrival, the patient attempted additional Valsalva maneuvers and initial EKG in triage Showed SVT but she subsequently converted to normal sinus rhythm.  She denies any chest pain, shortness of breath, headaches, abdominal pain, nausea, vomiting or any other infectious symptoms.  She is back to her baseline.  She does not currently have a cardiologist.  Home Medications Prior to Admission medications   Medication Sig Start Date End Date Taking? Authorizing Provider  Calcium Carb-Cholecalciferol (CALCIUM 600 + D PO) Take by mouth daily.   Yes [provider]  Cholecalciferol (VITAMIN D3) 2000 units TABS Take by mouth daily.   Yes [provider]  estradiol (ESTRACE) 0.1 MG/GM vaginal cream Place 1 gram vaginally twice weekly 11/06/21  Yes Jerene Bears, MD  gabapentin (NEURONTIN) 300 MG capsule Take 1 capsule (300 mg total) by mouth 3 (three) times daily as needed. 07/23/22  Yes Rodolph Bong, MD  ibuprofen (ADVIL) 200 MG tablet Take 200 mg by mouth every 6 (six) hours as needed.   Yes [provider]  levocetirizine (XYZAL ALLERGY 24HR) 5 MG tablet Take 5 mg by mouth as needed. 05/09/22  Yes [provider]  metoprolol succinate (TOPROL-XL) 50 MG 24 hr tablet Take 1 tablet (50 mg total) by mouth  daily. Take with or immediately following a meal. 03/19/22  Yes Burchette, Elberta Fortis, MD  Multiple Vitamins-Calcium (ONE-A-DAY WOMENS PO) Take by mouth daily.   Yes [provider]  rosuvastatin (CRESTOR) 10 MG tablet Take 1 tablet (10 mg total) by mouth daily. 02/12/22  Yes Burchette, Elberta Fortis, MD  vitamin C (ASCORBIC ACID) 500 MG tablet Take 500 mg by mouth daily.   Yes [provider]      Allergies    Patient has no known allergies.    Review of Systems   Review of Systems  Cardiovascular:  Positive for palpitations.  All other systems reviewed and are negative.   Physical Exam Updated Vital Signs BP 115/71   Pulse 95   Temp 98.2 F (36.8 C) (Oral)   Resp 20   Ht 5' 3.5" (1.613 m)   Wt 65.8 kg   LMP 09/05/2010   SpO2 96%   BMI 25.28 kg/m  Physical Exam Vitals and nursing note reviewed.  Constitutional:      General: She is not in acute distress.    Appearance: She is well-developed.  HENT:     Head: Normocephalic and atraumatic.  Eyes:     Conjunctiva/sclera: Conjunctivae normal.  Cardiovascular:     Rate and Rhythm: Regular rhythm. Tachycardia present.     Pulses: Normal pulses.  Pulmonary:     Effort: Pulmonary effort is normal. No respiratory distress.     Breath sounds: Normal breath sounds.  Abdominal:     Palpations: Abdomen is soft.  Tenderness: There is no abdominal tenderness.  Musculoskeletal:        General: No swelling.     Cervical back: Neck supple.  Skin:    General: Skin is warm and dry.     Capillary Refill: Capillary refill takes less than 2 seconds.  Neurological:     Mental Status: She is alert.  Psychiatric:        Mood and Affect: Mood normal.     ED Results / Procedures / Treatments   Labs (all labs ordered are listed, but only abnormal results are displayed) Labs Reviewed  BASIC METABOLIC PANEL - Abnormal; Notable for the following components:      Result Value   Glucose, Bld 113 (*)    All other components  within normal limits  CBC  MAGNESIUM  TSH  D-DIMER, QUANTITATIVE  T4, FREE  TROPONIN I (HIGH SENSITIVITY)  TROPONIN I (HIGH SENSITIVITY)    EKG EKG Interpretation  Date/Time:  Tuesday November 27 2022 15:04:54 EDT Ventricular Rate:  105 PR Interval:  195 QRS Duration: 97 QT Interval:  334 QTC Calculation: 442 R Axis:   78 Text Interpretation: Sinus tachycardia Biatrial enlargement RSR' in V1 or V2, right VCD or RVH Confirmed by Ernie Avena ([id number]) on 11/27/2022 3:31:02 PM  Radiology DG Chest 2 View  Result Date: 11/27/2022 CLINICAL DATA:  SVT. EXAM: CHEST - 2 VIEW COMPARISON:  Radiographs 10/16/2020. Included portions from cardiac CT 09/20/2022 FINDINGS: The cardiomediastinal contours are normal. Minor biapical pleuroparenchymal scarring, stable. Pulmonary vasculature is normal. No consolidation, pleural effusion, or pneumothorax. No acute osseous abnormalities are seen. IMPRESSION: No acute abnormality. Electronically Signed   By: Narda Rutherford M.D.   On: 11/27/2022 15:38    Procedures Procedures    Medications Ordered in ED Medications - No data to display  ED Course/ Medical Decision Making/ A&P                             Medical Decision Making Amount and/or Complexity of Data Reviewed Labs: ordered. Radiology: ordered.    62 year old female with medical history significant for HLD, palpitations and tachycardia/SVT on metoprolol nightly who presents to the emergency department with palpitations.  The patient states that she felt that she went into an abnormal heart rhythm around 1 PM today.  She tried Valsalva maneuvers in her office by bearing down but was unsuccessful so she presented to the emergency department for further evaluation.  On arrival, the patient attempted additional Valsalva maneuvers and initial EKG in triage Showed SVT but she subsequently converted to normal sinus rhythm.  She denies any chest pain, shortness of breath, headaches, abdominal pain,  nausea, vomiting or any other infectious symptoms.  She is back to her baseline.  She does not currently have a cardiologist.  On arrival, the patient was afebrile, tachycardic heart rate 158, mildly hypertensive BP 141/109, saturating 100% on room air, sinus tachycardia/SVT noted on cardiac telemetry.  EKG revealed SVT, ventricular rate 154, nonspecific ST changes.  A repeat EKG after Valsalva maneuvers revealed resolution of the patient's SVT was sinus tachycardia noted, no acute ST changes noted, ventricular rate 105.  Labs: DSH normal, D-dimer negative, troponin negative, CBC without leukocytosis or anemia, magnesium normal, BMP without significant electrolyte abnormality, normal renal function.  Chest x-ray was performed and was unremarkable.  Repeat EKG was performed and revealed sinus rhythm.  The patient had returned to sinus rhythm on cardiac telemetry and was  vitally stable on repeat assessment with no return of SVT.  An ambulatory referral for outpatient follow-up with cardiology was placed.  The patient was advised to continue her home beta-blocker, return precautions provided.  Overall stable for discharge.   Final Clinical Impression(s) / ED Diagnoses Final diagnoses:  SVT (supraventricular tachycardia)    Rx / DC Orders ED Discharge Orders          Ordered    Ambulatory referral to Cardiology       Comments: If you have not heard from the Cardiology office within the next 72 hours please call [phone].   11/27/22 1610              Ernie Avena, MD 11/27/22 1649

## 2022-11-27 NOTE — ED Notes (Signed)
Patient verbalizes understanding of discharge instructions. Opportunity for questioning and answers were provided. Patient discharged from ED.  °

## 2022-12-11 ENCOUNTER — Other Ambulatory Visit (HOSPITAL_COMMUNITY): Payer: Self-pay

## 2023-01-16 ENCOUNTER — Ambulatory Visit (INDEPENDENT_AMBULATORY_CARE_PROVIDER_SITE_OTHER): Payer: 59 | Admitting: Cardiology

## 2023-01-16 ENCOUNTER — Encounter (HOSPITAL_BASED_OUTPATIENT_CLINIC_OR_DEPARTMENT_OTHER): Payer: Self-pay | Admitting: Cardiology

## 2023-01-16 ENCOUNTER — Other Ambulatory Visit (HOSPITAL_BASED_OUTPATIENT_CLINIC_OR_DEPARTMENT_OTHER): Payer: Self-pay

## 2023-01-16 VITALS — BP 120/78 | HR 84 | Ht 63.5 in | Wt 148.6 lb

## 2023-01-16 DIAGNOSIS — Z7189 Other specified counseling: Secondary | ICD-10-CM

## 2023-01-16 DIAGNOSIS — R002 Palpitations: Secondary | ICD-10-CM

## 2023-01-16 DIAGNOSIS — I471 Supraventricular tachycardia, unspecified: Secondary | ICD-10-CM

## 2023-01-16 MED ORDER — DILTIAZEM HCL 30 MG PO TABS
30.0000 mg | ORAL_TABLET | Freq: Four times a day (QID) | ORAL | 11 refills | Status: AC | PRN
Start: 2023-01-16 — End: ?
  Filled 2023-01-16: qty 30, 8d supply, fill #0

## 2023-01-16 NOTE — Progress Notes (Signed)
Cardiology Office Note:    Date:  01/16/2023   ID:  Barbara Carlson, DOB 05-Feb-1961, MRN [id number]  PCP:  Kristian Covey, MD  Cardiologist:  Jodelle Red, MD  Referring MD: Ernie Avena, MD   CC: New patient evaluation for SVT  History of Present Illness:    Barbara Carlson is a 62 y.o. female with a hx of SVT, acute costochondritis, hyperlipidemia, asthma, basal cell carcinoma, who is seen as a new consult at the request of Ernie Avena, MD for the evaluation and management of SVT.  She presented to the [facility] 11/27/2022 after onset of an SVT episode that wasn't resolved with valsalva maneuvers. Her EKG showed SVT at 150 bpm. This episode lasted for about 2 hours total which was the longest ever. Afterwards she felt short of breath, although she also notes that she had asthma as a child.   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 Initially noticed elevated heart rates in Apr 04, 2009, never during the stressful time of caring for her mother.  -Frequency/Duration: Once in a blue moon at random times, often while sitting in her office. Usually brief. Longest lasting episode was 2 hours and had prompted her ED visit 11/2022. Since that visit, her SVT recurred once or twice. Then 2 more times recently a week after she officially retired. At that time she had been working outside in the heat. -Associated symptoms: She is able to feel her palpitations when her episodes occur. No other symptoms aside from causing her to feel frightened. Tracks her heart rate with her phone, staying over 100 bpm on average. Would like to get off metoprolol.  -Aggravating/alleviating factors: She is usually able to break her arrhythmia with valsalva maneuvers. She usually tries holding her nose and blowing out her ears. -Syncope/near syncope: None. -Prior cardiac history: None -Prior ECG: SVT at 150 bpm, [facility]. -Prior  workup/treatment: She had initially seen Dr. Shirlee Latch and completed cardiac workup, felt to be benign tachycardia and she was started on a baby dose of atenolol. In 2021-04-04, she saw Dr. Caryl Never. Had Kardia mobile and took ECG strips, told that it was likely anxiety. Her atenolol was switched to metoprolol.  -Possible medication interactions: None -OTC supplements:  Calcium, vitamin C, vitamin D3, One-a-day women's multivitamin. -Comorbidities: Hyperlipidemia - She reports that she has self decreased Crestor down to taking it every other day. -Exercise level: She admits that she is not in great shape. Every night she goes on a walk for exercise.  -Labs: TSH, kidney function/electrolytes, CBC reviewed. -Cardiac ROS: She denies any chest pain, shortness of breath, peripheral edema, lightheadedness, headaches, syncope, orthopnea, or PND.   Past Medical History:  Diagnosis Date   Asthma    as a child   Basal cell carcinoma    Costochondritis, acute    couple years ago   Hyperlipidemia    diet controlled   Other dyspnea and respiratory abnormality    Palpitations    Post-operative nausea and vomiting    Tachycardia, unspecified     Past Surgical History:  Procedure Laterality Date   BASAL CELL CARCINOMA EXCISION  April 05, 2003   right  abd   CERVIX LESION DESTRUCTION     cryotherapy of cervix treated by Dr. Chevis Pretty   COLONOSCOPY     GANGLION CYST EXCISION  2015   right hand   REFRACTIVE SURGERY  04-Apr-2002   Bil   TONSILLECTOMY Bilateral  1987    Current Medications: Current Outpatient Medications on File Prior to Visit  Medication Sig   Calcium Carb-Cholecalciferol (CALCIUM 600 + D PO) Take by mouth daily.   Cholecalciferol (VITAMIN D3) 2000 units TABS Take by mouth daily.   estradiol (ESTRACE) 0.1 MG/GM vaginal cream Place 1 gram vaginally twice weekly   gabapentin (NEURONTIN) 300 MG capsule Take 1 capsule (300 mg total) by mouth 3 (three) times daily as needed.   ibuprofen (ADVIL) 200 MG tablet  Take 200 mg by mouth every 6 (six) hours as needed.   levocetirizine (XYZAL ALLERGY 24HR) 5 MG tablet Take 5 mg by mouth as needed.   metoprolol succinate (TOPROL-XL) 50 MG 24 hr tablet Take 1 tablet (50 mg total) by mouth daily. Take with or immediately following a meal.   Multiple Vitamins-Calcium (ONE-A-DAY WOMENS PO) Take by mouth daily.   rosuvastatin (CRESTOR) 10 MG tablet Take 1 tablet (10 mg total) by mouth daily.   vitamin C (ASCORBIC ACID) 500 MG tablet Take 500 mg by mouth daily.   Current Facility-Administered Medications on File Prior to Visit  Medication   0.9 %  sodium chloride infusion     Allergies:   Patient has no known allergies.   Social History   Tobacco Use   Smoking status: Never   Smokeless tobacco: Never  Vaping Use   Vaping Use: Never used  Substance Use Topics   Alcohol use: No   Drug use: No    Family History: family history includes Cancer (age of onset: 68) in her father; Colon cancer in her maternal aunt and paternal aunt; Colon polyps in her mother; Heart disease in her mother; Hyperlipidemia in her father; Hypertension in her father. There is no history of Breast cancer, Esophageal cancer, Rectal cancer, or Stomach cancer.  ROS:   Please see the history of present illness.  Additional pertinent ROS: Constitutional: Negative for chills, fever, night sweats, unintentional weight loss  HENT: Negative for ear pain and hearing loss.   Eyes: Negative for loss of vision and eye pain.  Respiratory: Negative for cough, sputum, wheezing.   Cardiovascular: See HPI. Gastrointestinal: Negative for abdominal pain, melena, and hematochezia.  Genitourinary: Negative for dysuria and hematuria.  Musculoskeletal: Negative for falls and myalgias.  Skin: Negative for itching and rash.  Neurological: Negative for focal weakness, focal sensory changes and loss of consciousness.  Endo/Heme/Allergies: Does not bruise/bleed easily.     EKGs/Labs/Other Studies  Reviewed:    The following studies were reviewed today:  CT Cardiac Scoring  09/20/2022: IMPRESSION: Coronary calcium score of 0. This was 0 percentile for age-, race-, and sex-matched controls. Scattered aortic atherosclerosis noted.  Monitor 02/2015: Rare PVCs, no worrisome events/arrhythmias.    EKG:  EKG is personally reviewed.   01/16/2023:  NSR at 84 bpm  Recent Labs: 02/09/2022: ALT 20 11/27/2022: BUN 17; Creatinine, Ser 0.80; Hemoglobin 13.7; Magnesium 2.1; Platelets 333; Potassium 3.6; Sodium 137; TSH 2.482   Recent Lipid Panel    Component Value Date/Time   CHOL 185 02/09/2022 1509   TRIG 313 (H) 02/09/2022 1509   HDL 43 (L) 02/09/2022 1509   CHOLHDL 4.3 02/09/2022 1509   VLDL 55.4 (H) 02/07/2021 1202   LDLCALC 100 (H) 02/09/2022 1509   LDLDIRECT 112.0 02/07/2021 1202    Physical Exam:    VS:  BP 120/78 (BP Location: Left Arm, Patient Position: Sitting, Cuff Size: Large)   Pulse 84   Ht 5' 3.5" (1.613 m)   Wt  148 lb 9.6 oz (67.4 kg)   LMP 09/05/2010   BMI 25.91 kg/m     Wt Readings from Last 3 Encounters:  01/16/23 148 lb 9.6 oz (67.4 kg)  11/27/22 145 lb (65.8 kg)  10/26/22 144 lb (65.3 kg)    GEN: Well nourished, well developed in no acute distress HEENT: Normal, moist mucous membranes NECK: No JVD CARDIAC: regular rhythm, normal S1 and S2, no rubs or gallops. No murmur. VASCULAR: Radial and DP pulses 2+ bilaterally. No carotid bruits RESPIRATORY:  Clear to auscultation without rales, wheezing or rhonchi  ABDOMEN: Soft, non-tender, non-distended MUSCULOSKELETAL:  Ambulates independently SKIN: Warm and dry, no edema NEUROLOGIC:  Alert and oriented x 3. No focal neuro deficits noted. PSYCHIATRIC:  Normal affect    ASSESSMENT:    1. PSVT (paroxysmal supraventricular tachycardia)   2. Heart palpitations   3. Cardiac risk counseling   4. Counseling on health promotion and disease prevention    PLAN:    Paroxysmal SVT Palpitations -discussed  options for monitoring, treatment -continue metoprolol succinate -will add PRN short acting diltiazem if she has longer events -she knows vagal maneuvers -reviewed red flag signs that need immediate medical attention -will work to increase exercise, may be able to decrease metoprolol dose at follow up based on this  Cardiac risk counseling and prevention recommendations: -recommend heart healthy/Mediterranean diet, with whole grains, fruits, vegetable, fish, lean meats, nuts, and olive oil. Limit salt. -recommend moderate walking, 3-5 times/week for 30-50 minutes each session. Aim for at least 150 minutes.week. Goal should be pace of 3 miles/hours, or walking 1.5 miles in 30 minutes -recommend avoidance of tobacco products. Avoid excess alcohol. -ASCVD risk score: The 10-year ASCVD risk score (Arnett DK, et al., 2019) is: 3.6%   Values used to calculate the score:     Age: 32 years     Sex: Female     Is Non-Hispanic African American: No     Diabetic: No     Tobacco smoker: No     Systolic Blood Pressure: 120 mmHg     Is BP treated: No     HDL Cholesterol: 43 mg/dL     Total Cholesterol: 185 mg/dL    Plan for follow up: 6 months or sooner as needed.  Jodelle Red, MD, PhD, [facility]  [facility]    Medication Adjustments/Labs and Tests Ordered: Current medicines are reviewed at length with the patient today.  Concerns regarding medicines are outlined above.   Orders Placed This Encounter  Procedures   EKG 12-Lead   Meds ordered this encounter  Medications   diltiazem (CARDIZEM) 30 MG tablet    Sig: Take 1 tablet (30 mg total) by mouth every 6 (six) hours as needed (SVT).    Dispense:  30 tablet    Refill:  11   Patient Instructions  Medication Instructions:  START DILTIAZEM 30 MG EVERY 6 HOURS AS NEEDED   *If you need a refill on your cardiac medications before your next appointment, please call your pharmacy*  Lab  Work: NONE  Testing/Procedures: NONE  Follow-Up: At [facility], you and your health needs are our priority.  As part of our continuing mission to provide you with exceptional heart care, we have created designated Provider Care Teams.  These Care Teams include your primary Cardiologist (physician) and Advanced Practice Providers (APPs -  Physician Assistants and Nurse Practitioners) who all work together to provide you with the care you need, when you need  it.  We recommend signing up for the patient portal called "MyChart".  Sign up information is provided on this After Visit Summary.  MyChart is used to connect with patients for Virtual Visits (Telemedicine).  Patients are able to view lab/test results, encounter notes, upcoming appointments, etc.  Non-urgent messages can be sent to your provider as well.   To learn more about what you can do with MyChart, go to [email or web address].    Your next appointment:   6 month(s)  The format for your next appointment:   In Person  Provider:   Jodelle Red, MD       [facility],acting as a scribe for Jodelle Red, MD.,have documented all relevant documentation on the behalf of Jodelle Red, MD,as directed by  Jodelle Red, MD while in the presence of Jodelle Red, MD.  I, Jodelle Red, MD, have reviewed all documentation for this visit. The documentation on 03/07/23 for the exam, diagnosis, procedures, and orders are all accurate and complete.   Signed, Jodelle Red, MD PhD 01/16/2023     [facility]

## 2023-01-16 NOTE — Patient Instructions (Addendum)
Medication Instructions:  START DILTIAZEM 30 MG EVERY 6 HOURS AS NEEDED   *If you need a refill on your cardiac medications before your next appointment, please call your pharmacy*  Lab Work: NONE  Testing/Procedures: NONE  Follow-Up: At [facility], you and your health needs are our priority.  As part of our continuing mission to provide you with exceptional heart care, we have created designated Provider Care Teams.  These Care Teams include your primary Cardiologist (physician) and Advanced Practice Providers (APPs -  Physician Assistants and Nurse Practitioners) who all work together to provide you with the care you need, when you need it.  We recommend signing up for the patient portal called "MyChart".  Sign up information is provided on this After Visit Summary.  MyChart is used to connect with patients for Virtual Visits (Telemedicine).  Patients are able to view lab/test results, encounter notes, upcoming appointments, etc.  Non-urgent messages can be sent to your provider as well.   To learn more about what you can do with MyChart, go to [email or web address].    Your next appointment:   6 month(s)  The format for your next appointment:   In Person  Provider:   Jodelle Red, MD

## 2023-03-06 ENCOUNTER — Other Ambulatory Visit (HOSPITAL_BASED_OUTPATIENT_CLINIC_OR_DEPARTMENT_OTHER): Payer: Self-pay

## 2023-03-06 ENCOUNTER — Encounter: Payer: Self-pay | Admitting: Family Medicine

## 2023-03-06 ENCOUNTER — Other Ambulatory Visit: Payer: Self-pay

## 2023-03-06 ENCOUNTER — Ambulatory Visit (INDEPENDENT_AMBULATORY_CARE_PROVIDER_SITE_OTHER): Payer: 59 | Admitting: Family Medicine

## 2023-03-06 VITALS — BP 108/60 | HR 95 | Temp 97.6°F | Ht 63.5 in | Wt 146.4 lb

## 2023-03-06 DIAGNOSIS — Z0000 Encounter for general adult medical examination without abnormal findings: Secondary | ICD-10-CM | POA: Diagnosis not present

## 2023-03-06 DIAGNOSIS — M67449 Ganglion, unspecified hand: Secondary | ICD-10-CM | POA: Diagnosis not present

## 2023-03-06 MED ORDER — ROSUVASTATIN CALCIUM 10 MG PO TABS
10.0000 mg | ORAL_TABLET | Freq: Every day | ORAL | 2 refills | Status: DC
  Filled 2023-03-06: qty 90, 90d supply, fill #0
  Filled 2023-06-03: qty 90, 90d supply, fill #1
  Filled 2023-09-03: qty 90, 90d supply, fill #2

## 2023-03-06 MED ORDER — METOPROLOL SUCCINATE ER 50 MG PO TB24
50.0000 mg | ORAL_TABLET | Freq: Every day | ORAL | 3 refills | Status: DC
  Filled 2023-03-06: qty 90, 90d supply, fill #0
  Filled 2023-06-11: qty 90, 90d supply, fill #1
  Filled 2023-09-09: qty 90, 90d supply, fill #2
  Filled 2023-12-07: qty 90, 90d supply, fill #3

## 2023-03-06 NOTE — Progress Notes (Signed)
Established Patient Office Visit  Subjective   Patient ID: Barbara Carlson, female    DOB: [birth year]  Age: 62 y.o. MRN: [id number]  Chief Complaint  Patient presents with   Annual Exam    HPI   Barbara Carlson is seen for physical exam.  She just retired in June.  She will be feeling and occasionally part-time.  She has history of PSVT, hyperlipidemia.  She had recent coronary calcium score of 0.  Currently taking her rosuvastatin only couple times per week.  Generally doing well.  She has been doing some walking for exercise.  Health maintenance reviewed:  -She sees GYN for Pap smears and mammogram and these are up-to-date - Shingrix completed -Prior hepatitis C screening negative -Tetanus due 2032 -Colonoscopy due 2027  Family history-mother had atrial fibrillation.  Questionable history of dementia.  They think she may have died from stroke age 25.  Father had lung cancer and died age 20.[name]  She has 2 brothers with hypertension.   Social history-married.  No children.  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  Non-smoker.  No regular alcohol.  Past Medical History:  Diagnosis Date   Asthma    as a child   Basal cell carcinoma    Costochondritis, acute    couple years ago   Hyperlipidemia    diet controlled   Other dyspnea and respiratory abnormality    Palpitations    Post-operative nausea and vomiting    Tachycardia, unspecified    Past Surgical History:  Procedure Laterality Date   BASAL CELL CARCINOMA EXCISION  2004   right  abd   CERVIX LESION DESTRUCTION     cryotherapy of cervix treated by Dr. Chevis Pretty   COLONOSCOPY     GANGLION CYST EXCISION  2015   right hand   REFRACTIVE SURGERY  2003   Bil   TONSILLECTOMY Bilateral 1987    reports that she has never smoked. She has never used smokeless tobacco. She reports that she does not drink alcohol and does not use drugs. family history includes Cancer (age of onset: 77) in her father; Colon cancer  in her maternal aunt and paternal aunt; Colon polyps in her mother; Heart disease in her mother; Hyperlipidemia in her father; Hypertension in her father. No Known Allergies  Review of Systems  Constitutional:  Negative for chills, fever, malaise/fatigue and weight loss.  HENT:  Negative for hearing loss.   Eyes:  Negative for blurred vision and double vision.  Respiratory:  Negative for cough and shortness of breath.   Cardiovascular:  Negative for chest pain, palpitations and leg swelling.  Gastrointestinal:  Negative for abdominal pain, blood in stool, constipation and diarrhea.  Genitourinary:  Negative for dysuria.  Musculoskeletal:        She has small cystic type swelling left thumb just proximal to the nail which is tender to palpation.  Right index finger reveals painful slightly swollen DIP.  She also has translucent cystic lesion just proximal to the nail  Skin:  Negative for rash.  Neurological:  Negative for dizziness, speech change, seizures, loss of consciousness and headaches.  Psychiatric/Behavioral:  Negative for depression.       Objective:     BP 108/60 (BP Location: Left Arm, Patient Position: Sitting, Cuff Size: Normal)   Pulse 95   Temp 97.6 F (36.4 C) (Oral)   Ht 5' 3.5" (1.613 m)   Wt 146 lb 6.4 oz (66.4 kg)   LMP 09/05/2010  SpO2 98%   BMI 25.53 kg/m  BP Readings from Last 3 Encounters:  03/06/23 108/60  01/16/23 120/78  11/27/22 115/71   Wt Readings from Last 3 Encounters:  03/06/23 146 lb 6.4 oz (66.4 kg)  01/16/23 148 lb 9.6 oz (67.4 kg)  11/27/22 145 lb (65.8 kg)      Physical Exam Vitals reviewed.  Constitutional:      Appearance: She is well-developed.  HENT:     Head: Normocephalic and atraumatic.  Eyes:     Pupils: Pupils are equal, round, and reactive to light.  Neck:     Thyroid: No thyromegaly.  Cardiovascular:     Rate and Rhythm: Normal rate and regular rhythm.     Heart sounds: Normal heart sounds. No murmur  heard. Pulmonary:     Effort: No respiratory distress.     Breath sounds: Normal breath sounds. No wheezing or rales.  Abdominal:     General: Bowel sounds are normal. There is no distension.     Palpations: Abdomen is soft. There is no mass.     Tenderness: There is no abdominal tenderness. There is no guarding or rebound.  Musculoskeletal:        General: Normal range of motion.     Cervical back: Normal range of motion and neck supple.     Comments: She has nodule right DIP index finger consistent with Heberden's nodule.  Just proximal to the nail she has small translucent cystic area consistent with digital mucous cyst  Left thumb reveals slightly mobile dorsal cyst consistent with likely ganglion  Lymphadenopathy:     Cervical: No cervical adenopathy.  Skin:    Findings: No rash.  Neurological:     Mental Status: She is alert and oriented to person, place, and time.     Cranial Nerves: No cranial nerve deficit.  Psychiatric:        Behavior: Behavior normal.        Thought Content: Thought content normal.        Judgment: Judgment normal.      No results found for any visits on 03/06/23.  Last CBC Lab Results  Component Value Date   WBC 7.1 11/27/2022   HGB 13.7 11/27/2022   HCT 40.5 11/27/2022   MCV 89.4 11/27/2022   MCH 30.2 11/27/2022   RDW 12.1 11/27/2022   PLT 333 11/27/2022   Last metabolic panel Lab Results  Component Value Date   GLUCOSE 113 (H) 11/27/2022   NA 137 11/27/2022   K 3.6 11/27/2022   CL 99 11/27/2022   CO2 27 11/27/2022   BUN 17 11/27/2022   CREATININE 0.80 11/27/2022   GFRNONAA >60 11/27/2022   CALCIUM 10.2 11/27/2022   PROT 7.5 02/09/2022   ALBUMIN 4.6 02/07/2021   BILITOT 0.7 02/09/2022   ALKPHOS 68 02/07/2021   AST 23 02/09/2022   ALT 20 02/09/2022   ANIONGAP 11 11/27/2022   Last lipids Lab Results  Component Value Date   CHOL 185 02/09/2022   HDL 43 (L) 02/09/2022   LDLCALC 100 (H) 02/09/2022   LDLDIRECT 112.0  02/07/2021   TRIG 313 (H) 02/09/2022   CHOLHDL 4.3 02/09/2022   Last hemoglobin A1c No results found for: "HGBA1C" Last thyroid functions Lab Results  Component Value Date   TSH 2.482 11/27/2022      The 10-year ASCVD risk score (Arnett DK, et al., 2019) is: 2.9%    Assessment & Plan:   Problem List Items Addressed This Visit  None Visit Diagnoses     Physical exam    -  Primary   Relevant Orders   Lipid panel   CMP     Chronic problems as above stable.  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  Left thumb tender dorsal nodule consistent with likely ganglion cyst  -Patient would like to see orthopedic surgeon and referral will be placed -Obtain fasting labs as above.  She will schedule these -Discussed importance of regular exercise with minimum 150 minutes of moderate aerobic exercise per week -She plans to continue regular follow-up with GYN for Pap smear mammogram  No follow-ups on file.    [name], MD

## 2023-03-07 ENCOUNTER — Encounter (HOSPITAL_BASED_OUTPATIENT_CLINIC_OR_DEPARTMENT_OTHER): Payer: Self-pay | Admitting: Cardiology

## 2023-03-07 ENCOUNTER — Other Ambulatory Visit (INDEPENDENT_AMBULATORY_CARE_PROVIDER_SITE_OTHER): Payer: 59

## 2023-03-07 DIAGNOSIS — Z0000 Encounter for general adult medical examination without abnormal findings: Secondary | ICD-10-CM | POA: Diagnosis not present

## 2023-03-07 LAB — LIPID PANEL
Cholesterol: 266 mg/dL — ABNORMAL HIGH (ref 0–200)
HDL: 39.8 mg/dL (ref >39.00)
Total CHOL/HDL Ratio: 7
Triglycerides: 459 mg/dL — ABNORMAL HIGH (ref 0.0–149.0)

## 2023-03-07 LAB — COMPREHENSIVE METABOLIC PANEL
ALT: 20 U/L (ref 0–35)
AST: 21 U/L (ref 0–37)
Albumin: 4.1 g/dL (ref 3.5–5.2)
Alkaline Phosphatase: 65 U/L (ref 39–117)
BUN: 17 mg/dL (ref 6–23)
CO2: 29 mEq/L (ref 19–32)
Calcium: 9.7 mg/dL (ref 8.4–10.5)
Chloride: 103 mEq/L (ref 96–112)
Creatinine, Ser: 0.81 mg/dL (ref 0.40–1.20)
GFR: 78.39 mL/min (ref >60.00)
Glucose, Bld: 89 mg/dL (ref 70–99)
Potassium: 4 mEq/L (ref 3.5–5.1)
Sodium: 138 mEq/L (ref 135–145)
Total Bilirubin: 0.6 mg/dL (ref 0.2–1.2)
Total Protein: 7 g/dL (ref 6.0–8.3)

## 2023-03-07 LAB — LDL CHOLESTEROL, DIRECT: Direct LDL: 117 mg/dL

## 2023-03-13 DIAGNOSIS — M79641 Pain in right hand: Secondary | ICD-10-CM | POA: Diagnosis not present

## 2023-03-13 DIAGNOSIS — M13849 Other specified arthritis, unspecified hand: Secondary | ICD-10-CM | POA: Diagnosis not present

## 2023-03-13 DIAGNOSIS — R2230 Localized swelling, mass and lump, unspecified upper limb: Secondary | ICD-10-CM | POA: Diagnosis not present

## 2023-03-13 DIAGNOSIS — M79642 Pain in left hand: Secondary | ICD-10-CM | POA: Diagnosis not present

## 2023-06-03 ENCOUNTER — Encounter: Payer: Self-pay | Admitting: Family Medicine

## 2023-06-05 ENCOUNTER — Other Ambulatory Visit (HOSPITAL_BASED_OUTPATIENT_CLINIC_OR_DEPARTMENT_OTHER): Payer: Self-pay

## 2023-06-11 ENCOUNTER — Encounter: Payer: Self-pay | Admitting: Family Medicine

## 2023-06-11 ENCOUNTER — Other Ambulatory Visit (HOSPITAL_COMMUNITY): Payer: Self-pay

## 2023-06-11 MED ORDER — LEVOCETIRIZINE DIHYDROCHLORIDE 5 MG PO TABS
5.0000 mg | ORAL_TABLET | Freq: Every day | ORAL | 3 refills | Status: DC
  Filled 2023-06-11: qty 90, 90d supply, fill #0
  Filled 2023-09-09: qty 90, 90d supply, fill #1
  Filled 2023-12-07: qty 90, 90d supply, fill #2
  Filled 2024-03-02: qty 90, 90d supply, fill #3

## 2023-06-12 ENCOUNTER — Other Ambulatory Visit (HOSPITAL_BASED_OUTPATIENT_CLINIC_OR_DEPARTMENT_OTHER): Payer: Self-pay

## 2023-07-16 ENCOUNTER — Ambulatory Visit (HOSPITAL_BASED_OUTPATIENT_CLINIC_OR_DEPARTMENT_OTHER)
Admission: RE | Admit: 2023-07-16 | Discharge: 2023-07-16 | Disposition: A | Discharge status: Home / Self Care | Payer: 59 | Source: Ambulatory Visit | Attending: Obstetrics & Gynecology | Admitting: Obstetrics & Gynecology

## 2023-07-16 ENCOUNTER — Encounter (HOSPITAL_BASED_OUTPATIENT_CLINIC_OR_DEPARTMENT_OTHER): Payer: Self-pay | Admitting: Radiology

## 2023-07-16 DIAGNOSIS — Z1231 Encounter for screening mammogram for malignant neoplasm of breast: Secondary | ICD-10-CM | POA: Diagnosis not present

## 2023-07-30 ENCOUNTER — Encounter (HOSPITAL_BASED_OUTPATIENT_CLINIC_OR_DEPARTMENT_OTHER): Payer: Self-pay | Admitting: Cardiology

## 2023-07-30 ENCOUNTER — Ambulatory Visit (HOSPITAL_BASED_OUTPATIENT_CLINIC_OR_DEPARTMENT_OTHER): Payer: 59 | Admitting: Cardiology

## 2023-07-30 VITALS — BP 130/62 | HR 98 | Ht 64.0 in | Wt 143.0 lb

## 2023-07-30 DIAGNOSIS — Z7189 Other specified counseling: Secondary | ICD-10-CM

## 2023-07-30 DIAGNOSIS — R002 Palpitations: Secondary | ICD-10-CM

## 2023-07-30 DIAGNOSIS — E7800 Pure hypercholesterolemia, unspecified: Secondary | ICD-10-CM | POA: Diagnosis not present

## 2023-07-30 DIAGNOSIS — I471 Supraventricular tachycardia, unspecified: Secondary | ICD-10-CM | POA: Diagnosis not present

## 2023-07-30 NOTE — Patient Instructions (Addendum)

## 2023-07-30 NOTE — Progress Notes (Signed)
Cardiology Office Note:  .   Date:  07/30/2023  ID:  Arlee Muslim, DOB 08-10-1961, MRN [id number] PCP: Kristian Covey, MD  [facility] Providers Cardiologist:  Jodelle Red, MD {  History of Present Illness: [name]   Barbara Carlson is a 62 y.o. female with a hx of SVT, costochondritis, hyperlipidemia, asthma, basal cell carcinoma. I met her 01/16/23 as a new consult at the request of Ernie Avena, MD for the evaluation and management of SVT.   Pertinent CV history: She presented to the [facility] 11/27/2022 after onset of an SVT episode that wasn't resolved with valsalva maneuvers. Her EKG showed SVT at 150 bpm. This episode lasted for about 2 hours total which was the longest ever. Had noted intermittent SVT since 2010. Calcium score 09/2022 was 0. Monitor in 2016 did not show events.   Today: Has not had to take any cardizem, has been able to manage with vagal maneuvers. Has had 8 episodes, tracked on her phone. Some are related to stressful timing, others do not seem to be related. Had one episode in November that lasted about 20 minutes, another episode lasted about 30 minutes. These have neem the longest events. Feels fatigued after the longer events.  Is retired but staying active with projects. Avoids caffeine.  ROS: Denies chest pain, shortness of breath at rest or with normal exertion. No PND, orthopnea, LE edema or unexpected weight gain. No syncope. ROS otherwise negative except as noted.   Studies Reviewed: [name]    EKG:       Physical Exam:   VS:  BP 130/62 (BP Location: Left Arm, Patient Position: Sitting, Cuff Size: Normal)   Pulse 98   Ht 5\' 4"  (1.626 m)   Wt 143 lb (64.9 kg)   LMP 09/05/2010   SpO2 97%   BMI 24.55 kg/m    Wt Readings from Last 3 Encounters:  07/30/23 143 lb (64.9 kg)  03/06/23 146 lb 6.4 oz (66.4 kg)  01/16/23 148 lb 9.6 oz (67.4 kg)    GEN: Well nourished, well developed in no acute distress HEENT: Normal, moist mucous  membranes NECK: No JVD CARDIAC: regular rhythm, normal S1 and S2, no rubs or gallops. No murmur. VASCULAR: Radial and DP pulses 2+ bilaterally. No carotid bruits RESPIRATORY:  Clear to auscultation without rales, wheezing or rhonchi  ABDOMEN: Soft, non-tender, non-distended MUSCULOSKELETAL:  Ambulates independently SKIN: Warm and dry, no edema NEUROLOGIC:  Alert and oriented x 3. No focal neuro deficits noted. PSYCHIATRIC:  Normal affect    ASSESSMENT AND PLAN: .    Paroxysmal SVT Palpitations -discussed options for monitoring, treatment. For now, we will stay the course. If symptoms get more frequent, longer, or more bothersome she will let me know and we will refer to EP to discuss ablation. -continue metoprolol succinate daily -has PRN short acting diltiazem if she has longer events, has not taken -she knows vagal maneuvers -reviewed red flag signs that need immediate medical attention  Hypercholesterolemia -on rosuvastatin, last direct LDL 117  CV risk counseling and prevention -recommend heart healthy/Mediterranean diet, with whole grains, fruits, vegetable, fish, lean meats, nuts, and olive oil. Limit salt. -recommend moderate walking, 3-5 times/week for 30-50 minutes each session. Aim for at least 150 minutes.week. Goal should be pace of 3 miles/hours, or walking 1.5 miles in 30 minutes -recommend avoidance of tobacco products. Avoid excess alcohol. -ASCVD risk score: The 10-year ASCVD risk score (Arnett DK, et al., 2019) is: 5.7%  Values used to calculate the score:     Age: 66 years     Sex: Female     Is Non-Hispanic African American: No     Diabetic: No     Tobacco smoker: No     Systolic Blood Pressure: 130 mmHg     Is BP treated: No     HDL Cholesterol: 39.8 mg/dL     Total Cholesterol: 266 mg/dL    Dispo: 6 months or sooner as needed  Signed, Jodelle Red, MD   Jodelle Red, MD, PhD, [facility]  [facility]  [place]  [facility]  [facility] at [facility] at [facility] [address] [place], [place] [address] [phone]

## 2023-08-22 DIAGNOSIS — D0439 Carcinoma in situ of skin of other parts of face: Secondary | ICD-10-CM | POA: Diagnosis not present

## 2023-09-03 ENCOUNTER — Other Ambulatory Visit (HOSPITAL_BASED_OUTPATIENT_CLINIC_OR_DEPARTMENT_OTHER): Payer: Self-pay

## 2023-09-09 ENCOUNTER — Other Ambulatory Visit (HOSPITAL_BASED_OUTPATIENT_CLINIC_OR_DEPARTMENT_OTHER): Payer: Self-pay

## 2023-09-11 DIAGNOSIS — L578 Other skin changes due to chronic exposure to nonionizing radiation: Secondary | ICD-10-CM | POA: Diagnosis not present

## 2023-09-11 DIAGNOSIS — Z85828 Personal history of other malignant neoplasm of skin: Secondary | ICD-10-CM | POA: Diagnosis not present

## 2023-09-11 DIAGNOSIS — D2261 Melanocytic nevi of right upper limb, including shoulder: Secondary | ICD-10-CM | POA: Diagnosis not present

## 2023-09-11 DIAGNOSIS — L738 Other specified follicular disorders: Secondary | ICD-10-CM | POA: Diagnosis not present

## 2023-09-11 DIAGNOSIS — C44619 Basal cell carcinoma of skin of left upper limb, including shoulder: Secondary | ICD-10-CM | POA: Diagnosis not present

## 2023-09-11 DIAGNOSIS — D2271 Melanocytic nevi of right lower limb, including hip: Secondary | ICD-10-CM | POA: Diagnosis not present

## 2023-09-11 DIAGNOSIS — D485 Neoplasm of uncertain behavior of skin: Secondary | ICD-10-CM | POA: Diagnosis not present

## 2023-09-11 DIAGNOSIS — Z86018 Personal history of other benign neoplasm: Secondary | ICD-10-CM | POA: Diagnosis not present

## 2023-09-11 DIAGNOSIS — L821 Other seborrheic keratosis: Secondary | ICD-10-CM | POA: Diagnosis not present

## 2023-09-11 DIAGNOSIS — D225 Melanocytic nevi of trunk: Secondary | ICD-10-CM | POA: Diagnosis not present

## 2023-11-26 DIAGNOSIS — C44619 Basal cell carcinoma of skin of left upper limb, including shoulder: Secondary | ICD-10-CM | POA: Diagnosis not present

## 2023-11-30 ENCOUNTER — Other Ambulatory Visit: Payer: Self-pay | Admitting: Family Medicine

## 2023-12-02 ENCOUNTER — Other Ambulatory Visit (HOSPITAL_BASED_OUTPATIENT_CLINIC_OR_DEPARTMENT_OTHER): Payer: Self-pay

## 2023-12-02 MED ORDER — ROSUVASTATIN CALCIUM 10 MG PO TABS
10.0000 mg | ORAL_TABLET | Freq: Every day | ORAL | 0 refills | Status: DC
  Filled 2023-12-02: qty 90, 90d supply, fill #0

## 2023-12-07 ENCOUNTER — Other Ambulatory Visit (HOSPITAL_BASED_OUTPATIENT_CLINIC_OR_DEPARTMENT_OTHER): Payer: Self-pay

## 2024-01-13 ENCOUNTER — Encounter (HOSPITAL_BASED_OUTPATIENT_CLINIC_OR_DEPARTMENT_OTHER): Payer: Self-pay | Admitting: Nurse Practitioner

## 2024-01-13 ENCOUNTER — Ambulatory Visit (INDEPENDENT_AMBULATORY_CARE_PROVIDER_SITE_OTHER): Admitting: Nurse Practitioner

## 2024-01-13 ENCOUNTER — Ambulatory Visit (HOSPITAL_BASED_OUTPATIENT_CLINIC_OR_DEPARTMENT_OTHER): Payer: 59 | Admitting: Cardiology

## 2024-01-13 VITALS — BP 102/68 | HR 76 | Ht 64.0 in | Wt 135.0 lb

## 2024-01-13 DIAGNOSIS — I471 Supraventricular tachycardia, unspecified: Secondary | ICD-10-CM

## 2024-01-13 DIAGNOSIS — R002 Palpitations: Secondary | ICD-10-CM

## 2024-01-13 DIAGNOSIS — R0989 Other specified symptoms and signs involving the circulatory and respiratory systems: Secondary | ICD-10-CM

## 2024-01-13 DIAGNOSIS — Z7189 Other specified counseling: Secondary | ICD-10-CM | POA: Diagnosis not present

## 2024-01-13 NOTE — Patient Instructions (Signed)
 Medication Instructions:   Your physician recommends that you continue on your current medications as directed. Please refer to the Current Medication list given to you today.   *If you need a refill on your cardiac medications before your next appointment, please call your pharmacy*  Lab Work:   Your physician recommends that you return for a FASTING LPA/NMR/ALT, fasting after midnight at your convenience. Paperwork given to patient today.     If you have labs (blood work) drawn today and your tests are completely normal, you will receive your results only by: MyChart Message (if you have MyChart) OR A paper copy in the mail If you have any lab test that is abnormal or we need to change your treatment, we will call you to review the results.  Testing/Procedures:  Your physician has requested that you have a carotid duplex. This test is an ultrasound of the carotid arteries in your neck. It looks at blood flow through these arteries that supply the brain with blood. Allow one hour for this exam. There are no restrictions or special instructions.   Follow-Up: At [facility], you and your health needs are our priority.  As part of our continuing mission to provide you with exceptional heart care, our providers are all part of one team.  This team includes your primary Cardiologist (physician) and Advanced Practice Providers or APPs (Physician Assistants and Nurse Practitioners) who all work together to provide you with the care you need, when you need it.  Your next appointment:   1 year(s)  Provider:   Sheryle Donning, MD, Slater Duncan, NP, or Neomi Banks, NP    We recommend signing up for the patient portal called "MyChart".  Sign up information is provided on this After Visit Summary.  MyChart is used to connect with patients for Virtual Visits (Telemedicine).  Patients are able to view lab/test results, encounter notes, upcoming appointments, etc.  Non-urgent  messages can be sent to your provider as well.   To learn more about what you can do with MyChart, go to [email or web address].   Other Instructions  Your physician wants you to follow-up in: 1 year.  You will receive a reminder letter in the mail two months in advance. If you don't receive a letter, please call our office to schedule the follow-up appointment.  Adopting a Healthy Lifestyle.   Weight: Know what a healthy weight is for you (roughly BMI <25) and aim to maintain this. You can calculate your body mass index on your smart phone. Unfortunately, this is not the most accurate measure of healthy weight, but it is the simplest measurement to use. A more accurate measurement involves body scanning which measures lean muscle, fat tissue and bony density. We do not have this equipment at [facility].    Diet: Aim for 7+ servings of fruits and vegetables daily Limit animal fats in diet for cholesterol and heart health - choose grass fed whenever available Avoid highly processed foods (fast food burgers, tacos, fried chicken, pizza, hot dogs, french fries)  Saturated fat comes in the form of butter, lard, coconut oil, margarine, partially hydrogenated oils, and fat in meat. These increase your risk of cardiovascular disease.  Use healthy plant oils, such as olive, canola, soy, corn, sunflower and peanut.  Whole foods such as fruits, vegetables and whole grains have fiber  Men need > 38 grams of fiber per day Women need > 25 grams of fiber per day  Load up on vegetables and fruits -  one-half of your plate: Aim for color and variety, and remember that potatoes dont count. Go for whole grains - one-quarter of your plate: Whole wheat, barley, wheat berries, quinoa, oats, brown rice, and foods made with them. If you want pasta, go with whole wheat pasta. Protein power - one-quarter of your plate: Fish, chicken, beans, and nuts are all healthy, versatile protein sources. Limit red meat. You need  carbohydrates for energy! The type of carbohydrate is more important than the amount. Choose carbohydrates such as vegetables, fruits, whole grains, beans, and nuts in the place of white rice, white pasta, potatoes (baked or fried), macaroni and cheese, cakes, cookies, and donuts.  If youre thirsty, drink water. Coffee and tea are good in moderation, but skip sugary drinks and limit milk and dairy products to one or two daily servings. Keep sugar intake at 6 teaspoons or 24 grams or LESS       Exercise: Aim for 150 min of moderate intensity exercise weekly for heart health, and weights twice weekly for bone health Stay active - any steps are better than no steps! Aim for 7-9 hours of sleep daily

## 2024-01-13 NOTE — Progress Notes (Signed)
 Cardiology Office Note   Date:  01/13/2024  ID:  Barbara Carlson, DOB 11-26-1960, MRN [id number] PCP: Marquetta Sit, MD  [facility] Providers Cardiologist:  Sheryle Donning, MD     PMH SVT Hyperlipidemia Costochondritis Asthma Basal cell carcinoma Calcium  score of 0 on 09/2022  Initially presented to [facility] ED 11/27/2018 for after onset of SVT episode that was not resolved with Valsalva maneuvers.  EKG showed SVT at 150 bpm.  This lasted for 2 hours and was longest episode since onset of SVT in 2010.  She is retired but remains active with home projects.  Avoiding caffeine.  Last cardiology clinic visit was 07/30/2023 with Dr. Veryl Gottron.  She reported intermittent SVT which she controlled with Valsalva.  Was not taking as needed diltiazem .  She continued metoprolol  succinate 50 mg daily.  She was advised to return in 6 months for follow-up.  History of Present Illness History of Present Illness Barbara Carlson is a very pleasant 63 year old female who presents today for follow-up of  supraventricular tachycardia. 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 She reports approximately 3 episodes of supraventricular tachycardia triggered by stress, with the most recent episode in February. These episodes resolve with Valsalva maneuver and relaxation techniques. She takes metoprolol  daily and has not required Cardizem  recently. Her diet has significantly improved recently due to husband's diagnosis of diabetes. She has history of hypertriglyceridemia and hyperlipidemia and has resumed rosuvastatin  after a brief discontinuation, with her direct LDL currently at 117. She reports some physical limitations due to chronic neck and back pain, but she walks regularly for exercise. We discussed consideration of incorporating some resistance training.  She denies chest pain, shortness of breath, orthopnea, PND, edema, presyncope,  syncope.   ROS: See HPI  Studies Reviewed EKG Interpretation Date/Time:  Monday January 13 2024 11:10:45 EDT Ventricular Rate:  73 PR Interval:  152 QRS Duration:  82 QT Interval:  380 QTC Calculation: 418 R Axis:   80  Text Interpretation: Normal sinus rhythm Normal ECG Confirmed by Slater Duncan [phone]) on 01/13/2024 11:14:11 AM     No results found for: "LIPOA"  Risk Assessment/Calculations           Physical Exam VS:  BP 102/68 (BP Location: Left Arm, Patient Position: Sitting, Cuff Size: Normal)   Pulse 76   Ht 5\' 4"  (1.626 m)   Wt 135 lb (61.2 kg)   LMP 09/05/2010   SpO2 98%   BMI 23.17 kg/m    Wt Readings from Last 3 Encounters:  01/13/24 135 lb (61.2 kg)  07/30/23 143 lb (64.9 kg)  03/06/23 146 lb 6.4 oz (66.4 kg)    GEN: Well nourished, well developed in no acute distress NECK: No JVD; No carotid bruits CARDIAC: RRR, no murmurs, rubs, gallops RESPIRATORY:  Clear to auscultation without rales, wheezing or rhonchi  ABDOMEN: Soft, non-tender, non-distended EXTREMITIES:  No edema; No deformity   ASSESSMENT AND PLAN Assessment & Plan Supraventricular Tachycardia (SVT)   Intermittent SVT episodes, approximately 3 episodes over the past 6 months. These episodes resolve with vagal maneuvers. No presyncope or syncope. She has not used Cardizem  recently. Continue metoprolol  daily and use vagal maneuvers for SVT episodes.   Carotid Artery Bruit She has bilateral carotid bruit on exam. She notes occasional lightheadedness when turning her head which she always attributed to cervical stenosis. No acute symptoms concerning for TIA or stroke. We will get  carotid ultrasound to evaluate for stenosis.  Advised she should consider starting aspirin  81 mg if there is carotid stenosis. Continue rosuvastatin .  Hyperlipidemia  Direct LDL is slightly elevated at 117, triglycerides 459 on labs completed 03/07/23. She was off statin at this time but resumed rosuvastatin  10 mg  following these results. No adverse side effects. CoQ10 was discussed; it is not harmful but unproven for cholesterol improvement. NMR lipoprofile and lipoprotein A testing were also discussed, she would like to get these tests due to family history. We will get NMR lipoprofile and Lp(a) today. Continue rosuvastatin . Consider CoQ10 supplementation. Continue heart healthy diet and regular exercise.   Cardiac Risk Assessment We discussed increased risk of ASCVD with age and hyperlipidemia. BP is well controlled. No history of smoking or diabetes. We will get NMR lipoprofile and LP(a) for further risk assessment. She denies chest pain, dyspnea, or other symptoms concerning for angina. Checking carotid u/s secondary to carotid bruit on exam.  No indication for further ischemic evaluation at this time. Continue rosuvastatin  along with heart healthy mostly plant based diet avoiding saturated fat, processed foods, simple carbohydrates, and sugar along with aiming for at least 150 minutes of moderate intensity exercise each week.          Disposition: 1 year with Dr. Veryl Gottron or APP  Signed, Slater Duncan, NP-C

## 2024-01-23 LAB — LIPOPROTEIN A (LPA): Lipoprotein (a): <8.4 nmol/L (ref <75.0)

## 2024-01-23 LAB — NMR, LIPOPROFILE
Cholesterol, Total: 155 mg/dL (ref 100–199)
HDL Particle Number: 37 umol/L (ref >=30.5)
HDL-C: 40 mg/dL (ref >39)
LDL Particle Number: 1225 nmol/L — ABNORMAL HIGH (ref <1000)
LDL Size: 19.8 nm — ABNORMAL LOW (ref >20.5)
LDL-C (NIH Calc): 87 mg/dL (ref 0–99)
LP-IR Score: 57 — ABNORMAL HIGH (ref <=45)
Small LDL Particle Number: 916 nmol/L — ABNORMAL HIGH (ref <=527)
Triglycerides: 160 mg/dL — ABNORMAL HIGH (ref 0–149)

## 2024-01-23 LAB — ALT: ALT: 14 IU/L (ref 0–32)

## 2024-01-24 ENCOUNTER — Ambulatory Visit (HOSPITAL_BASED_OUTPATIENT_CLINIC_OR_DEPARTMENT_OTHER): Payer: Self-pay | Admitting: Nurse Practitioner

## 2024-02-04 DIAGNOSIS — H524 Presbyopia: Secondary | ICD-10-CM | POA: Diagnosis not present

## 2024-02-05 ENCOUNTER — Ambulatory Visit (HOSPITAL_COMMUNITY)
Admission: RE | Admit: 2024-02-05 | Discharge: 2024-02-05 | Disposition: A | Discharge status: Home / Self Care | Source: Ambulatory Visit | Attending: Nurse Practitioner | Admitting: Nurse Practitioner

## 2024-02-05 DIAGNOSIS — I471 Supraventricular tachycardia, unspecified: Secondary | ICD-10-CM | POA: Diagnosis not present

## 2024-02-05 DIAGNOSIS — R0989 Other specified symptoms and signs involving the circulatory and respiratory systems: Secondary | ICD-10-CM

## 2024-02-05 DIAGNOSIS — R002 Palpitations: Secondary | ICD-10-CM | POA: Diagnosis not present

## 2024-02-11 ENCOUNTER — Ambulatory Visit
Admission: EM | Admit: 2024-02-11 | Discharge: 2024-02-11 | Disposition: A | Discharge status: Home / Self Care | Attending: Family Medicine | Admitting: Family Medicine

## 2024-02-11 ENCOUNTER — Encounter (HOSPITAL_BASED_OUTPATIENT_CLINIC_OR_DEPARTMENT_OTHER)

## 2024-02-11 DIAGNOSIS — M79645 Pain in left finger(s): Secondary | ICD-10-CM

## 2024-02-11 DIAGNOSIS — S61213A Laceration without foreign body of left middle finger without damage to nail, initial encounter: Secondary | ICD-10-CM

## 2024-02-11 NOTE — ED Provider Notes (Signed)
 [facility]  Note:  This document was prepared using Conservation officer, historic buildings and may include unintentional dictation errors.  MRN: [id number] DOB: 07/13/1961  Subjective:   Barbara Carlson is a 63 y.o. female presenting for suffering a left middle finger laceration today using electric clippers.  Patient rinse her finger and came straight to our clinic.  Tdap is up-to-date.  No current facility-administered medications for this encounter.  Current Outpatient Medications:  .  Calcium  Carb-Cholecalciferol (CALCIUM  600 + D PO), Take by mouth daily., Disp: , Rfl:  .  Cholecalciferol (VITAMIN D3) 125 MCG (5000 UT) CAPS, Take 1 capsule by mouth 3 (three) times a week., Disp: , Rfl:  .  cyanocobalamin 50 MCG tablet, Take 50 mcg by mouth daily., Disp: , Rfl:  .  diltiazem  (CARDIZEM ) 30 MG tablet, Take 1 tablet (30 mg total) by mouth every 6 (six) hours as needed (SVT)., Disp: 30 tablet, Rfl: 11 .  ibuprofen (ADVIL) 200 MG tablet, Take 200 mg by mouth every 6 (six) hours as needed., Disp: , Rfl:  .  levocetirizine (XYZAL ) 5 MG tablet, Take 1 tablet (5 mg total) by mouth daily., Disp: 90 tablet, Rfl: 3 .  metoprolol  succinate (TOPROL -XL) 50 MG 24 hr tablet, Take 1 tablet (50 mg total) by mouth daily. Take with or immediately following a meal., Disp: 90 tablet, Rfl: 3 .  Multiple Vitamins-Calcium  (ONE-A-DAY WOMENS PO), Take by mouth daily., Disp: , Rfl:  .  rosuvastatin  (CRESTOR ) 10 MG tablet, Take 1 tablet (10 mg total) by mouth daily., Disp: 90 tablet, Rfl: 0 .  vitamin C (ASCORBIC ACID) 500 MG tablet, Take 500 mg by mouth daily., Disp: , Rfl:    No Known Allergies  Past Medical History:  Diagnosis Date  . Asthma    as a child  . Basal cell carcinoma   . Costochondritis, acute    couple years ago  . Hyperlipidemia    diet controlled  . Other dyspnea and respiratory abnormality   . Palpitations   . Post-operative nausea and vomiting   . Tachycardia,  unspecified      Past Surgical History:  Procedure Laterality Date  . BASAL CELL CARCINOMA EXCISION  2004   right  abd  . CERVIX LESION DESTRUCTION     cryotherapy of cervix treated by Dr. Darina  . COLONOSCOPY    . GANGLION CYST EXCISION  2015   right hand  . REFRACTIVE SURGERY  2003   Bil  . TONSILLECTOMY Bilateral 1987    Family History  Problem Relation Age of Onset  . Heart disease Mother        Atrial fibrillation  . Colon polyps Mother   . Cancer Father 68       lung  . Hyperlipidemia Father   . Hypertension Father   . Colon cancer Maternal Aunt   . Colon cancer Paternal Aunt   . Breast cancer Neg Hx   . Esophageal cancer Neg Hx   . Rectal cancer Neg Hx   . Stomach cancer Neg Hx     Social History   Tobacco Use  . Smoking status: Never  . Smokeless tobacco: Never  Vaping Use  . Vaping status: Never Used  Substance Use Topics  . Alcohol use: No  . Drug use: No    ROS   Objective:   Vitals: BP 101/67 (BP Location: Left Arm)   Pulse 76   Temp 97.9 F (36.6 C) (Oral)  Resp 16   LMP 09/05/2010   SpO2 95%   Physical Exam Constitutional:      General: She is not in acute distress.    Appearance: Normal appearance. She is well-developed. She is not ill-appearing, toxic-appearing or diaphoretic.  HENT:     Head: Normocephalic and atraumatic.     Nose: Nose normal.     Mouth/Throat:     Mouth: Mucous membranes are moist.   Eyes:     General: No scleral icterus.       Right eye: No discharge.        Left eye: No discharge.     Extraocular Movements: Extraocular movements intact.    Cardiovascular:     Rate and Rhythm: Normal rate.  Pulmonary:     Effort: Pulmonary effort is normal.   Musculoskeletal:       Hands:   Skin:    General: Skin is warm and dry.   Neurological:     General: No focal deficit present.     Mental Status: She is alert and oriented to person, place, and time.   Psychiatric:        Mood and Affect: Mood  normal.        Behavior: Behavior normal.    PROCEDURE NOTE: laceration repair Verbal consent obtained from patient.  Local anesthesia with 1cc Lidocaine  2% with epinephrine.  Wound explored for tendon, ligament damage. Wound scrubbed with soap and water and rinsed. Wound closed with #4 5-0 Prolene (simple interrupted) sutures.  Wound cleansed and dressed.  Brisk cap refill following laceration repair.  Assessment and Plan :   PDMP not reviewed this encounter.  1. Finger pain, left   2. Laceration of left middle finger without foreign body without damage to nail, initial encounter    Tdap is up-to-date.  Laceration repaired successfully. Wound care reviewed. Recommended Tylenol  and/or ibuprofen for pain control. Return-to-clinic precautions discussed, patient verbalized understanding. Otherwise, follow up in 10 days for suture removal. Counseled patient on potential for adverse effects with medications prescribed/recommended today, ER and return-to-clinic precautions discussed, patient verbalized understanding.    Christopher Savannah, PA-C 02/11/24 1250

## 2024-02-11 NOTE — Discharge Instructions (Signed)
 You can remove your dressing tonight before bed.  Wash the wound, redress it before going back to bed.  In general, monitor for fever, redness, pus, bleeding, swelling as these are signs of wound infection.  If this develops then please return to our clinic for recheck.  Otherwise we can see you back in 10 days for removing the stitches.

## 2024-02-11 NOTE — ED Triage Notes (Signed)
 Pt cut left middle finger on electric clippers ~61min PTA-NAD-steady gait

## 2024-02-21 ENCOUNTER — Encounter (HOSPITAL_BASED_OUTPATIENT_CLINIC_OR_DEPARTMENT_OTHER)

## 2024-02-27 DIAGNOSIS — L578 Other skin changes due to chronic exposure to nonionizing radiation: Secondary | ICD-10-CM | POA: Diagnosis not present

## 2024-02-27 DIAGNOSIS — D225 Melanocytic nevi of trunk: Secondary | ICD-10-CM | POA: Diagnosis not present

## 2024-02-27 DIAGNOSIS — Z85828 Personal history of other malignant neoplasm of skin: Secondary | ICD-10-CM | POA: Diagnosis not present

## 2024-02-27 DIAGNOSIS — D2261 Melanocytic nevi of right upper limb, including shoulder: Secondary | ICD-10-CM | POA: Diagnosis not present

## 2024-02-27 DIAGNOSIS — D2271 Melanocytic nevi of right lower limb, including hip: Secondary | ICD-10-CM | POA: Diagnosis not present

## 2024-02-27 DIAGNOSIS — L570 Actinic keratosis: Secondary | ICD-10-CM | POA: Diagnosis not present

## 2024-02-27 DIAGNOSIS — Z86018 Personal history of other benign neoplasm: Secondary | ICD-10-CM | POA: Diagnosis not present

## 2024-02-27 DIAGNOSIS — L821 Other seborrheic keratosis: Secondary | ICD-10-CM | POA: Diagnosis not present

## 2024-03-02 ENCOUNTER — Other Ambulatory Visit: Payer: Self-pay

## 2024-03-02 ENCOUNTER — Other Ambulatory Visit: Payer: Self-pay | Admitting: Family Medicine

## 2024-03-02 ENCOUNTER — Other Ambulatory Visit (HOSPITAL_BASED_OUTPATIENT_CLINIC_OR_DEPARTMENT_OTHER): Payer: Self-pay

## 2024-03-02 MED ORDER — METOPROLOL SUCCINATE ER 50 MG PO TB24
50.0000 mg | ORAL_TABLET | Freq: Every day | ORAL | 0 refills | Status: DC
  Filled 2024-03-02: qty 30, 30d supply, fill #0

## 2024-03-02 MED ORDER — ROSUVASTATIN CALCIUM 10 MG PO TABS
10.0000 mg | ORAL_TABLET | Freq: Every day | ORAL | 0 refills | Status: DC
  Filled 2024-03-02: qty 30, 30d supply, fill #0

## 2024-03-09 ENCOUNTER — Ambulatory Visit: Payer: Self-pay | Admitting: Family Medicine

## 2024-03-09 ENCOUNTER — Ambulatory Visit (INDEPENDENT_AMBULATORY_CARE_PROVIDER_SITE_OTHER): Admitting: Family Medicine

## 2024-03-09 ENCOUNTER — Other Ambulatory Visit (HOSPITAL_BASED_OUTPATIENT_CLINIC_OR_DEPARTMENT_OTHER): Payer: Self-pay

## 2024-03-09 VITALS — BP 100/60 | HR 74 | Temp 98.0°F | Ht 63.39 in | Wt 131.4 lb

## 2024-03-09 DIAGNOSIS — Z0000 Encounter for general adult medical examination without abnormal findings: Secondary | ICD-10-CM | POA: Diagnosis not present

## 2024-03-09 LAB — CBC WITH DIFFERENTIAL/PLATELET
Basophils Absolute: 0 K/uL (ref 0.0–0.1)
Basophils Relative: 0.3 % (ref 0.0–3.0)
Eosinophils Absolute: 0.2 K/uL (ref 0.0–0.7)
Eosinophils Relative: 3.9 % (ref 0.0–5.0)
HCT: 39 % (ref 36.0–46.0)
Hemoglobin: 13.1 g/dL (ref 12.0–15.0)
Lymphocytes Relative: 39.2 % (ref 12.0–46.0)
Lymphs Abs: 1.9 K/uL (ref 0.7–4.0)
MCHC: 33.5 g/dL (ref 30.0–36.0)
MCV: 88.8 fl (ref 78.0–100.0)
Monocytes Absolute: 0.4 K/uL (ref 0.1–1.0)
Monocytes Relative: 7.4 % (ref 3.0–12.0)
Neutro Abs: 2.3 K/uL (ref 1.4–7.7)
Neutrophils Relative %: 49.2 % (ref 43.0–77.0)
Platelets: 247 K/uL (ref 150.0–400.0)
RBC: 4.39 Mil/uL (ref 3.87–5.11)
RDW: 12.3 % (ref 11.5–15.5)
WBC: 4.8 K/uL (ref 4.0–10.5)

## 2024-03-09 LAB — LIPID PANEL
Cholesterol: 149 mg/dL (ref 0–200)
HDL: 42.8 mg/dL
LDL Cholesterol: 78 mg/dL (ref 0–99)
NonHDL: 105.93
Total CHOL/HDL Ratio: 3
Triglycerides: 140 mg/dL (ref 0.0–149.0)
VLDL: 28 mg/dL (ref 0.0–40.0)

## 2024-03-09 LAB — BASIC METABOLIC PANEL WITH GFR
BUN: 24 mg/dL — ABNORMAL HIGH (ref 6–23)
CO2: 29 meq/L (ref 19–32)
Calcium: 9.6 mg/dL (ref 8.4–10.5)
Chloride: 105 meq/L (ref 96–112)
Creatinine, Ser: 0.78 mg/dL (ref 0.40–1.20)
GFR: 81.45 mL/min (ref >60.00)
Glucose, Bld: 101 mg/dL — ABNORMAL HIGH (ref 70–99)
Potassium: 4.2 meq/L (ref 3.5–5.1)
Sodium: 140 meq/L (ref 135–145)

## 2024-03-09 LAB — HEPATIC FUNCTION PANEL
ALT: 16 U/L (ref 0–35)
AST: 17 U/L (ref 0–37)
Albumin: 4.6 g/dL (ref 3.5–5.2)
Alkaline Phosphatase: 67 U/L (ref 39–117)
Bilirubin, Direct: 0.1 mg/dL (ref 0.0–0.3)
Total Bilirubin: 0.9 mg/dL (ref 0.2–1.2)
Total Protein: 7.3 g/dL (ref 6.0–8.3)

## 2024-03-09 LAB — HEMOGLOBIN A1C: Hgb A1c MFr Bld: 6 % (ref 4.6–6.5)

## 2024-03-09 MED ORDER — ROSUVASTATIN CALCIUM 10 MG PO TABS
10.0000 mg | ORAL_TABLET | Freq: Every day | ORAL | 3 refills | Status: AC
  Filled 2024-03-09 – 2024-03-31 (×2): qty 90, 90d supply, fill #0
  Filled 2024-07-02: qty 90, 90d supply, fill #1

## 2024-03-09 MED ORDER — METOPROLOL SUCCINATE ER 50 MG PO TB24
50.0000 mg | ORAL_TABLET | Freq: Every day | ORAL | 3 refills | Status: AC
  Filled 2024-03-09 – 2024-03-31 (×2): qty 90, 90d supply, fill #0
  Filled 2024-07-02: qty 90, 90d supply, fill #1

## 2024-03-09 NOTE — Progress Notes (Signed)
 Established Patient Office Visit  Subjective   Patient ID: Barbara Carlson, female    DOB: Jun 01, 1961  Age: 63 y.o. MRN: [id number]  Chief Complaint  Patient presents with   Annual Exam    HPI   Barbara Carlson is seen for physical exam.  She has history of frequent PVCs and supraventricular tachycardia.  Also has history of hyperlipidemia.  She is currently on rosuvastatin  10 mg daily and also takes Toprol -XL 50 mg daily.  Recently saw cardiology.  Was noted to have possible carotid bruit.  Carotid Dopplers were unremarkable.  No recent TIA type symptoms.  Had recent LP(a) score of less than 9.  Did have NMR profile with small LDL particles.  Recent coronary calcium  score of 0.  Generally doing well.  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  Health maintenance reviewed:  Health Maintenance  Topic Date Due   HIV Screening  Never done   COVID-19 Vaccine (3 - Pfizer risk series) 10/06/2019   INFLUENZA VACCINE  03/13/2024   MAMMOGRAM  07/15/2025   Colonoscopy  10/25/2025   Cervical Cancer Screening (HPV/Pap Cotest)  11/07/2026   DTaP/Tdap/Td (3 - Td or Tdap) 02/08/2031   Hepatitis C Screening  Completed   Zoster Vaccines- Shingrix   Completed   Hepatitis B Vaccines  Aged Out   HPV VACCINES  Aged Out   Meningococcal B Vaccine  Aged Out   - Still sees GYN - Colonoscopy due March 2027 - Has completed Shingrix  - Mammogram up-to-date - Has not had pneumonia vaccine and declines at this time but will consider soon  Family history-mother had history of atrial fibrillation and dementia.  She had stroke age 36.  Father died of lung cancer age 8.  2 brothers with hypertension.  Social history-married.  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  No children.  Never smoked.  No alcohol.  Past Medical History:  Diagnosis Date   Asthma    as a child   Basal cell  carcinoma    Costochondritis, acute    couple years ago   Hyperlipidemia    diet controlled   Other dyspnea and respiratory abnormality    Palpitations    Post-operative nausea and vomiting    Tachycardia, unspecified    Past Surgical History:  Procedure Laterality Date   BASAL CELL CARCINOMA EXCISION  2004   right  abd   CERVIX LESION DESTRUCTION     cryotherapy of cervix treated by Dr. Darina   COLONOSCOPY     GANGLION CYST EXCISION  2015   right hand   REFRACTIVE SURGERY  2003   Bil   TONSILLECTOMY Bilateral 1987    reports that she has never smoked. She has never used smokeless tobacco. She reports that she does not drink alcohol and does not use drugs. family history includes Cancer (age of onset: 68) in her father; Colon cancer in her maternal aunt and paternal aunt; Colon polyps in her mother; Heart disease in her mother; Hyperlipidemia in her father; Hypertension in her father. No Known Allergies    Review of Systems  Constitutional:  Negative for chills, fever, malaise/fatigue and weight loss.  HENT:  Negative for hearing loss.   Eyes:  Negative for blurred vision and double vision.  Respiratory:  Negative for cough and shortness of breath.   Cardiovascular:  Negative for chest pain and leg swelling.  Gastrointestinal:  Negative for abdominal pain, blood in stool, constipation and diarrhea.  Genitourinary:  Negative for dysuria.  Skin:  Negative for rash.  Neurological:  Negative for dizziness, speech change, seizures, loss of consciousness and headaches.  Psychiatric/Behavioral:  Negative for depression.       Objective:     BP 100/60 (BP Location: Left Arm, Patient Position: Sitting, Cuff Size: Normal)   Pulse 74   Temp 98 F (36.7 C) (Oral)   Ht 5' 3.39 (1.61 m)   Wt 131 lb 6.4 oz (59.6 kg)   LMP 09/05/2010   SpO2 97%   BMI 22.99 kg/m  BP Readings from Last 3 Encounters:  03/09/24 100/60  02/11/24 101/67  01/13/24 102/68   Wt Readings from Last  3 Encounters:  03/09/24 131 lb 6.4 oz (59.6 kg)  01/13/24 135 lb (61.2 kg)  07/30/23 143 lb (64.9 kg)      Physical Exam Vitals reviewed.  Constitutional:      General: She is not in acute distress.    Appearance: She is well-developed. She is not ill-appearing.  HENT:     Right Ear: Tympanic membrane normal.     Left Ear: Tympanic membrane normal.  Eyes:     Pupils: Pupils are equal, round, and reactive to light.  Neck:     Thyroid : No thyromegaly.     Vascular: No JVD.  Cardiovascular:     Rate and Rhythm: Normal rate and regular rhythm.     Heart sounds:     No gallop.  Pulmonary:     Effort: Pulmonary effort is normal. No respiratory distress.     Breath sounds: Normal breath sounds. No wheezing or rales.  Abdominal:     Palpations: Abdomen is soft.     Tenderness: There is no abdominal tenderness.  Musculoskeletal:     Cervical back: Neck supple.     Right lower leg: No edema.     Left lower leg: No edema.  Neurological:     Mental Status: She is alert.      No results found for any visits on 03/09/24.    The 10-year ASCVD risk score (Arnett DK, et al., 2019) is: 2.6%    Assessment & Plan:   Problem List Items Addressed This Visit   None Visit Diagnoses       Physical exam    -  Primary   Relevant Orders   Basic metabolic panel with GFR   Lipid panel   CBC with Differential/Platelet   Hepatic function panel   Hemoglobin A1c     Generally healthy 63 year old female.  She has history of mild hyperlipidemia.  Recent low LP(a) and recent coronary calcium  score of 0.  We discussed the following health maintenance items  -Continue annual flu vaccine - Consider Prevnar 20.  Discussed recent change in guidelines regarding pneumonia vaccination - Colonoscopy due 3/27 - Continue regular exercise with goal of 150 minutes/week minimum of moderate intensity exercise - She plans to continue with GYN follow-up for Pap smear and mammograms. - Check labs as  above  No follow-ups on file.    Wolm Scarlet, MD

## 2024-03-09 NOTE — Patient Instructions (Signed)
 Consider Prevnar 20 vaccine at some point this year.

## 2024-03-31 ENCOUNTER — Other Ambulatory Visit (HOSPITAL_BASED_OUTPATIENT_CLINIC_OR_DEPARTMENT_OTHER): Payer: Self-pay

## 2024-06-01 ENCOUNTER — Encounter: Payer: Self-pay | Admitting: Family Medicine

## 2024-06-08 ENCOUNTER — Other Ambulatory Visit: Payer: Self-pay | Admitting: Family Medicine

## 2024-06-09 ENCOUNTER — Other Ambulatory Visit (HOSPITAL_BASED_OUTPATIENT_CLINIC_OR_DEPARTMENT_OTHER): Payer: Self-pay

## 2024-06-09 MED ORDER — LEVOCETIRIZINE DIHYDROCHLORIDE 5 MG PO TABS
5.0000 mg | ORAL_TABLET | Freq: Every day | ORAL | 3 refills | Status: AC
  Filled 2024-06-09: qty 90, 90d supply, fill #0
  Filled 2024-09-11: qty 90, 90d supply, fill #1

## 2024-09-11 ENCOUNTER — Other Ambulatory Visit (HOSPITAL_BASED_OUTPATIENT_CLINIC_OR_DEPARTMENT_OTHER): Payer: Self-pay

## 2024-09-15 ENCOUNTER — Other Ambulatory Visit (HOSPITAL_BASED_OUTPATIENT_CLINIC_OR_DEPARTMENT_OTHER): Payer: Self-pay
